# Patient Record
Sex: Female | Born: 1970
Health system: Southern US, Community
[De-identification: ages and names within clinical notes are randomized; demographics above are authoritative.]

## PROBLEM LIST (undated history)

## (undated) DIAGNOSIS — L502 Urticaria due to cold and heat: Secondary | ICD-10-CM

## (undated) DIAGNOSIS — R011 Cardiac murmur, unspecified: Secondary | ICD-10-CM

## (undated) DIAGNOSIS — J309 Allergic rhinitis, unspecified: Secondary | ICD-10-CM

## (undated) DIAGNOSIS — H1045 Other chronic allergic conjunctivitis: Secondary | ICD-10-CM

## (undated) DIAGNOSIS — E079 Disorder of thyroid, unspecified: Secondary | ICD-10-CM

## (undated) DIAGNOSIS — K219 Gastro-esophageal reflux disease without esophagitis: Secondary | ICD-10-CM

## (undated) DIAGNOSIS — J3089 Other allergic rhinitis: Secondary | ICD-10-CM

## (undated) DIAGNOSIS — L509 Urticaria, unspecified: Secondary | ICD-10-CM

## (undated) DIAGNOSIS — T7840XA Allergy, unspecified, initial encounter: Secondary | ICD-10-CM

## (undated) HISTORY — DX: Gastro-esophageal reflux disease without esophagitis: K21.9

## (undated) HISTORY — PX: MANDIBLE SURGERY: SHX707

## (undated) HISTORY — PX: NASAL SINUS SURGERY: SHX719

## (undated) HISTORY — DX: Urticaria due to cold and heat: L50.2

## (undated) HISTORY — DX: Allergic rhinitis, unspecified: J30.9

## (undated) HISTORY — DX: Disorder of thyroid, unspecified: E07.9

## (undated) HISTORY — DX: Cardiac murmur, unspecified: R01.1

## (undated) HISTORY — DX: Allergy, unspecified, initial encounter: T78.40XA

## (undated) HISTORY — DX: Urticaria, unspecified: L50.9

## (undated) HISTORY — DX: Other chronic allergic conjunctivitis: H10.45

---
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
## 1999-06-02 ENCOUNTER — Other Ambulatory Visit: Admission: RE | Admit: 1999-06-02 | Discharge: 1999-06-02 | Payer: Self-pay | Admitting: Obstetrics and Gynecology

## 1999-12-14 ENCOUNTER — Other Ambulatory Visit: Admission: RE | Admit: 1999-12-14 | Discharge: 1999-12-14 | Payer: Self-pay | Admitting: Obstetrics and Gynecology

## 2001-11-20 ENCOUNTER — Other Ambulatory Visit: Admission: RE | Admit: 2001-11-20 | Discharge: 2001-11-20 | Payer: Self-pay | Admitting: Obstetrics and Gynecology

## 2002-07-19 ENCOUNTER — Emergency Department (HOSPITAL_COMMUNITY): Admission: EM | Admit: 2002-07-19 | Discharge: 2002-07-19 | Payer: Self-pay | Admitting: Emergency Medicine

## 2002-12-18 ENCOUNTER — Other Ambulatory Visit: Admission: RE | Admit: 2002-12-18 | Discharge: 2002-12-18 | Payer: Self-pay | Admitting: Obstetrics and Gynecology

## 2003-05-29 ENCOUNTER — Other Ambulatory Visit: Admission: RE | Admit: 2003-05-29 | Discharge: 2003-05-29 | Payer: Self-pay | Admitting: Obstetrics and Gynecology

## 2003-10-14 ENCOUNTER — Other Ambulatory Visit: Admission: RE | Admit: 2003-10-14 | Discharge: 2003-10-14 | Payer: Self-pay | Admitting: Obstetrics and Gynecology

## 2003-11-11 ENCOUNTER — Inpatient Hospital Stay (HOSPITAL_COMMUNITY): Admission: AD | Admit: 2003-11-11 | Discharge: 2003-11-25 | Payer: Self-pay | Admitting: Obstetrics and Gynecology

## 2003-11-26 ENCOUNTER — Encounter: Admission: RE | Admit: 2003-11-26 | Discharge: 2003-12-26 | Payer: Self-pay | Admitting: Obstetrics and Gynecology

## 2003-12-27 ENCOUNTER — Other Ambulatory Visit: Admission: RE | Admit: 2003-12-27 | Discharge: 2003-12-27 | Payer: Self-pay | Admitting: Obstetrics and Gynecology

## 2004-04-16 ENCOUNTER — Ambulatory Visit: Payer: Self-pay | Admitting: Internal Medicine

## 2004-04-20 ENCOUNTER — Ambulatory Visit: Payer: Self-pay | Admitting: Internal Medicine

## 2004-09-10 ENCOUNTER — Ambulatory Visit: Payer: Self-pay | Admitting: Internal Medicine

## 2004-11-05 ENCOUNTER — Other Ambulatory Visit: Admission: RE | Admit: 2004-11-05 | Discharge: 2004-11-05 | Payer: Self-pay | Admitting: Obstetrics and Gynecology

## 2005-01-20 ENCOUNTER — Inpatient Hospital Stay (HOSPITAL_COMMUNITY): Admission: AD | Admit: 2005-01-20 | Discharge: 2005-01-20 | Payer: Self-pay | Admitting: Obstetrics and Gynecology

## 2005-03-16 ENCOUNTER — Ambulatory Visit: Payer: Self-pay | Admitting: Family Medicine

## 2005-04-28 ENCOUNTER — Inpatient Hospital Stay (HOSPITAL_COMMUNITY): Admission: AD | Admit: 2005-04-28 | Discharge: 2005-04-30 | Payer: Self-pay | Admitting: Obstetrics and Gynecology

## 2005-05-01 ENCOUNTER — Encounter: Admission: RE | Admit: 2005-05-01 | Discharge: 2005-05-28 | Payer: Self-pay | Admitting: Obstetrics and Gynecology

## 2005-05-29 ENCOUNTER — Encounter: Admission: RE | Admit: 2005-05-29 | Discharge: 2005-06-28 | Payer: Self-pay | Admitting: Obstetrics and Gynecology

## 2005-06-03 ENCOUNTER — Other Ambulatory Visit: Admission: RE | Admit: 2005-06-03 | Discharge: 2005-06-03 | Payer: Self-pay | Admitting: Obstetrics and Gynecology

## 2005-06-29 ENCOUNTER — Encounter: Admission: RE | Admit: 2005-06-29 | Discharge: 2005-07-28 | Payer: Self-pay | Admitting: Obstetrics and Gynecology

## 2005-07-29 ENCOUNTER — Encounter: Admission: RE | Admit: 2005-07-29 | Discharge: 2005-08-28 | Payer: Self-pay | Admitting: Obstetrics and Gynecology

## 2005-08-29 ENCOUNTER — Encounter: Admission: RE | Admit: 2005-08-29 | Discharge: 2005-09-27 | Payer: Self-pay | Admitting: Obstetrics and Gynecology

## 2005-09-28 ENCOUNTER — Encounter: Admission: RE | Admit: 2005-09-28 | Discharge: 2005-10-28 | Payer: Self-pay | Admitting: Obstetrics and Gynecology

## 2005-09-29 ENCOUNTER — Ambulatory Visit: Payer: Self-pay | Admitting: Internal Medicine

## 2005-10-29 ENCOUNTER — Encounter: Admission: RE | Admit: 2005-10-29 | Discharge: 2005-11-28 | Payer: Self-pay | Admitting: Obstetrics and Gynecology

## 2005-11-29 ENCOUNTER — Encounter: Admission: RE | Admit: 2005-11-29 | Discharge: 2005-12-02 | Payer: Self-pay | Admitting: Obstetrics and Gynecology

## 2007-09-29 ENCOUNTER — Inpatient Hospital Stay (HOSPITAL_COMMUNITY): Admission: AD | Admit: 2007-09-29 | Discharge: 2007-09-29 | Payer: Self-pay | Admitting: Obstetrics and Gynecology

## 2008-01-29 ENCOUNTER — Inpatient Hospital Stay (HOSPITAL_COMMUNITY): Admission: AD | Admit: 2008-01-29 | Discharge: 2008-02-10 | Payer: Self-pay | Admitting: Obstetrics and Gynecology

## 2008-02-08 ENCOUNTER — Encounter (INDEPENDENT_AMBULATORY_CARE_PROVIDER_SITE_OTHER): Payer: Self-pay | Admitting: Obstetrics and Gynecology

## 2008-02-11 ENCOUNTER — Encounter: Admission: RE | Admit: 2008-02-11 | Discharge: 2008-03-12 | Payer: Self-pay | Admitting: Obstetrics and Gynecology

## 2008-03-13 ENCOUNTER — Encounter: Admission: RE | Admit: 2008-03-13 | Discharge: 2008-04-12 | Payer: Self-pay | Admitting: Obstetrics and Gynecology

## 2008-04-13 ENCOUNTER — Encounter: Admission: RE | Admit: 2008-04-13 | Discharge: 2008-05-06 | Payer: Self-pay | Admitting: Obstetrics and Gynecology

## 2008-07-17 ENCOUNTER — Ambulatory Visit: Payer: Self-pay | Admitting: Family Medicine

## 2008-07-17 DIAGNOSIS — B351 Tinea unguium: Secondary | ICD-10-CM | POA: Insufficient documentation

## 2008-07-17 DIAGNOSIS — Z6828 Body mass index (BMI) 28.0-28.9, adult: Secondary | ICD-10-CM | POA: Insufficient documentation

## 2008-07-30 ENCOUNTER — Ambulatory Visit: Payer: Self-pay | Admitting: Family Medicine

## 2008-07-31 LAB — CONVERTED CEMR LAB
AST: 17 units/L (ref 0–37)
Alkaline Phosphatase: 75 units/L (ref 39–117)
BUN: 9 mg/dL (ref 6–23)
CO2: 29 meq/L (ref 19–32)
Glucose, Bld: 86 mg/dL (ref 70–99)
Potassium: 4.1 meq/L (ref 3.5–5.1)
Total Protein: 6.9 g/dL (ref 6.0–8.3)

## 2009-01-08 ENCOUNTER — Ambulatory Visit: Payer: Self-pay | Admitting: Family Medicine

## 2009-09-24 ENCOUNTER — Ambulatory Visit: Payer: Self-pay | Admitting: Family Medicine

## 2009-09-24 DIAGNOSIS — G8929 Other chronic pain: Secondary | ICD-10-CM | POA: Insufficient documentation

## 2009-09-24 DIAGNOSIS — R1013 Epigastric pain: Secondary | ICD-10-CM

## 2009-09-25 ENCOUNTER — Ambulatory Visit: Payer: Self-pay | Admitting: Family Medicine

## 2009-09-25 DIAGNOSIS — R1011 Right upper quadrant pain: Secondary | ICD-10-CM | POA: Insufficient documentation

## 2009-09-25 LAB — CONVERTED CEMR LAB
AST: 27 units/L (ref 0–37)
BUN: 10 mg/dL (ref 6–23)
Basophils Relative: 0.5 % (ref 0.0–3.0)
Bilirubin, Direct: 0.1 mg/dL (ref 0.0–0.3)
CO2: 27 meq/L (ref 19–32)
Calcium: 8.9 mg/dL (ref 8.4–10.5)
Chloride: 107 meq/L (ref 96–112)
Creatinine, Ser: 0.5 mg/dL (ref 0.4–1.2)
Eosinophils Absolute: 0.2 10*3/uL (ref 0.0–0.7)
Glucose, Bld: 83 mg/dL (ref 70–99)
Hemoglobin: 13 g/dL (ref 12.0–15.0)
Monocytes Relative: 6.3 % (ref 3.0–12.0)
Potassium: 4 meq/L (ref 3.5–5.1)
RDW: 12.7 % (ref 11.5–14.6)
Sodium: 139 meq/L (ref 135–145)
Total Bilirubin: 0.7 mg/dL (ref 0.3–1.2)
Total Protein: 6.5 g/dL (ref 6.0–8.3)

## 2009-09-26 ENCOUNTER — Encounter: Admission: RE | Admit: 2009-09-26 | Discharge: 2009-09-26 | Payer: Self-pay | Admitting: Family Medicine

## 2010-03-29 ENCOUNTER — Encounter: Payer: Self-pay | Admitting: Obstetrics and Gynecology

## 2010-03-30 ENCOUNTER — Encounter: Payer: Self-pay | Admitting: Obstetrics and Gynecology

## 2010-04-09 NOTE — Assessment & Plan Note (Signed)
Summary: BACK PAIN/DLO   Vital Signs:  Patient profile:   40 year old female Height:      66 inches Weight:      185.6 pounds BMI:     30.06 Temp:     98.7 degrees F oral Pulse rate:   84 / minute Pulse rhythm:   regular BP sitting:   110 / 78  (left arm) Cuff size:   large  Vitals Entered By: Benny Lennert CMA Duncan Dull) (September 24, 2009 10:01 AM)  History of Present Illness: Chief complaint Back Pain and upper abdominal pain  1 week ago.. right upper back pain... started after show, but no specific injury...was reaching up some. Using ibuprofen helps some, but temporarily.  Constant  Has also had some upper  abdominal pressure and gas...points to up under central and right rib cage... intermittant  Some feeling of difficulty swallowing when having pain. Notes worse after eating. Had similar..pain in centeral upper abd...2 months ago but went away. Started before she started ibuprofen. Tried pepcid..no improvement     Problems Prior to Update: 1)  Ruq Pain  (ICD-789.01) 2)  Pharyngitis, Streptococcal  (ICD-034.0) 3)  Screening For Lipoid Disorders  (ICD-V77.91) 4)  Obesity, Unspecified  (ICD-278.00) 5)  Onychomycosis, Toenails  (ICD-110.1)  Current Medications (verified): 1)  None  Allergies: 1)  ! Keflex 2)  ! Ceftin  Past History:  Past medical, surgical, family and social histories (including risk factors) reviewed, and no changes noted (except as noted below).  Past Surgical History: Reviewed history from 07/17/2008 and no changes required. nasal polyps and deviated septum surgery 1989  Family History: Reviewed history from 07/17/2008 and no changes required. father: CAD, DM, HTN, high chol mother: DM, HTN, high chol brother: DM, HTN MGF: lymphoma MGM: aneurysm  Social History: Reviewed history from 07/17/2008 and no changes required. 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 Alcohol use-no Drug use-no Regular exercise-no Diet: fruits and veggies, water  Review of Systems General:  Denies fatigue and fever. ENT:  Denies ear discharge, earache, nasal congestion, sinus pressure, and sore throat. CV:  Denies chest pain or discomfort. Resp:  Denies shortness of breath. GI:  Complains of constipation and indigestion; denies bloody stools, diarrhea, nausea, vomiting, and vomiting blood; some indigestion. GU:  Denies dysuria, hematuria, urinary frequency, and urinary hesitancy.  Physical Exam  General:  Well-developed,well-nourished,in no acute distress; alert,appropriate and cooperative throughout examination Mouth:  Oral mucosa and oropharynx without lesions or exudates.  Teeth in good repair. Neck:  no carotid bruit or thyromegaly cervical lymphadenopathy.   Lungs:  Normal respiratory effort, chest expands symmetrically. Lungs are clear to auscultation, no crackles or wheezes. Heart:  Normal rate and regular rhythm. S1 and S2 normal without gallop, murmur, click, rub or other extra sounds. Abdomen:  Bowel sounds positive,abdomen soft and mild epigastric  tenderness, non tender over gallbladder without masses, organomegaly or hernias noted.  NO CVA ttp.  Msk:  ttp over right upper trapezius, no cetral vertebral pain Neurologic:  No cranial nerve deficits noted. Station and gait are normal. Sensory, motor and coordinative functions appear intact.   Impression & Recommendations:  Problem # 1:  EPIGASTRIC PAIN (ICD-789.06) Will eval with labs...start PPI.Marland Kitchenif continues will consider gallbladder eval. Orders: TLB-BMP (Basic Metabolic Panel-BMET) (80048-METABOL) TLB-CBC Platelet - w/Differential (85025-CBCD) TLB-Hepatic/Liver Function Pnl (80076-HEPATIC) TLB-Lipase (83690-LIPASE)  Problem # 2:  MUSCLE SPASM, TRAPEZIUS (ICD-728.85) Treat with tylenol,  gentle stretching, Heat and muscle relaxant as needed. Follow up if otimproving.   Complete  Medication List: 1)  Cyclobenzaprine Hcl 5 Mg Tabs (Cyclobenzaprine hcl) .Marland Kitchen.. 1-2 tab by mouth at bedtime  Patient Instructions: 1)  We will call you with the lab results. 2)   Start 2 tab 20 mg prilosec. 3)  Heat on right upper back, gentle stretching upoper back. 4)  Use tylenol for pain. 5)   MAy use muscle relaxant at night if needed.  6)  Call if pain is worsening.Marland Kitchengo to ER if severe pain. Prescriptions: CYCLOBENZAPRINE HCL 5 MG TABS (CYCLOBENZAPRINE HCL) 1-2 tab by mouth at bedtime  #30 x 0   Entered and Authorized by:   Kerby Nora MD   Signed by:   Kerby Nora MD on 09/24/2009   Method used:   Print then Give to Patient   RxID:   [id number]   Current Allergies (reviewed today): ! [facility] ! CEFTIN

## 2010-04-09 NOTE — Assessment & Plan Note (Signed)
Summary: not feeling any better from dr Ermalene Searing 7/20/rbh   Vital Signs:  Patient profile:   40 year old female Height:      66 inches Weight:      183.4 pounds BMI:     29.71 Temp:     98.6 degrees F oral Pulse rate:   84 / minute Pulse rhythm:   regular BP sitting:   110 / 70  (left arm) Cuff size:   regular  Vitals Entered By: Benny Lennert CMA Duncan Dull) (September 25, 2009 12:38 PM)  History of Present Illness: Chief complaint upper abdomen pain  40 year old female who presents with abd pain and shoulder upper back pain, with the abd pain ongoing for 1 week. Nontoxic, able to eat. Has had some occ gas.  Pressure and pain in the right upper quandrant.  Also has been having a lot of gas and constipation and nothing has come up.   Shoulder, additionally has R upper shoulder discomfort posterior, mostly focally in the trap / rhomboid area  Reviewed all labs and below, as well, lipase normal.  Clinical Review Panels:  CBC   WBC:  9.5 (09/24/2009)   RBC:  4.06 (09/24/2009)   Hgb:  13.0 (09/24/2009)   Hct:  38.2 (09/24/2009)   Platelets:  346.0 (09/24/2009)   MCV  94.0 (09/24/2009)   MCHC  34.0 (09/24/2009)   RDW  12.7 (09/24/2009)   PMN:  65.5 (09/24/2009)   Lymphs:  25.5 (09/24/2009)   Monos:  6.3 (09/24/2009)   Eosinophils:  2.2 (09/24/2009)   Basophil:  0.5 (09/24/2009)  Complete Metabolic Panel   Glucose:  83 (09/24/2009)   Sodium:  139 (09/24/2009)   Potassium:  4.0 (09/24/2009)   Chloride:  107 (09/24/2009)   CO2:  27 (09/24/2009)   BUN:  10 (09/24/2009)   Creatinine:  0.5 (09/24/2009)   Albumin:  3.8 (09/24/2009)   Total Protein:  6.5 (09/24/2009)   Calcium:  8.9 (09/24/2009)   Total Bili:  0.7 (09/24/2009)   Alk Phos:  84 (09/24/2009)   SGPT (ALT):  31 (09/24/2009)   SGOT (AST):  27 (09/24/2009)   Allergies: 1)  ! Keflex 2)  ! Ceftin  Past History:  Past medical, surgical, family and social histories (including risk factors) reviewed, and no changes  noted (except as noted below).  Past Surgical History: Reviewed history from 07/17/2008 and no changes required. nasal polyps and deviated septum surgery 1989   Family History: Reviewed history from 07/17/2008 and no changes required. father: CAD, DM, HTN, high chol mother: DM, HTN, high chol brother: DM, HTN MGF: lymphoma MGM: aneurysm  Social History: Reviewed history from 07/17/2008 and no changes required. 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 Alcohol use-no Drug use-no Regular exercise-no Diet: fruits and veggies, water  Review of Systems  The patient denies fever, chest pain, dyspnea on exertion, peripheral edema, and prolonged cough.    Physical Exam  General:  Well-developed,well-nourished,in no acute distress; alert,appropriate and cooperative throughout examination Head:  Normocephalic and atraumatic without obvious abnormalities. No apparent alopecia or balding. Ears:  no external deformities.   Nose:  no external deformity.   Neck:  No deformities, masses, or tenderness noted. Lungs:  Normal respiratory effort, chest expands symmetrically. Lungs are clear to auscultation, no crackles or wheezes. Heart:  Normal rate and regular rhythm. S1 and S2 normal without gallop, murmur, click, rub or other extra sounds.  Abdomen:  Mildly tender RUQ, without rebound. No distension, no rigidity. soft, normal bowel sounds, no rebound tenderness, no abdominal hernia, no inguinal hernia, no hepatomegaly, and no splenomegaly.   Extremities:  No clubbing, cyanosis, edema, or deformity noted with normal full range of motion of all joints.   Neurologic:  alert & oriented X3 and gait normal.   Cervical Nodes:  No lymphadenopathy noted Psych:  Cognition and judgment appear intact. Alert and cooperative with normal attention span and concentration. No apparent delusions, illusions,  hallucinations   Impression & Recommendations:  Problem # 1:  RUQ PAIN (ICD-789.01) Exam nontoxic but with risk factors could be gallbladder and most prudent to check RUQ [place] to eval gallbladder.   Labs all reviewed, encouraging.    Orders: Radiology Referral (Radiology)  Problem # 2:  MUSCLE SPASM, TRAPEZIUS (ICD-728.85) scapular dyskinesis, reviewed with the patient.  lower scapular stabilizers are notably weak with easily seen winging.  reviewed basic str of this areas with her  Complete Medication List: 1)  Vitamin D 400 Unit Tabs (Cholecalciferol) .... One daily 2)  Zyrtec Allergy 10 Mg Tabs (Cetirizine hcl) .... Otc one daily as needed for allergies 3)  Terbinafine Hcl 250 Mg Tabs (Terbinafine hcl) .... Take 1 tablet by mouth once a day x 12 weeks 4)  Amoxicillin 500 Mg Caps (Amoxicillin) .... 2 tab by mouth two times a day x 10 days 5)  Cyclobenzaprine Hcl 5 Mg Tabs (Cyclobenzaprine hcl) .Marland Kitchen.. 1-2 tab by mouth at bedtime  Patient Instructions: 1)  Referral Appointment Information 2)  Day/Date: 3)  Time: 4)  Place/MD: 5)  Address: 6)  Phone/Fax: 7)  Patient given appointment information. Information/Orders faxed/mailed.   Current Allergies (reviewed today): ! KEFLEX ! CEFTIN

## 2010-05-13 ENCOUNTER — Encounter: Payer: Self-pay | Admitting: Family Medicine

## 2010-05-13 ENCOUNTER — Ambulatory Visit (INDEPENDENT_AMBULATORY_CARE_PROVIDER_SITE_OTHER): Payer: PRIVATE HEALTH INSURANCE | Admitting: Family Medicine

## 2010-05-13 DIAGNOSIS — R1013 Epigastric pain: Secondary | ICD-10-CM

## 2010-05-19 NOTE — Assessment & Plan Note (Signed)
Summary: GAS PAIN/CLE   ASSURANT   Vital Signs:  Patient profile:   40 year old female Weight:      192.75 pounds Temp:     98.1 degrees F oral Pulse rate:   84 / minute Pulse rhythm:   regular BP sitting:   126 / 80  (left arm) Cuff size:   large  Vitals Entered By: Selena Batten Dance CMA Duncan Dull) (May 13, 2010 11:51 AM) CC: Gas pain x2 weeks   History of Present Illness: CC: abd pain  2wk h/o epigastric abd discomfort, worse with any food.  drinking soda and burping or flatus or BM relieves pain.  Feels pressure sensation in abd.  Eating less and more often to help.  + mild nausea with this.  No significant pain.  feeling faint as well.  No LOC.  has tried gas x but didn't really help.  no radiation of pain.  No weight changes, fevers/chills, vomiting, diarrhea.  mild constipation.  No changes in diet recently.  no sick contacts.  started prilosec 20mg  for 5 days now.  doesn't seem to be helping.  similar thing happened 1 1/2 years ago (along with pulled muscle in back).  resolved with time and omeprazole.  abd US wnl back then  Current Medications (verified): 1)  Prilosec Otc 20 Mg Tbec (Omeprazole Magnesium) .Marland Kitchen.. 1 By Mouth Once Daily  Allergies: 1)  ! Keflex 2)  ! 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 Alcohol use-no Drug use-no Regular exercise-no Diet: fruits and veggies, water  Review of Systems       per HPI  Physical Exam  General:  Well-developed,well-nourished,in no acute distress; alert,appropriate and cooperative throughout examination Mouth:  Oral mucosa and oropharynx without lesions or exudates.  Teeth in good repair. Neck:  No deformities, masses, or tenderness noted. Lungs:  Normal respiratory effort, chest expands symmetrically. Lungs are clear to auscultation, no crackles or  wheezes. Heart:  Normal rate and regular rhythm. S1 and S2 normal without gallop, murmur, click, rub or other extra sounds. Abdomen:  Bowel sounds positive,abdomen soft and without masses, organomegaly or hernias noted.  mild epigastric tenderness to deep palpation.  no RUQ pain, no masses at gallbladde.r Pulses:  2+ rad pulses bilaterally Extremities:  no pedal edema   Impression & Recommendations:  Problem # 1:  EPIGASTRIC PAIN (ICD-789.06) dyspepsia.  Sounds gastritis vs GERD related. increase omeprazole to 40 daily. gerd precautions discussed update if not better for further eval.  Complete Medication List: 1)  Omeprazole 40 Mg Cpdr (Omeprazole) .... Take one daily for 3 wks, falied 20mg   Patient Instructions: 1)  this could be some gastritis or reflux or just indigestion. 2)  Take prilosec 40mg  daily (prescription sent in) for 3 weeks. 3)  start beano as well. 4)  Avoidance of citrus, fatty foods, chocolate, peppermint, and excessive alcohol, along with sodas, orange juice (acidic drinks) should help. 5)  At least a few hours between dinner and bed, minimize naps after eating. 6)  return if not better with this treatment or any worsening. Prescriptions: OMEPRAZOLE 40 MG CPDR (OMEPRAZOLE) take one daily for 3 wks, falied 20mg   #30 x 0   Entered and Authorized by:   Eustaquio Boyden  MD   Signed by:   Eustaquio Boyden  MD on 05/13/2010   Method used:  Electronically to        [facility]  [address] [id number] (retail)       [address]       [place], [place]  [address]       Ph: [phone] or [phone]       Fax: [phone]   RxID:   [id number]    Orders Added: 1)  Est. Patient Level III [06301]    Current Allergies (reviewed today): ! [facility] ! CEFTIN

## 2010-07-21 NOTE — Discharge Summary (Signed)
[name], Emily Mclean            ACCOUNT NO.:  [id number]   MEDICAL RECORD NO.:  [id number]          PATIENT TYPE:  INP   LOCATION:  9319                          FACILITY:  [facility]   PHYSICIAN:  Dineen Kid. Rana Snare, M.D.    DATE OF BIRTH:  Jan 10, 1971   DATE OF ADMISSION:  01/29/2008  DATE OF DISCHARGE:  02/10/2008                               DISCHARGE SUMMARY   ADMITTING DIAGNOSES:  1. Intrauterine pregnancy at 29-6/7th weeks estimated gestational age.  2. Preterm premature rupture of membranes.   DISCHARGE DIAGNOSES:  1. Status post spontaneous vaginal delivery.  2. Viable female infant.   PROCEDURE:  Spontaneous vaginal delivery.   REASON FOR ADMISSION:  Please see written H&P.   HOSPITAL COURSE:  The patient is a 40 year old gravida 3, para 2 that  presents to [facility] at 29-6/7th weeks estimated  gestational age with leakage of amniotic fluid.  The patient was  admitted to [facility] and diagnosis of preterm  premature rupture of membranes was confirmed.  Pregnancy had been,  otherwise, uncomplicated with exception of advanced maternal age with  normal first trimester screening.  The patient's history was significant  for history of preterm delivery x2 at 35-1/2 weeks.  On admission, vital  signs were stable.  Fetal heart tones in the 140s with acceleration.  Contractions were noted to be very rare.  Cervix was noted with sterile  speculum exam to be closed and positive for ferning and pulling.  The  patient was now admitted for bedrest IV antibiotics betamethasone  administration.  The patient was started on magnesium sulfate or preterm  labor prophylaxis, and maternal fetal medicine consult was made.  The  patient was started on betamethasone for fetal lung maturity  enhancement.  She continued to do well with rare contractions.  At  approximately 31-2/7th weeks, the patient did start into spontaneous  labor.  She was given IV antibiotics.  An  epidural was placed for her  comfort.  The patient did deliver spontaneously a viable female infant  weighing 3 pounds 13 ounces over an intact perineum.  Placenta was  removed manually and fragments in uterus was explored without any  further fragments noted.  Estimated blood loss was 350 mL.  Baby did  well and was transferred to the NICU.  On postpartum day #1, the patient  was doing well.  Vital signs were stable.  She was afebrile.  Hemoglobin  was 11.1.  Fundus was firm and nontender.  WBCs were noted to be  somewhat elevated at 19.5.  On the following morning, the patient did  desire discharge.  Baby was doing well in the NICU.  Vital signs were  stable.  She remained afebrile.  Fundus was firm and nontender.  Perineum was intact.  Laboratory findings showed hemoglobin of 10.9 and  WBC count was down to 15.8.  Discharge instructions were reviewed and  the patient was later discharged to home.   CONDITION ON DISCHARGE:  Stable.   DIET:  Regular as tolerated.   ACTIVITY:  No vaginal entry; otherwise, the patient up as tolerated.  She is to call for temperature greater than 100.5, heavy vaginal  bleeding, or clotting.   DISCHARGE MEDICATIONS:  1. Tylox #30 one p.o. every 4-6 hours.  2. Motrin 600 mg every 6 hours.  3. Prenatal vitamins 1 p.o. daily.  4. Colace 1 p.o. daily p.r.n.      Julio Sicks, N.P.      Dineen Kid Rana Snare, M.D.  Electronically Signed    CC/MEDQ  D:  03/12/2008  T:  03/12/2008  Job:  [id number]

## 2010-07-24 NOTE — Discharge Summary (Signed)
NAMERONDI, IVY            ACCOUNT NO.:  [id number]   MEDICAL RECORD NO.:  [id number]          PATIENT TYPE:  INP   LOCATION:  9114                          FACILITY:  [facility]   PHYSICIAN:  Dineen Kid. Rana Snare, M.D.    DATE OF BIRTH:  06-Jan-1971   DATE OF ADMISSION:  11/11/2003  DATE OF DISCHARGE:  11/25/2003                                 DISCHARGE SUMMARY   ADMITTING DIAGNOSES:  1.  Intrauterine pregnancy at 33-5/7 weeks.  2.  Preterm premature rupture of membranes.   DISCHARGE DIAGNOSES:  1.  Spontaneous vaginal delivery.  2.  Viable female infant.   PROCEDURES:  Spontaneous vaginal delivery with first degree laceration with  repair.   REASON FOR ADMISSION:  Please see written H&P.   HOSPITAL COURSE:  Patient is a 40 year old white married female primigravida  that was admitted to [facility] at 33-5/7 weeks with preterm  premature rupture of membranes.  On admission cervix was dilated 2 cm, 90%  effaced, vertex at a -1 station, contractions were every 3-5 minutes, fetal  heart tones were reactive and patient was afebrile.  Patient was placed on  strict bed rest, given IV fluids and started on IV antibiotics, ultrasound  was performed.  The patient continued to leak fluid, fetal heart tones  remained reactive with no decelerations.  Decision was to continue with bed  rest, continue IV antibiotics, administer betamethasone to enhance lung  maturity.  Patient continued to be afebrile with uterus nontender and fetal  heart tones reactive.  She was carefully monitored until November 23, 2003  when patient's labor began spontaneously, fetal heart tones remained  reactive, contractions were now every 2 minutes, cervix was noted to be 4 cm  dilated, completely effaced at a 0 station.  NICU was notified of patient's  impending delivery and patient was subsequently delivered on November 23, 2003 a spontaneous vaginal delivery of a viable female infant weighing 5  pounds  9 ounces with Apgars of 8 at one minute, 9 at five minutes.  Patient  had a first degree laceration which was closed with 3-0 chromic, estimated  blood loss was 300 mL.  The following morning patient was doing well, her  vital signs were stable, she was afebrile, fundus was firm and nontender.  On postpartum day #2 patient was without complaint, baby was doing well and  was also being discharged on the same day, vital signs remained stable, she  was afebrile, fundus firm and nontender, discharge hemoglobin was 11.5,  instructions were given and patient was discharged home.   CONDITION ON DISCHARGE:  Good.   DIET:  Regular as tolerated.   ACTIVITY:  No heavy lifting.  No driving x2 weeks.  No vaginal entry.   FOLLOW UP:  Patient is to return to the office in 4-6 weeks.  She is to call  for temperature greater than 100 degrees or heavy vaginal bleeding.   DISCHARGE MEDICATIONS:  1.  Motrin 600 mg every 6 hours.  2.  Prenatal vitamins p.o. daily.  3.  Colace one p.o. daily p.r.n.  CC/MEDQ  D:  12/21/2003  T:  12/21/2003  Job:  [id number]

## 2010-12-08 LAB — COMPREHENSIVE METABOLIC PANEL
ALT: 11 U/L (ref 0–35)
Albumin: 3 g/dL — ABNORMAL LOW (ref 3.5–5.2)
BUN: 3 mg/dL — ABNORMAL LOW (ref 6–23)
CO2: 23 mEq/L (ref 19–32)
Chloride: 105 mEq/L (ref 96–112)
GFR calc non Af Amer: >60 mL/min (ref >60)
Glucose, Bld: 76 mg/dL (ref 70–99)
Sodium: 136 mEq/L (ref 135–145)

## 2010-12-08 LAB — STREP B DNA PROBE: Strep Group B Ag: NEGATIVE

## 2010-12-08 LAB — CBC
HCT: 34.2 % — ABNORMAL LOW (ref 36.0–46.0)
Hemoglobin: 11.9 g/dL — ABNORMAL LOW (ref 12.0–15.0)
MCV: 97.6 fL (ref 78.0–100.0)

## 2010-12-11 LAB — CBC
Hemoglobin: 11.1 g/dL — ABNORMAL LOW (ref 12.0–15.0)
MCHC: 34.7 g/dL (ref 30.0–36.0)
MCHC: 34.9 g/dL (ref 30.0–36.0)
MCV: 96.8 fL (ref 78.0–100.0)
MCV: 97.3 fL (ref 78.0–100.0)
MCV: 97.8 fL (ref 78.0–100.0)
RBC: 3.2 MIL/uL — ABNORMAL LOW (ref 3.87–5.11)
RBC: 3.47 MIL/uL — ABNORMAL LOW (ref 3.87–5.11)
RDW: 12.9 % (ref 11.5–15.5)
WBC: 20.1 10*3/uL — ABNORMAL HIGH (ref 4.0–10.5)

## 2012-02-21 ENCOUNTER — Other Ambulatory Visit: Payer: Self-pay | Admitting: Obstetrics and Gynecology

## 2012-11-02 ENCOUNTER — Ambulatory Visit (INDEPENDENT_AMBULATORY_CARE_PROVIDER_SITE_OTHER): Payer: Commercial Managed Care - PPO | Admitting: Internal Medicine

## 2012-11-02 ENCOUNTER — Telehealth: Payer: Self-pay

## 2012-11-02 ENCOUNTER — Encounter: Payer: Self-pay | Admitting: Internal Medicine

## 2012-11-02 VITALS — BP 108/76 | HR 76 | Temp 98.8°F | Ht 65.0 in | Wt 193.0 lb

## 2012-11-02 DIAGNOSIS — H109 Unspecified conjunctivitis: Secondary | ICD-10-CM

## 2012-11-02 MED ORDER — POLYMYXIN B-TRIMETHOPRIM 10000-0.1 UNIT/ML-% OP SOLN: 1.0000 [drp] | OPHTHALMIC | Status: DC

## 2012-11-02 NOTE — Progress Notes (Signed)
Subjective:    Patient ID: Emily Mclean, female    DOB: [birth year], 42 y.o.   MRN: [id number]  HPI  Pt presents to the clinic today with c/o redness, swelling and drainage from both eyes. This started yesterday. The drainage from her eyes is green and her eyes are crusted over in the morning. She denies fever, chills or body aches. She does have a history of allergies and she takes allegra for that. She reports that her allergies are well controlled. Her daughter was also recently diagnosed with pink eye.  Review of Systems      History reviewed. No pertinent past medical history.  No current outpatient prescriptions on file.   No current facility-administered medications for this visit.    Allergies  Allergen Reactions  . Cefuroxime Axetil     REACTION: red rash  . Cephalexin     REACTION: red rash    History reviewed. No pertinent family history.  History   Social History  . Marital Status: Married    Spouse Name: N/A    Number of Children: N/A  . Years of Education: N/A   Occupational History  . Not on file.   Social History Main Topics  . Smoking status: Not on file  . Smokeless tobacco: Not on file  . Alcohol Use: Not on file  . Drug Use: Not on file  . Sexual Activity: Not on file   Other Topics Concern  . Not on file   Social History Narrative  . No narrative on file     Constitutional: Denies fever, malaise, fatigue, headache or abrupt weight changes.  HEENT: Pt reports redness, swelling and drainage of eyes. Denies eye pain, ear pain, ringing in the ears, wax buildup, runny nose, nasal congestion, bloody nose, or sore throat. Skin: Denies redness, rashes, lesions or ulcercations.   No other specific complaints in a complete review of systems (except as listed in HPI above).  Objective:   Physical Exam   BP 108/76  Pulse 76  Temp(Src) 98.8 F (37.1 C) (Oral)  Ht 5\' 5"  (1.651 m)  Wt 193 lb (87.544 kg)  BMI 32.12 kg/m2 Wt Readings  from Last 3 Encounters:  11/02/12 193 lb (87.544 kg)  05/13/10 192 lb 12 oz (87.431 kg)  09/25/09 183 lb 6.4 oz (83.19 kg)    General: Appears her stated age, well developed, well nourished in NAD. Skin: Warm, dry and intact. No rashes, lesions or ulcerations noted. HEENT: Head: normal shape and size; Eyes: sclera injected, no icterus, conjunctiva erythematous, PERRLA and EOMs intact; Ears: Tm's gray and intact, normal light reflex; Nose: mucosa pink and moist, septum midline; Throat/Mouth: Teeth present, mucosa pink and moist, no exudate, lesions or ulcerations noted.    BMET    Component Value Date/Time   NA 139 09/24/2009 1102   K 4.0 09/24/2009 1102   CL 107 09/24/2009 1102   CO2 27 09/24/2009 1102   GLUCOSE 83 09/24/2009 1102   BUN 10 09/24/2009 1102   CREATININE 0.5 09/24/2009 1102   CALCIUM 8.9 09/24/2009 1102   GFRNONAA 135.21 09/24/2009 1102   GFRAA  Value: >60        The eGFR has been calculated using the MDRD equation. This calculation has not been validated in all clinical 01/29/2008 1027    Lipid Panel     Component Value Date/Time   CHOL 163 07/30/2008 0910   TRIG 87.0 07/30/2008 0910   HDL 40.60 07/30/2008 0910   CHOLHDL 4  07/30/2008 0910   VLDL 17.4 07/30/2008 0910   LDLCALC 105* 07/30/2008 0910    CBC    Component Value Date/Time   WBC 9.5 09/24/2009 1102   RBC 4.06 09/24/2009 1102   HGB 13.0 09/24/2009 1102   HCT 38.2 09/24/2009 1102   PLT 346.0 09/24/2009 1102   MCV 94.0 09/24/2009 1102   MCHC 34.0 09/24/2009 1102   RDW 12.7 09/24/2009 1102   LYMPHSABS 2.4 09/24/2009 1102   MONOABS 0.6 09/24/2009 1102   EOSABS 0.2 09/24/2009 1102   BASOSABS 0.0 09/24/2009 1102    Hgb A1C No results found for this basename: HGBA1C        Assessment & Plan:   Conjunctivitis of both eyes, new onset:  Warm compresses and Ibuprofen as needed for pain/inflammation eRx of Polytrim eye drop Q4H for 24-48 hours Wash your hands before and after applying the eye drops  RTC as needed  or if symptoms persist or worsen

## 2012-11-02 NOTE — Telephone Encounter (Signed)
pts daughter diagnosed pink eye; for 2 days both pt's eyes have been red, draining and sore, no fever. Dr Ermalene Searing not available;pt scheduled with Nicki Reaper, NP today at 2 pm.

## 2012-11-02 NOTE — Patient Instructions (Signed)

## 2012-12-11 ENCOUNTER — Other Ambulatory Visit: Payer: Commercial Managed Care - PPO

## 2012-12-11 ENCOUNTER — Telehealth: Payer: Self-pay | Admitting: Family Medicine

## 2012-12-11 DIAGNOSIS — Z13220 Encounter for screening for lipoid disorders: Secondary | ICD-10-CM

## 2012-12-11 NOTE — Telephone Encounter (Signed)
Message copied by Excell Seltzer on Mon Dec 11, 2012  8:05 AM ------      Message from: Alvina Chou      Created: Fri Dec 01, 2012  4:21 PM      Regarding: Lab orders for, Monday, 10.6.14       Patient is scheduled for CPX labs, please order future labs, Thanks , Terri       ------

## 2012-12-14 ENCOUNTER — Encounter: Payer: Commercial Managed Care - PPO | Admitting: Family Medicine

## 2012-12-14 DIAGNOSIS — Z0289 Encounter for other administrative examinations: Secondary | ICD-10-CM

## 2013-01-01 ENCOUNTER — Emergency Department (HOSPITAL_COMMUNITY)
Admission: EM | Admit: 2013-01-01 | Discharge: 2013-01-01 | Disposition: A | Discharge status: Home / Self Care | Payer: 59 | Attending: Emergency Medicine | Admitting: Emergency Medicine

## 2013-01-01 ENCOUNTER — Other Ambulatory Visit: Payer: Self-pay

## 2013-01-01 ENCOUNTER — Emergency Department (HOSPITAL_COMMUNITY): Payer: 59

## 2013-01-01 ENCOUNTER — Encounter (HOSPITAL_COMMUNITY): Payer: Self-pay | Admitting: Emergency Medicine

## 2013-01-01 DIAGNOSIS — Z79899 Other long term (current) drug therapy: Secondary | ICD-10-CM | POA: Insufficient documentation

## 2013-01-01 DIAGNOSIS — R072 Precordial pain: Secondary | ICD-10-CM | POA: Insufficient documentation

## 2013-01-01 DIAGNOSIS — R0602 Shortness of breath: Secondary | ICD-10-CM | POA: Insufficient documentation

## 2013-01-01 DIAGNOSIS — Z888 Allergy status to other drugs, medicaments and biological substances status: Secondary | ICD-10-CM | POA: Insufficient documentation

## 2013-01-01 DIAGNOSIS — R079 Chest pain, unspecified: Secondary | ICD-10-CM

## 2013-01-01 DIAGNOSIS — Z881 Allergy status to other antibiotic agents status: Secondary | ICD-10-CM | POA: Insufficient documentation

## 2013-01-01 DIAGNOSIS — R011 Cardiac murmur, unspecified: Secondary | ICD-10-CM | POA: Insufficient documentation

## 2013-01-01 DIAGNOSIS — Z8679 Personal history of other diseases of the circulatory system: Secondary | ICD-10-CM | POA: Insufficient documentation

## 2013-01-01 DIAGNOSIS — Z7982 Long term (current) use of aspirin: Secondary | ICD-10-CM | POA: Insufficient documentation

## 2013-01-01 DIAGNOSIS — M25519 Pain in unspecified shoulder: Secondary | ICD-10-CM | POA: Insufficient documentation

## 2013-01-01 DIAGNOSIS — Z87891 Personal history of nicotine dependence: Secondary | ICD-10-CM | POA: Insufficient documentation

## 2013-01-01 DIAGNOSIS — R110 Nausea: Secondary | ICD-10-CM | POA: Insufficient documentation

## 2013-01-01 LAB — URINALYSIS, ROUTINE W REFLEX MICROSCOPIC
Bilirubin Urine: NEGATIVE
Hgb urine dipstick: NEGATIVE
Ketones, ur: NEGATIVE mg/dL
Leukocytes, UA: NEGATIVE
Protein, ur: NEGATIVE mg/dL

## 2013-01-01 LAB — D-DIMER, QUANTITATIVE: D-Dimer, Quant: <0.27 ug/mL-FEU (ref 0.00–0.48)

## 2013-01-01 LAB — POCT I-STAT, CHEM 8
BUN: 4 mg/dL — ABNORMAL LOW (ref 6–23)
Calcium, Ion: 1.17 mmol/L (ref 1.12–1.23)
Chloride: 105 mEq/L (ref 96–112)
Glucose, Bld: 121 mg/dL — ABNORMAL HIGH (ref 70–99)
HCT: 46 % (ref 36.0–46.0)
Sodium: 142 mEq/L (ref 135–145)
TCO2: 22 mmol/L (ref 0–100)

## 2013-01-01 LAB — CBC
MCV: 90.6 fL (ref 78.0–100.0)
Platelets: 354 10*3/uL (ref 150–400)
RDW: 12.5 % (ref 11.5–15.5)

## 2013-01-01 LAB — POCT I-STAT TROPONIN I: Troponin i, poc: 0 ng/mL (ref 0.00–0.08)

## 2013-01-01 MED ORDER — ASPIRIN 81 MG PO CHEW: 81.0000 mg | CHEWABLE_TABLET | Freq: Every day | ORAL | Status: DC

## 2013-01-01 MED ORDER — ASPIRIN 81 MG PO CHEW
324.0000 mg | CHEWABLE_TABLET | Freq: Once | ORAL | Status: AC
  Administered 2013-01-01: 324 mg via ORAL
  Filled 2013-01-01: qty 4

## 2013-01-01 MED ORDER — FAMOTIDINE 20 MG PO TABS: 20.0000 mg | ORAL_TABLET | Freq: Two times a day (BID) | ORAL | Status: DC

## 2013-01-01 MED ORDER — ASPIRIN 81 MG PO CHEW
81.0000 mg | CHEWABLE_TABLET | Freq: Once | ORAL | Status: DC
  Filled 2013-01-01: qty 1

## 2013-01-01 MED ORDER — TRAMADOL HCL 50 MG PO TABS: 50.0000 mg | ORAL_TABLET | Freq: Four times a day (QID) | ORAL | Status: DC | PRN

## 2013-01-01 NOTE — ED Provider Notes (Signed)
CSN: [id number]     Arrival date & time 01/01/13  1136 History   First MD Initiated Contact with Patient 01/01/13 1145     Chief Complaint  Patient presents with  . Chest Pain   (Consider location/radiation/quality/duration/timing/severity/associated sxs/prior Treatment) The history is provided by the patient.   42 year old female with intermittent substernal chest pain for 2 days. Last no more than a few seconds associated with some shortness of breath and some nausea. No leg swelling. No history of injury. No history of something similar. Pain does radiate to both shoulders. Pain at its worst is an 8/10 no pain currently. Patient does have a history of a heart murmur due to a small heart has been followed by [facility] cardiology in the past. Pain is not made worse or better by anything except perhaps sometimes he's been made worse by movement.  History reviewed. No pertinent past medical history. History reviewed. No pertinent past surgical history. History reviewed. No pertinent family history. History  Substance Use Topics  . Smoking status: Former Games developer  . Smokeless tobacco: Never Used     Comment: quit 2009  . Alcohol Use: No   OB History   Grav Para Term Preterm Abortions TAB SAB Ect Mult Living                 Review of Systems  Constitutional: Negative for fever.  HENT: Negative for congestion.   Eyes: Negative for redness.  Respiratory: Positive for shortness of breath. Negative for cough.   Cardiovascular: Positive for chest pain. Negative for palpitations and leg swelling.  Gastrointestinal: Positive for nausea. Negative for vomiting and diarrhea.  Genitourinary: Negative for dysuria.  Musculoskeletal: Negative for back pain.  Skin: Negative for rash.  Neurological: Negative for headaches.  Hematological: Does not bruise/bleed easily.  Psychiatric/Behavioral: Negative for confusion.    Allergies  Cefuroxime axetil and Cephalexin  Home Medications   Current  Outpatient Rx  Name  Route  Sig  Dispense  Refill  . fexofenadine (ALLEGRA) 180 MG tablet   Oral   Take 180 mg by mouth daily.         Marland Kitchen omeprazole (PRILOSEC) 20 MG capsule   Oral   Take 20 mg by mouth daily.         Marland Kitchen aspirin 81 MG chewable tablet   Oral   Chew 1 tablet (81 mg total) by mouth daily.   30 tablet   1   . famotidine (PEPCID) 20 MG tablet   Oral   Take 1 tablet (20 mg total) by mouth 2 (two) times daily.   30 tablet   0   . traMADol (ULTRAM) 50 MG tablet   Oral   Take 1 tablet (50 mg total) by mouth every 6 (six) hours as needed for pain.   15 tablet   0    BP 107/57  Pulse 67  Temp(Src) 98.2 F (36.8 C) (Oral)  Resp 17  SpO2 97% Physical Exam  Nursing note and vitals reviewed. Constitutional: She is oriented to person, place, and time. She appears well-developed and well-nourished. No distress.  HENT:  Head: Normocephalic and atraumatic.  Mouth/Throat: Oropharynx is clear and moist.  Eyes: Conjunctivae and EOM are normal. Pupils are equal, round, and reactive to light.  Neck: Normal range of motion. Neck supple.  Cardiovascular: Normal rate, regular rhythm, normal heart sounds and intact distal pulses.   Pulmonary/Chest: Effort normal and breath sounds normal. No respiratory distress. She has no wheezes. She  has no rales. She exhibits tenderness.  Some mild reproducible chest pain in the substernal area with palpation.  Abdominal: Soft. Bowel sounds are normal. There is no tenderness.  Musculoskeletal: Normal range of motion. She exhibits no edema.  Neurological: She is alert and oriented to person, place, and time. No cranial nerve deficit. She exhibits normal muscle tone. Coordination normal.  Skin: Skin is warm. No rash noted.    ED Course  Procedures (including critical care time) Labs Review Labs Reviewed  URINALYSIS, ROUTINE W REFLEX MICROSCOPIC - Abnormal; Notable for the following:    Specific Gravity, Urine 1.004 (*)    All other  components within normal limits  POCT I-STAT, CHEM 8 - Abnormal; Notable for the following:    BUN 4 (*)    Glucose, Bld 121 (*)    Hemoglobin 15.6 (*)    All other components within normal limits  CBC  D-DIMER, QUANTITATIVE  POCT I-STAT TROPONIN I   Results for orders placed during the hospital encounter of 01/01/13  CBC      Result Value Range   WBC 10.0  4.0 - 10.5 K/uL   RBC 4.68  3.87 - 5.11 MIL/uL   Hemoglobin 15.0  12.0 - 15.0 g/dL   HCT 81.1  91.4 - 78.2 %   MCV 90.6  78.0 - 100.0 fL   MCH 32.1  26.0 - 34.0 pg   MCHC 35.4  30.0 - 36.0 g/dL   RDW 95.6  21.3 - 08.6 %   Platelets 354  150 - 400 K/uL  URINALYSIS, ROUTINE W REFLEX MICROSCOPIC      Result Value Range   Color, Urine YELLOW  YELLOW   APPearance CLEAR  CLEAR   Specific Gravity, Urine 1.004 (*) 1.005 - 1.030   pH 7.0  5.0 - 8.0   Glucose, UA NEGATIVE  NEGATIVE mg/dL   Hgb urine dipstick NEGATIVE  NEGATIVE   Bilirubin Urine NEGATIVE  NEGATIVE   Ketones, ur NEGATIVE  NEGATIVE mg/dL   Protein, ur NEGATIVE  NEGATIVE mg/dL   Urobilinogen, UA 0.2  0.0 - 1.0 mg/dL   Nitrite NEGATIVE  NEGATIVE   Leukocytes, UA NEGATIVE  NEGATIVE  D-DIMER, QUANTITATIVE      Result Value Range   D-Dimer, Quant <0.27  0.00 - 0.48 ug/mL-FEU  POCT I-STAT, CHEM 8      Result Value Range   Sodium 142  135 - 145 mEq/L   Potassium 3.9  3.5 - 5.1 mEq/L   Chloride 105  96 - 112 mEq/L   BUN 4 (*) 6 - 23 mg/dL   Creatinine, Ser 5.78  0.50 - 1.10 mg/dL   Glucose, Bld 469 (*) 70 - 99 mg/dL   Calcium, Ion 6.29  5.28 - 1.23 mmol/L   TCO2 22  0 - 100 mmol/L   Hemoglobin 15.6 (*) 12.0 - 15.0 g/dL   HCT 41.3  24.4 - 01.0 %  POCT I-STAT TROPONIN I      Result Value Range   Troponin i, poc 0.00  0.00 - 0.08 ng/mL   Comment 3             Imaging Review Dg Chest 2 View  01/01/2013   CLINICAL DATA:  Chest pain  EXAM: CHEST  2 VIEW  COMPARISON:  None.  FINDINGS: The heart size and mediastinal contours are within normal limits. Both lungs are  clear. The visualized skeletal structures are unremarkable.  IMPRESSION: No active cardiopulmonary disease.   Electronically Signed  By: Alcide Clever M.D.   On: 01/01/2013 13:27    EKG Interpretation   None      Date: 01/01/2013  Rate: 75  Rhythm: normal sinus rhythm and sinus arrhythmia  QRS Axis: normal  Intervals: normal  ST/T Wave abnormalities: normal  Conduction Disutrbances:none  Narrative Interpretation:   Old EKG Reviewed: none available    MDM   1. Chest pain    Workup for chest pains negative. EKG without acute changes troponin is negative. Pains been intermittent over the past few days never lasting more than a few seconds. Not consistent with an acute cardiac event. Chest x-rays negative for pneumonia pneumothorax pulmonary edema. No significant electrolyte abnormalities. D-dimer is negative not consistent with pulmonary embolism. Urinalysis is not consistent with urinalysis and white blood cell count is not significantly elevated no significant anemia. Patient is followed by primary care Dr. also has been seen by [facility] cardiology in the past. Will refer to [facility] cardiology again. Patient has a known heart murmur do to a small opening in her heart evaluated by echocardiogram in the past. She has not seen them recently. This complaint is new. Will treat patient with tramadol some Pepcid and start a baby aspirin a day. Patient return for any new or worse symptoms. Additional workup for the chest pain can be done by cardiology and her primary care Dr. No significant cardiac risk factors other than the fact that her father may have had an MI at age 18 also she was a smoker but quit in 2009.    Shelda Jakes, MD 01/01/13 1434

## 2013-01-01 NOTE — ED Notes (Signed)
Pt c/o mid sternal CP with nausea x 2 days; pt sts some SOB as well

## 2013-01-09 ENCOUNTER — Encounter: Payer: Self-pay | Admitting: Family Medicine

## 2013-01-09 ENCOUNTER — Ambulatory Visit (INDEPENDENT_AMBULATORY_CARE_PROVIDER_SITE_OTHER): Payer: 59 | Admitting: Family Medicine

## 2013-01-09 VITALS — BP 104/70 | HR 80 | Temp 97.9°F | Ht 65.0 in | Wt 195.2 lb

## 2013-01-09 DIAGNOSIS — R1013 Epigastric pain: Secondary | ICD-10-CM

## 2013-01-09 NOTE — Progress Notes (Signed)
Subjective:    Patient ID: Emily Mclean, female    DOB: 04/07/1970, 42 y.o.   MRN: [id number]  HPI  42 year old female presents for ER follow up.   Seen in ER on 10/27 for intermittent substernal chest pain for 2 days. Lasted no more than a few seconds associated with some shortness of breath and some nausea. No leg swelling. No history of injury. No history of something similar. Pain did radiate to both shoulders. Pain at its worst is an 8/10  Patient does have a history of a heart murmur due to a small hole in heart has been followed by [facility] cardiology in the past ( told no further eval/tereat or expected issues from this). Pain is not made worse or better by anything except perhaps sometimes he's been made worse by movement.  IN ER: Workup for chest pains negative. EKG without acute changes troponin is negative. Chest x-rays negative for pneumonia pneumothorax pulmonary edema. No significant electrolyte abnormalities. D-dimer is negative not consistent with pulmonary embolism. Urinalysis is not consistent with urinalysis and white blood cell count is not significantly elevated no significant anemia.     Pain felt to be noncardiac. Treated patient with tramadol, Pepcid and start a baby aspirin a day.   No significant cardiac risk factors other than the fact that her father may have had an MI at age 26 also she was a smoker but quit in 2009.    TODAY: She reports that since then she  Has been feeling better but still having "bouts" of symptoms. Noted some after beans, tomatos or over-eating. No exertional componenet.  She has had epigastric pain in the past.. Improved on 14 day course but would always come back. She has never been been on longterm PPI.     Review of Systems  Constitutional: Negative for fever and fatigue.  HENT: Negative for ear pain.   Eyes: Negative for pain.  Respiratory: Negative for chest tightness and shortness of breath.   Cardiovascular: Negative for chest  pain, palpitations and leg swelling.  Gastrointestinal: Negative for abdominal pain.  Genitourinary: Negative for dysuria.       Objective:   Physical Exam  Constitutional: Vital signs are normal. She appears well-developed and well-nourished. She is cooperative.  Non-toxic appearance. She does not appear ill. No distress.  HENT:  Head: Normocephalic.  Right Ear: Hearing, tympanic membrane, external ear and ear canal normal. Tympanic membrane is not erythematous, not retracted and not bulging.  Left Ear: Hearing, tympanic membrane, external ear and ear canal normal. Tympanic membrane is not erythematous, not retracted and not bulging.  Nose: No mucosal edema or rhinorrhea. Right sinus exhibits no maxillary sinus tenderness and no frontal sinus tenderness. Left sinus exhibits no maxillary sinus tenderness and no frontal sinus tenderness.  Mouth/Throat: Uvula is midline, oropharynx is clear and moist and mucous membranes are normal.  Eyes: Conjunctivae, EOM and lids are normal. Pupils are equal, round, and reactive to light. Lids are everted and swept, no foreign bodies found.  Neck: Trachea normal and normal range of motion. Neck supple. Carotid bruit is not present. No mass and no thyromegaly present.  Cardiovascular: Normal rate, regular rhythm, S1 normal, S2 normal, normal heart sounds, intact distal pulses and normal pulses.  Exam reveals no gallop and no friction rub.   No murmur heard. Pulmonary/Chest: Effort normal and breath sounds normal. Not tachypneic. No respiratory distress. She has no decreased breath sounds. She has no wheezes. She has no rhonchi. She  has no rales.  Abdominal: Soft. Normal appearance and bowel sounds are normal. There is no tenderness.  Neurological: She is alert.  Skin: Skin is warm, dry and intact. No rash noted.  Psychiatric: Her speech is normal and behavior is normal. Judgment and thought content normal. Her mood appears not anxious. Cognition and memory are  normal. She does not exhibit a depressed mood.          Assessment & Plan:

## 2013-01-09 NOTE — Assessment & Plan Note (Signed)
No evidence of cardiac pain. No further cardiac work up needed at this time.  Likely GERD/gastritis.  Start diet and lifestyle modification, start daily prilosec 40 mg daily. Call if not improving as expected.

## 2013-01-09 NOTE — Patient Instructions (Addendum)
Avoid tomatos, spicy food, citris, caffeine ( change to decaf soda), chocolate, peppermint, alcohol, soda. Work on weight loss, smaller meal size, eating earlier. Start prilosec 40 mg daily x 4-6 weeks, then try to taper off... Go to 20 mg daily , then every other day then off. Call if your symptoms are not resolved in 2 weeks.

## 2014-04-19 ENCOUNTER — Other Ambulatory Visit (INDEPENDENT_AMBULATORY_CARE_PROVIDER_SITE_OTHER): Payer: 59

## 2014-04-19 ENCOUNTER — Encounter: Payer: Self-pay | Admitting: Nurse Practitioner

## 2014-04-19 ENCOUNTER — Ambulatory Visit (INDEPENDENT_AMBULATORY_CARE_PROVIDER_SITE_OTHER): Payer: 59 | Admitting: Nurse Practitioner

## 2014-04-19 VITALS — BP 110/70 | HR 87 | Temp 97.9°F | Ht 65.0 in | Wt 192.0 lb

## 2014-04-19 DIAGNOSIS — Z0000 Encounter for general adult medical examination without abnormal findings: Secondary | ICD-10-CM

## 2014-04-19 DIAGNOSIS — Z6831 Body mass index (BMI) 31.0-31.9, adult: Secondary | ICD-10-CM

## 2014-04-19 DIAGNOSIS — B351 Tinea unguium: Secondary | ICD-10-CM

## 2014-04-19 DIAGNOSIS — Z720 Tobacco use: Secondary | ICD-10-CM

## 2014-04-19 DIAGNOSIS — Z716 Tobacco abuse counseling: Secondary | ICD-10-CM

## 2014-04-19 LAB — CBC WITH DIFFERENTIAL/PLATELET
BASOS PCT: 1 % (ref 0–1)
Basophils Absolute: 0.1 10*3/uL (ref 0.0–0.1)
EOS ABS: 0.2 10*3/uL (ref 0.0–0.7)
Eosinophils Relative: 2 % (ref 0–5)
HEMATOCRIT: 40.6 % (ref 36.0–46.0)
HEMOGLOBIN: 13.9 g/dL (ref 12.0–15.0)
Lymphocytes Relative: 25 % (ref 12–46)
Lymphs Abs: 2.4 10*3/uL (ref 0.7–4.0)
MCH: 31.1 pg (ref 26.0–34.0)
MCHC: 34.2 g/dL (ref 30.0–36.0)
MCV: 90.8 fL (ref 78.0–100.0)
MONO ABS: 0.5 10*3/uL (ref 0.1–1.0)
MPV: 9.5 fL (ref 8.6–12.4)
Monocytes Relative: 5 % (ref 3–12)
NEUTROS ABS: 6.4 10*3/uL (ref 1.7–7.7)
Neutrophils Relative %: 67 % (ref 43–77)
Platelets: 420 10*3/uL — ABNORMAL HIGH (ref 150–400)
RBC: 4.47 MIL/uL (ref 3.87–5.11)
RDW: 13 % (ref 11.5–15.5)
WBC: 9.6 10*3/uL (ref 4.0–10.5)

## 2014-04-19 LAB — COMPREHENSIVE METABOLIC PANEL
ALK PHOS: 70 U/L (ref 39–117)
ALT: 17 U/L (ref 0–35)
AST: 16 U/L (ref 0–37)
Albumin: 4.1 g/dL (ref 3.5–5.2)
BUN: 7 mg/dL (ref 6–23)
CHLORIDE: 105 meq/L (ref 96–112)
CO2: 24 mEq/L (ref 19–32)
Calcium: 9.3 mg/dL (ref 8.4–10.5)
Creat: 0.57 mg/dL (ref 0.50–1.10)
Glucose, Bld: 89 mg/dL (ref 70–99)
POTASSIUM: 4.5 meq/L (ref 3.5–5.3)
Sodium: 137 mEq/L (ref 135–145)
TOTAL PROTEIN: 6.7 g/dL (ref 6.0–8.3)
Total Bilirubin: 0.5 mg/dL (ref 0.2–1.2)

## 2014-04-19 LAB — TSH: TSH: 1.757 u[IU]/mL (ref 0.350–4.500)

## 2014-04-19 LAB — LIPID PANEL
Cholesterol: 166 mg/dL (ref 0–200)
HDL: 42 mg/dL (ref >39)
LDL Cholesterol: 106 mg/dL — ABNORMAL HIGH (ref 0–99)
Total CHOL/HDL Ratio: 4 Ratio
Triglycerides: 89 mg/dL (ref <150)
VLDL: 18 mg/dL (ref 0–40)

## 2014-04-19 MED ORDER — VARENICLINE TARTRATE 0.5 MG X 11 & 1 MG X 42 PO MISC: ORAL | Status: DC

## 2014-04-19 NOTE — Progress Notes (Signed)
Pre visit review using our clinic review tool, if applicable. No additional management support is needed unless otherwise documented below in the visit note. 

## 2014-04-19 NOTE — Patient Instructions (Signed)
Please get labs done. My office will call with lab results.  Develop lifelong habits of exercise most days of the week: take a 30 minute walk. The benefits include weight loss, lower risk for heart disease, diabetes, stroke, high blood pressure, lower rates of depression & dementia, better sleep quality & bone health.   Cut out refined sugar: anything that is sweet when you eat or drink it except fresh fruit. Cut out white bread, rolls, biscuits, bagels, muffins, pasta and cereals. Breads & cereals that have 4 gm or more of fiber per serving are good. Whole wheat pasta, brown rice and quinoa are good.  Set a quit date for smoking cessation. Start chantix 1 week before your quit date. See me in 4 weeks to review continuation of chantix.  Smoking Cessation, Tips for Success If you are ready to quit smoking, congratulations! You have chosen to help yourself be healthier. Cigarettes bring nicotine, tar, carbon monoxide, and other irritants into your body. Your lungs, heart, and blood vessels will be able to work better without these poisons. There are many different ways to quit smoking. Nicotine gum, nicotine patches, a nicotine inhaler, or nicotine nasal spray can help with physical craving. Hypnosis, support groups, and medicines help break the habit of smoking. WHAT THINGS CAN I DO TO MAKE QUITTING EASIER?  Here are some tips to help you quit for good:  Pick a date when you will quit smoking completely. Tell all of your friends and family about your plan to quit on that date.  Do not try to slowly cut down on the number of cigarettes you are smoking. Pick a quit date and quit smoking completely starting on that day.  Throw away all cigarettes.   Clean and remove all ashtrays from your home, work, and car.  On a card, write down your reasons for quitting. Carry the card with you and read it when you get the urge to smoke.  Cleanse your body of nicotine. Drink enough water and fluids to keep  your urine clear or pale yellow. Do this after quitting to flush the nicotine from your body.  Learn to predict your moods. Do not let a bad situation be your excuse to have a cigarette. Some situations in your life might tempt you into wanting a cigarette.  Never have "just one" cigarette. It leads to wanting another and another. Remind yourself of your decision to quit.  Change habits associated with smoking. If you smoked while driving or when feeling stressed, try other activities to replace smoking. Stand up when drinking your coffee. Brush your teeth after eating. Sit in a different chair when you read the paper. Avoid alcohol while trying to quit, and try to drink fewer caffeinated beverages. Alcohol and caffeine may urge you to smoke.  Avoid foods and drinks that can trigger a desire to smoke, such as sugary or spicy foods and alcohol.  Ask people who smoke not to smoke around you.  Have something planned to do right after eating or having a cup of coffee. For example, plan to take a walk or exercise.  Try a relaxation exercise to calm you down and decrease your stress. Remember, you may be tense and nervous for the first 2 weeks after you quit, but this will pass.  Find new activities to keep your hands busy. Play with a pen, coin, or rubber band. Doodle or draw things on paper.  Brush your teeth right after eating. This will help cut down  on the craving for the taste of tobacco after meals. You can also try mouthwash.   Use oral substitutes in place of cigarettes. Try using lemon drops, carrots, cinnamon sticks, or chewing gum. Keep them handy so they are available when you have the urge to smoke.  When you have the urge to smoke, try deep breathing.  Designate your home as a nonsmoking area.  If you are a heavy smoker, ask your health care provider about a prescription for nicotine chewing gum. It can ease your withdrawal from nicotine.  Reward yourself. Set aside the  cigarette money you save and buy yourself something nice.  Look for support from others. Join a support group or smoking cessation program. Ask someone at home or at work to help you with your plan to quit smoking.  Always ask yourself, "Do I need this cigarette or is this just a reflex?" Tell yourself, "Today, I choose not to smoke," or "I do not want to smoke." You are reminding yourself of your decision to quit.  Do not replace cigarette smoking with electronic cigarettes (commonly called e-cigarettes). The safety of e-cigarettes is unknown, and some may contain harmful chemicals.  If you relapse, do not give up! Plan ahead and think about what you will do the next time you get the urge to smoke. HOW WILL I FEEL WHEN I QUIT SMOKING? You may have symptoms of withdrawal because your body is used to nicotine (the addictive substance in cigarettes). You may crave cigarettes, be irritable, feel very hungry, cough often, get headaches, or have difficulty concentrating. The withdrawal symptoms are only temporary. They are strongest when you first quit but will go away within 10-14 days. When withdrawal symptoms occur, stay in control. Think about your reasons for quitting. Remind yourself that these are signs that your body is healing and getting used to being without cigarettes. Remember that withdrawal symptoms are easier to treat than the major diseases that smoking can cause.  Even after the withdrawal is over, expect periodic urges to smoke. However, these cravings are generally short lived and will go away whether you smoke or not. Do not smoke! WHAT RESOURCES ARE AVAILABLE TO HELP ME QUIT SMOKING? Your health care provider can direct you to community resources or hospitals for support, which may include:  Group support.  Education.  Hypnosis.  Therapy. Document Released: 11/21/2003 Document Revised: 07/09/2013 Document Reviewed: 08/10/2012 [facility] Patient Information 2015 [place], [place].  This information is not intended to replace advice given to you by your health care provider. Make sure you discuss any questions you have with your health care provider.

## 2014-04-20 ENCOUNTER — Telehealth: Payer: Self-pay | Admitting: Nurse Practitioner

## 2014-04-20 LAB — HEMOGLOBIN A1C
HEMOGLOBIN A1C: 5.7 % — AB (ref <5.7)
Mean Plasma Glucose: 117 mg/dL — ABNORMAL HIGH (ref <117)

## 2014-04-20 LAB — VITAMIN D 25 HYDROXY (VIT D DEFICIENCY, FRACTURES): Vit D, 25-Hydroxy: 10 ng/mL — ABNORMAL LOW (ref 30–100)

## 2014-04-20 NOTE — Telephone Encounter (Signed)
pls call pt: Advise 2 concerns with labs: vitamin D is super low. She is at 10 & needs to be at 45. Start 5000 iu D3 daily with meal. Sched  OV f/u in 12 weeks.  Diabetes screen indicates she has prediabetes-this raises her risk of developing dm. She should make diet & exercise changes to decrease risk: 30 minutes exercise daily. Rarely eat refined sugar (anything sweet except fresh fruit) and all grains-rice pasta & all foods made with flour should have 4 grams fiber or more per serving.  I will see her in 12 weeks.

## 2014-04-22 ENCOUNTER — Encounter: Payer: Self-pay | Admitting: Nurse Practitioner

## 2014-04-22 DIAGNOSIS — Z716 Tobacco abuse counseling: Secondary | ICD-10-CM | POA: Insufficient documentation

## 2014-04-22 DIAGNOSIS — Z0000 Encounter for general adult medical examination without abnormal findings: Secondary | ICD-10-CM | POA: Insufficient documentation

## 2014-04-22 NOTE — Progress Notes (Signed)
Subjective:     Emily Mclean is a 44 y.o. female and is to establish care. She requests CPE & reports problems - nail fungus, occasional indigestion, desire to quit smoking. Prev care: she sees gyn for [facility]: last Pap 1 yr ago Nml. She has had some abnmls that were followed up w/nml tests. MMg 2015 was nml. Works a lot, does not exercise regularly. States portion sizes too big, thus weight gain. Nail fungus: R great toe, long standing-years. No intervention. Wants to see podiatrist. Nail is discolored & thick. Indigestion: Takes prilosec PRN with relief. Avoids food triggers-cooked tomatoes. Symptoms occur once every 3 mos. Smoking cessation: smokes 1/23 PPD for 15 yrs. Quit in past using chantix. Wants to try again.Husband also smokes.   History   Social History  . Marital Status: Married    Spouse Name: N/A  . Number of Children: 3  . Years of Education: N/A   Occupational History  .      business owner   Social History Main Topics  . Smoking status: Current Every Day Smoker -- 0.50 packs/day for 19 years  . Smokeless tobacco: Never Used     Comment: quit 2009  . Alcohol Use: No  . Drug Use: No  . Sexual Activity: Yes    Birth Control/ Protection: IUD   Other Topics Concern  . Not on file   Social History Narrative   Live w/husband & 3 kids. Business owner, work [employer].   Health Maintenance  Topic Date Due  . PAP SMEAR  03/09/2011  . INFLUENZA VACCINE  10/07/2014  . TETANUS/TDAP  03/08/2016    The following portions of the patient's history were reviewed and updated as appropriate: allergies, current medications, past family history, past medical history, past social history, past surgical history and problem list.  Review of Systems Constitutional: negative for fatigue and fevers Ears, nose, mouth, throat, and face: negative for sore throat Respiratory: negative for cough Cardiovascular: negative for lower extremity edema and palpitations Gastrointestinal:  negative for constipation, diarrhea and nausea Genitourinary:uses mirena, doesn't have bleeding every month Integument/breast: negative for rash Musculoskeletal:negative for arthralgias Behavioral/Psych: occasionally feels overwhelmed w/family & work pressures. Used lexapro in past but stopped due to no libido. wants to talk w/gyn next mo. Allergic/Immunologic: positive for seasonal allergies relieved by claritin   Objective:    BP 110/70 mmHg  Pulse 87  Temp(Src) 97.9 F (36.6 C) (Oral)  Ht 5\' 5"  (1.651 m)  Wt 192 lb (87.091 kg)  BMI 31.95 kg/m2  SpO2 97% General appearance: alert, cooperative, appears stated age and no distress Head: Normocephalic, without obvious abnormality, atraumatic Eyes: negative findings: lids and lashes normal, conjunctivae and sclerae normal, corneas clear and pupils equal, round, reactive to light and accomodation Ears: normal TM's and external ear canals both ears Throat: lips, mucosa, and tongue normal; teeth and gums normal Neck: no adenopathy, no carotid bruit, supple, symmetrical, trachea midline and thyroid not enlarged, symmetric, no tenderness/mass/nodules Lungs: clear to auscultation bilaterally Heart: regular rate and rhythm and systolic murmur Abdomen: soft, non-tender; bowel sounds normal; no masses,  no organomegaly Extremities: extremities normal, atraumatic, no cyanosis or edema  R toenails: Great toe nail is thick, yellow, cut very short. Other nails nml Neurologic: Grossly normal    Assessment:Plan   1. Preventative health care Discussed increased risks for obesity-related disease - Hemoglobin A1c - CBC with Differential/Platelet - Comprehensive metabolic panel - Lipid panel - TSH - Vit D  25 hydroxy (rtn osteoporosis  monitoring)  2. Encounter for smoking cessation counseling - varenicline (CHANTIX STARTING MONTH PAK) 0.5 MG X 11 & 1 MG X 42 tablet; Take one 0.5 mg tablet by mouth once daily for 3 days, then increase to one 0.5  mg tablet q12h for 4 days, then increase to one 1 mg tablet q12h.  Dispense: 53 tablet; Refill: 0  3. ONYCHOMYCOSIS, TOENAILS - Ambulatory referral to Podiatry Daily vinegar soaks & tea tree oil X 4-6 weeks 4. BMI 31.0-31.9,adult Diet changes & exercise: cut out sugar & refined grains. All grains should have 4 gms fiber or more /serving. Portion control HO discussed & given. 30 minutes exercise daily.  See pt instructions F/u 4 weeks-smoking cessation & weight management  Spent 25 Minutes with patient, 50% or more was spent in counseling.

## 2014-04-22 NOTE — Telephone Encounter (Signed)
error 

## 2014-04-22 NOTE — Assessment & Plan Note (Signed)
Discussed increase risk for obesity-related disease Diet changes & daily exercise.

## 2014-04-22 NOTE — Assessment & Plan Note (Signed)
Long standing Daily vinegar soak for 4 to 6 weeks or Few drops tea tree oil daily for 4 to 6 weeks Wants ref to podiatry

## 2014-04-23 NOTE — Telephone Encounter (Signed)
Patient aware of results and recommendations.  She will CB to Schedule appointment in 12 weeks.

## 2014-04-23 NOTE — Telephone Encounter (Signed)
LMOVM for pt to return call 

## 2014-05-13 ENCOUNTER — Encounter: Payer: Self-pay | Admitting: Podiatry

## 2014-05-13 ENCOUNTER — Ambulatory Visit (INDEPENDENT_AMBULATORY_CARE_PROVIDER_SITE_OTHER): Payer: 59 | Admitting: Podiatry

## 2014-05-13 VITALS — BP 130/80 | HR 74 | Resp 12

## 2014-05-13 DIAGNOSIS — B351 Tinea unguium: Secondary | ICD-10-CM | POA: Diagnosis not present

## 2014-05-13 DIAGNOSIS — M79673 Pain in unspecified foot: Secondary | ICD-10-CM

## 2014-05-13 NOTE — Patient Instructions (Signed)

## 2014-05-13 NOTE — Progress Notes (Signed)
Subjective:     Patient ID: Emily Mclean, female   DOB: [birth year], 44 y.o.   MRN: [id number]  HPI patient points to the left big toenail stating that it's been thick and yellow in the to third and an that it does not currently hurt but she does remember trauma a year ago   Review of Systems  All other systems reviewed and are negative.      Objective:   Physical Exam  Constitutional: She is oriented to person, place, and time.  Cardiovascular: Intact distal pulses.   Musculoskeletal: Normal range of motion.  Neurological: She is oriented to person, place, and time.  Skin: Skin is warm.  Nursing note and vitals reviewed.  neurovascular status intact with muscle strength adequate and range of motion subtalar midtarsal joint within normal limits. Patient's noted to have good digital perfusion is well oriented 3 and I noted on the dorsum of the left hallux the distal two thirds is yellow and crusty proximal one third looks relatively healthy     Assessment:     Mycotic nail infection with probable trauma    Plan:     H&P and condition discussed. I do think the best chance she has is laser and I reviewed laser therapy for this particular condition and explained the risk associated with thick. Patient wants procedure and understands risk and understands is not covered and is scheduled for procedure and I did dispensed formula 3 at this time with instructions

## 2014-05-13 NOTE — Progress Notes (Signed)
   Subjective:    Patient ID: Emily Mclean, female    DOB: Jul 17, 1970, 44 y.o.   MRN: [id number]  HPI PT STATED LT FOOT GREAT TOENAIL IS THICK, HAVE DISCOLORATION, SORE SOMETIMES FOR 1 YEAR. THE TOENAIL IS GETTING WORSE AND SOMETIMES GET AGGRAVATED BY PRESSURE. TRIED OTC ANTIFUNGAL BUT NO HELP.   Review of Systems  Skin: Positive for color change.  All other systems reviewed and are negative.      Objective:   Physical Exam        Assessment & Plan:

## 2014-05-22 ENCOUNTER — Ambulatory Visit (INDEPENDENT_AMBULATORY_CARE_PROVIDER_SITE_OTHER): Payer: 59 | Admitting: Podiatry

## 2014-05-22 DIAGNOSIS — B351 Tinea unguium: Secondary | ICD-10-CM

## 2014-05-22 DIAGNOSIS — M79673 Pain in unspecified foot: Secondary | ICD-10-CM

## 2014-05-23 ENCOUNTER — Other Ambulatory Visit: Payer: Self-pay | Admitting: Nurse Practitioner

## 2014-05-23 ENCOUNTER — Telehealth: Payer: Self-pay

## 2014-05-23 DIAGNOSIS — Z716 Tobacco abuse counseling: Secondary | ICD-10-CM

## 2014-05-23 MED ORDER — VARENICLINE TARTRATE 1 MG PO TABS: ORAL_TABLET | ORAL | Status: DC

## 2014-05-23 NOTE — Telephone Encounter (Signed)
Patient called back and I informed her of everything that was going on with the prior auth. Once I get the ok back I will call to schedule an appt. Patient agrees with plan of care

## 2014-05-23 NOTE — Telephone Encounter (Signed)
Please Advise? Patient called stating that she has not been able to pick up her Chantix yet for her smoking cessation that was scheduled tomorrow. She said that the [facility] in [place] said she need a prior auth, although we never received one (I asked Estelle June about this.) It looks like the medicine was discontinued on 05/13/14? Patient asked that you send in other rx for the Chantix [facility] and to cancel appointment for tomorrow.

## 2014-05-23 NOTE — Progress Notes (Signed)
Subjective:     Patient ID: Emily Mclean, female   DOB: April 05, 1970, 43 y.o.   MRN: [id number]  HPI patient presents for laser of big toenail   Review of Systems     Objective:   Physical Exam Neurovascular status intact with thick nail bed with discoloration    Assessment:     Mycotic nail infection nailbed    Plan:     Laser 1000 pulses tolerated well and reappoint 6 weeks

## 2014-05-23 NOTE — Telephone Encounter (Signed)
LMOVM asking patient to CB 

## 2014-05-23 NOTE — Telephone Encounter (Signed)
Received a prior auth that needed to be filled [facility]. I have filled out appropriate form and sent to [place], [phone]. I will await to hear from them.

## 2014-05-23 NOTE — Telephone Encounter (Signed)
No problem! New script sent. Pls reschedule for 4 weeks.

## 2014-05-24 ENCOUNTER — Other Ambulatory Visit: Payer: Self-pay | Admitting: Nurse Practitioner

## 2014-05-24 ENCOUNTER — Ambulatory Visit: Payer: 59 | Admitting: Nurse Practitioner

## 2014-05-24 DIAGNOSIS — Z716 Tobacco abuse counseling: Secondary | ICD-10-CM

## 2014-05-24 MED ORDER — BUPROPION HCL ER (SR) 150 MG PO TB12: ORAL_TABLET | ORAL | Status: DC

## 2014-05-24 NOTE — Telephone Encounter (Signed)
[place], [place] P - 05/24/2014 4:15 PM >','<< Less Detail',event)" href="javascript:;">More Detail >>   Irene Pap, NP   Sent: Fri May 24, 2014 4:15 PM    To: Audley Hose, CMA        Message     ----- Message from Irene Pap, NP sent at 05/24/2014 4:15 PM EDT -----    wellbutrin sent. Pls call pt advise to set quit date & start wellbutrin 1 week prior to date.    LMOVM to give patient the above message

## 2014-05-24 NOTE — Telephone Encounter (Signed)
Called and spoke with patient. Her Prior auth for chantix was denied. She has not taking burpropion before.

## 2014-05-24 NOTE — Telephone Encounter (Signed)
Call & let her know that her ins will not pay for chantix unless she has failed wellbutrin or cannot take it. Has she ever taken wellbutrin? If not is she willing to try?

## 2014-05-24 NOTE — Telephone Encounter (Signed)
I have already spoken with patient about the wellbutrin. She has not taken it before and is willing to try it.

## 2014-05-29 NOTE — Telephone Encounter (Signed)
LMOVM informing patient to set a quit date and to start wellbutrin 1 week prior to that date. Patient to [facility] with any concerns or questions

## 2014-07-08 ENCOUNTER — Ambulatory Visit (HOSPITAL_BASED_OUTPATIENT_CLINIC_OR_DEPARTMENT_OTHER)
Admission: RE | Admit: 2014-07-08 | Discharge: 2014-07-08 | Disposition: A | Discharge status: Home / Self Care | Payer: 59 | Source: Ambulatory Visit | Attending: Nurse Practitioner | Admitting: Nurse Practitioner

## 2014-07-08 ENCOUNTER — Ambulatory Visit (INDEPENDENT_AMBULATORY_CARE_PROVIDER_SITE_OTHER): Payer: 59 | Admitting: Nurse Practitioner

## 2014-07-08 ENCOUNTER — Telehealth: Payer: Self-pay | Admitting: Nurse Practitioner

## 2014-07-08 VITALS — BP 111/78 | HR 75 | Temp 98.0°F | Ht 65.0 in | Wt 198.0 lb

## 2014-07-08 DIAGNOSIS — M546 Pain in thoracic spine: Secondary | ICD-10-CM | POA: Diagnosis not present

## 2014-07-08 DIAGNOSIS — M549 Dorsalgia, unspecified: Secondary | ICD-10-CM | POA: Diagnosis not present

## 2014-07-08 MED ORDER — METHYLPREDNISOLONE ACETATE 40 MG/ML IJ SUSP
40.0000 mg | Freq: Once | INTRAMUSCULAR | Status: AC
  Administered 2014-07-08: 40 mg via INTRAMUSCULAR

## 2014-07-08 MED ORDER — METHOCARBAMOL 750 MG PO TABS: 750.0000 mg | ORAL_TABLET | Freq: Two times a day (BID) | ORAL | Status: DC | PRN

## 2014-07-08 MED ORDER — PREDNISONE 10 MG PO TABS: ORAL_TABLET | ORAL | Status: DC

## 2014-07-08 NOTE — Progress Notes (Signed)
   Subjective:    Patient ID: Emily Mclean, female    DOB: October 29, 1970, 44 y.o.   MRN: [id number]  HPI Comments: Just returned from [place] started after several days of amusement rides Pt is not taking wellbutrin for smoking cessation-she will cb if wants to start.  Back Pain This is a new problem. The current episode started in the past 7 days (3d). The problem occurs intermittently. The problem is unchanged. Pain location: mid back r side. The quality of the pain is described as shooting. Radiates to: radiates to anterior abdomen & to L side back. The pain is moderate. The symptoms are aggravated by bending (urinating, drinking). Pertinent negatives include no abdominal pain, bladder incontinence, dysuria, fever, headaches, leg pain, numbness, pelvic pain, tingling or weakness. She has tried heat and NSAIDs for the symptoms. The treatment provided moderate (pain returns when meds wear off) relief.      Review of Systems  Constitutional: Negative for fever.  Gastrointestinal: Negative for abdominal pain.  Genitourinary: Negative for bladder incontinence, dysuria and pelvic pain.  Musculoskeletal: Positive for back pain.  Neurological: Negative for tingling, weakness, numbness and headaches.       Objective:   Physical Exam  Constitutional: She is oriented to person, place, and time. She appears well-developed and well-nourished. No distress.  HENT:  Head: Normocephalic and atraumatic.  Eyes: Conjunctivae are normal. Right eye exhibits no discharge. Left eye exhibits no discharge.  Neck: Normal range of motion.  Cardiovascular: Normal rate.   Pulmonary/Chest: Effort normal.  Abdominal: Soft. She exhibits no distension and no mass. There is no tenderness. There is no rebound and no guarding.  Musculoskeletal: She exhibits tenderness (no CVA tenderness, R paraspinal tenderness adjacent to about T10. mild pain w/lforward & lateral flexion of spine R & L.). She exhibits no  edema.  Neurological: She is alert and oriented to person, place, and time.  Skin: Skin is warm and dry.  Psychiatric: She has a normal mood and affect. Her behavior is normal. Thought content normal.  Vitals reviewed.         Assessment & Plan:  1. Right-sided thoracic back pain Pain started after several days amusement rides Likely MSK strain Possible rib or vertebral fx, smoker - DG Thoracic Spine 2 View; Future - predniSONE (DELTASONE) 10 MG tablet; Starting tomorrow, Take 4Tpo qam X 2d, then 3T po qam X 3d, then 2T po qd X 3d, then 1T po qam X 3d.  Dispense: 26 tablet; Refill: 0 - methylPREDNISolone acetate (DEPO-MEDROL) injection 40 mg; Inject 1 mL (40 mg total) into the muscle once. - methocarbamol (ROBAXIN) 750 MG tablet; Take 1 tablet (750 mg total) by mouth every 12 (twelve) hours as needed for muscle spasms.  Dispense: 30 tablet; Refill: 0  F/u 10 days

## 2014-07-08 NOTE — Telephone Encounter (Signed)
Called and informed patient of x-ray results.

## 2014-07-08 NOTE — Progress Notes (Signed)
Pre visit review using our clinic review tool, if applicable. No additional management support is needed unless otherwise documented below in the visit note. 

## 2014-07-08 NOTE — Patient Instructions (Signed)
Start prednisone tomorrow. Take muscle relaxer at night or when you will not be driving or need to be alert. Continue to use heat on back. Stop using ibuprophen. OK to use tylenol 1000 mg up to 3 times daily.  Please get xray. I will call with results.  Please return in 10 days. Call if pain gets worse, you develop new symptoms, or you have no improvement with prednisone within 3-4 days.

## 2014-07-08 NOTE — Telephone Encounter (Signed)
pls call pt: Advise xrays show mild arthritic changes in spine, but nothing that explicitly explains symptoms.  Pain is likely muscle strain & will respond to prednisone & muscle relaxer. She should let us know if no relief by Wednesday or symptoms worse.

## 2014-07-10 ENCOUNTER — Ambulatory Visit (INDEPENDENT_AMBULATORY_CARE_PROVIDER_SITE_OTHER): Payer: 59 | Admitting: Podiatry

## 2014-07-10 ENCOUNTER — Encounter: Payer: Self-pay | Admitting: Podiatry

## 2014-07-10 DIAGNOSIS — B351 Tinea unguium: Secondary | ICD-10-CM

## 2014-07-10 MED ORDER — TERBINAFINE HCL 250 MG PO TABS: ORAL_TABLET | ORAL | Status: DC

## 2014-07-10 NOTE — Progress Notes (Signed)
Subjective:     Patient ID: Emily Mclean, female   DOB: [birth year], 44 y.o.   MRN: [id number]  HPI patient states my big toenail left is still quite thick   Review of Systems     Objective:   Physical Exam Neurovascular status intact with thick yellow nailbeds left with a small amount of clearance on the proximal portion    Assessment:     Mycotic nail left which is improving    Plan:     Laser administered and also we will begin a close 1 week Lamisil treatment for 1 month followed by 3 treatments for 1 month. They will be done on a weekly basis per month

## 2014-10-09 ENCOUNTER — Ambulatory Visit: Payer: 59 | Admitting: Podiatry

## 2015-11-05 ENCOUNTER — Ambulatory Visit (INDEPENDENT_AMBULATORY_CARE_PROVIDER_SITE_OTHER): Payer: 59 | Admitting: Podiatry

## 2015-11-05 ENCOUNTER — Encounter: Payer: Self-pay | Admitting: Podiatry

## 2015-11-05 VITALS — BP 112/68 | HR 78 | Resp 16

## 2015-11-05 DIAGNOSIS — L600 Ingrowing nail: Secondary | ICD-10-CM

## 2015-11-05 DIAGNOSIS — B351 Tinea unguium: Secondary | ICD-10-CM | POA: Diagnosis not present

## 2015-11-05 MED ORDER — TERBINAFINE HCL 250 MG PO TABS: ORAL_TABLET | ORAL | 0 refills | Status: DC

## 2015-11-05 NOTE — Progress Notes (Signed)
Subjective:     Patient ID: Emily Mclean, female   DOB: 29-Apr-1970, 45 y.o.   MRN: [id number]  HPI patient presents with nail disease of the big toes bilateral and likely on the adjacent digits. Patient states that she does not like it and he gets thick and she is concerned about future increase in fungal condition    Review of Systems  All other systems reviewed and are negative.      Objective:   Physical Exam  Constitutional: She is oriented to person, place, and time.  Cardiovascular: Intact distal pulses.   Musculoskeletal: Normal range of motion.  Neurological: She is oriented to person, place, and time.  Skin: Skin is warm.  Nursing note and vitals reviewed.  neurovascular status intact muscle strength adequate range of motion within normal limits with patient found to have nail disease of the big toes bilateral and light nail disease on the adjacent digits. Patient has good digital perfusion is well oriented 3 and has trauma to the right second nail with incurvation and thickness     Assessment:     P all conditions reviewed and condition discussed with patient at great length    Plan:     I reviewed mycotic nail infection we discussed treatment options and at this time we have decided on a pulse oral antifungal consisting of 7 pills per month for 4 months topical medicine and laser. I've recommended approximate 3-4 lasers and I did discuss that she will have the big toenails done and lightly on the adjacent digits

## 2015-11-05 NOTE — Patient Instructions (Signed)

## 2015-12-08 ENCOUNTER — Encounter: Payer: Self-pay | Admitting: Allergy

## 2015-12-08 ENCOUNTER — Ambulatory Visit (INDEPENDENT_AMBULATORY_CARE_PROVIDER_SITE_OTHER): Payer: 59 | Admitting: Allergy

## 2015-12-08 ENCOUNTER — Encounter (INDEPENDENT_AMBULATORY_CARE_PROVIDER_SITE_OTHER): Payer: Self-pay

## 2015-12-08 VITALS — BP 116/70 | HR 92 | Temp 97.6°F | Resp 16 | Ht 63.98 in | Wt 164.2 lb

## 2015-12-08 DIAGNOSIS — Z872 Personal history of diseases of the skin and subcutaneous tissue: Secondary | ICD-10-CM

## 2015-12-08 LAB — CBC WITH DIFFERENTIAL/PLATELET
BASOS PCT: 0 %
Basophils Absolute: 0 cells/uL (ref 0–200)
EOS ABS: 282 {cells}/uL (ref 15–500)
Eosinophils Relative: 2 %
HCT: 43.2 % (ref 35.0–45.0)
HEMOGLOBIN: 14.5 g/dL (ref 11.7–15.5)
LYMPHS ABS: 4794 {cells}/uL — AB (ref 850–3900)
Lymphocytes Relative: 34 %
MCH: 31.7 pg (ref 27.0–33.0)
MCHC: 33.6 g/dL (ref 32.0–36.0)
MCV: 94.5 fL (ref 80.0–100.0)
MONOS PCT: 7 %
MPV: 9.5 fL (ref 7.5–12.5)
Monocytes Absolute: 987 cells/uL — ABNORMAL HIGH (ref 200–950)
NEUTROS ABS: 8037 {cells}/uL — AB (ref 1500–7800)
Neutrophils Relative %: 57 %
PLATELETS: 482 10*3/uL — AB (ref 140–400)
RBC: 4.57 MIL/uL (ref 3.80–5.10)
RDW: 13.2 % (ref 11.0–15.0)
WBC: 14.1 10*3/uL — ABNORMAL HIGH (ref 3.8–10.8)

## 2015-12-08 LAB — COMPREHENSIVE METABOLIC PANEL
ALBUMIN: 4.1 g/dL (ref 3.6–5.1)
ALK PHOS: 56 U/L (ref 33–115)
ALT: 15 U/L (ref 6–29)
AST: 16 U/L (ref 10–30)
BUN: 10 mg/dL (ref 7–25)
CHLORIDE: 100 mmol/L (ref 98–110)
CO2: 29 mmol/L (ref 20–31)
Calcium: 9.4 mg/dL (ref 8.6–10.2)
Creat: 0.67 mg/dL (ref 0.50–1.10)
Glucose, Bld: 91 mg/dL (ref 65–99)
Potassium: 4.1 mmol/L (ref 3.5–5.3)
Sodium: 140 mmol/L (ref 135–146)
TOTAL PROTEIN: 6.6 g/dL (ref 6.1–8.1)
Total Bilirubin: 0.3 mg/dL (ref 0.2–1.2)

## 2015-12-08 MED ORDER — RANITIDINE HCL 150 MG PO TABS: 150.0000 mg | ORAL_TABLET | Freq: Two times a day (BID) | ORAL | 3 refills | Status: DC

## 2015-12-08 NOTE — Patient Instructions (Addendum)
Cold-induced urticaria   - Start Allegra 180 mg twice a day   - If you continue to have hives on Allegra add Zantac 150 milligrams twice a day   - May use her Atarax at bedtime as needed   - Avoid cold exposure as much as possible: Avoid cold drinks, cold foods, use gloves and scarves outside if chilly or cold, avoid swimming pools or lakes and cold showers   - You have access to an EpiPen previously prescribed   - We'll obtain additional labwork to complement labs ordered by dermatology: : CBC with differential, hive panel, tryptase   Follow-up 2-3 months

## 2015-12-08 NOTE — Progress Notes (Signed)
New Patient Note  RE: Emily Mclean MRN: [id number] DOB: 07-24-1970 Date of Office Visit: 12/08/2015  Referring provider: Arlyss Gandy* Primary care provider: Irene Pap, NP  Chief Complaint: Cold-induced urticaria   History of present illness: Emily Mclean is a 45 y.o. female presenting today for consultation for cold urticaria.  She started developing hives about a week or so ago.  She was able to identify that cold was triggering her hives.  She recalls eating frozen yogurt and felt like her "top of my stomach is contracted" and her  throat feels weird. She noticed she would develop hives after she carried cold drinks on her bare arms. While at a soccer game sitting on metal seat she noted hives in the areas where her skin was in contact with the metal seat.   When she went out to take her kids to the bus stop this morning in the chilly weather she noticed that her hands were starting to feel itchy and developed hives.  She initially went to urgent care where she got steroids and advised take Benadryl.  As the hives continued she went and saw her dermatologist who recommended that she take Allegra daily and hydroxyzine at bedtime.  She did have an ice cube test at her dermatologist that was positive.   She was prescribed an EpiPen.  She reports she has only had hives in relationship to 2 cephalosporins.  She denies any preceding illnesses. She denies any new foods, medications, soap lotions detergents, stings. She denies any swelling. She has not had any difficulty breathing, nausea or vomiting or lightheadedness or syncopal episodes.  The hives are resolved within 24 hours. They do not leave any marks or bruising. She denies any joint pain or arthralgias. She has not been swimming or in lakes.    She had allergies "bad" as a child.  She was on allergy shots for a while as a child.  She currently reports seasonal allergy for which she takes allegra with good relief.   She has no history of asthma or eczema.   Review of systems: Review of Systems  Constitutional: Positive for weight loss (Intentional). Negative for chills, fever and malaise/fatigue.  HENT: Positive for sore throat. Negative for congestion.   Eyes: Negative for redness.  Respiratory: Negative for cough, shortness of breath and wheezing.   Cardiovascular: Negative for chest pain.  Gastrointestinal: Negative for nausea and vomiting.  Musculoskeletal: Negative for joint pain.  Skin: Positive for itching and rash.  Neurological: Negative for loss of consciousness and headaches.    All other systems negative unless noted above in HPI  Past medical history: Past Medical History:  Diagnosis Date  . Urticaria     Past surgical history: Past Surgical History:  Procedure Laterality Date  . NASAL SINUS SURGERY     polyps removed    Family history:  Family History  Problem Relation Age of Onset  . Diabetes Mother   . Kidney disease Mother   . Depression Mother   . Allergic rhinitis Mother   . Diabetes Father   . Hypertension Father   . Heart disease Father   . Diabetes Brother   . Hypertension Brother   . Kidney disease Maternal Aunt   . Depression Maternal Grandfather     suicide  . Cancer Maternal Grandfather     lymphoma  No family history of cold-induced urticaria or other types of urticaria.  No family history of angioedema  Social history:  Social History Narrative   Live w/husband & 3 kids. Business owner, work [employer]. lives in a home with carpeting in the bedroom with gas heating and central cooling. There are no pets in the home. She does smoke half a pack cigarettes a day since 1995     Medication List:   Medication List       Accurate as of 12/08/15 10:44 AM. Always use your most recent med list.          aspirin 81 MG chewable tablet Chew 1 tablet (81 mg total) by mouth daily.   fexofenadine 180 MG tablet Commonly known as:  ALLEGRA Take 180 mg by  mouth daily.   hydrOXYzine 25 MG tablet Commonly known as:  ATARAX/VISTARIL Take 25 mg by mouth at bedtime.   omeprazole 20 MG capsule Commonly known as:  PRILOSEC Take 40 mg by mouth as needed.   predniSONE 10 MG tablet Commonly known as:  DELTASONE Take 10 mg by mouth daily with breakfast.   terbinafine 250 MG tablet Commonly known as:  LAMISIL Please take one a day x 7days, repeat every 4 weeks x 4 months   Vitamin D 2000 units Caps Take by mouth.       Known medication allergies: Allergies  Allergen Reactions  . Cefuroxime Axetil     REACTION: red rash  . Cephalexin     REACTION: red rash     Physical examination: Blood pressure 116/70, pulse 92, temperature 97.6 F (36.4 C), temperature source Oral, resp. rate 16, height 5' 3.98" (1.625 m), weight 164 lb 3.2 oz (74.5 kg), SpO2 98 %.  General: Alert, interactive, in no acute distress. HEENT: TMs pearly gray, turbinates minimally edematous without discharge, post-pharynx non erythematous. Neck: Supple without lymphadenopathy. Lungs: Clear to auscultation without wheezing, rhonchi or rales. {no increased work of breathing. CV: Normal S1, S2 without murmurs. Abdomen: Nondistended, nontender. Skin: Warm and dry, without lesions or rashes. Extremities:  No clubbing, cyanosis or edema. Neuro:   Grossly intact.  Diagnositics/Labs:  none today   Assessment and plan:   Cold-induced urticaria   - Start Allegra 180 mg twice a day   - If you continue to have hives on Allegra add Zantac '150mg'$  twice a day   - May use Atarax '25mg'$  at bedtime as needed   - Avoid cold exposure as much as possible: Avoid cold drinks, cold foods, use gloves and scarves outside if chilly or cold, avoid swimming pools or lakes and cold showers   - She was previous he prescribed an EpiPen to use as needed for reaction   - We'll obtain additional labwork to complement labs ordered by dermatology: CBC with differential, CMP, ESR, hive panel,  tryptase   - If she continues to struggle with cold-induced urticaria despite high-dose antihistamine use consider initiation of Xolair   Follow-up 2-3 months  I appreciate the opportunity to take part in Rockell's care. Please do not hesitate to contact me with questions.  Sincerely,   Prudy Feeler, MD Allergy/Immunology [facility] [place] [facility] [place]

## 2015-12-09 LAB — TRYPTASE: Tryptase: 4.8 ug/L (ref <11)

## 2015-12-09 LAB — SEDIMENTATION RATE: Sed Rate: 1 mm/hr (ref 0–20)

## 2015-12-10 ENCOUNTER — Encounter: Payer: Self-pay | Admitting: Allergy

## 2015-12-10 ENCOUNTER — Encounter: Payer: Self-pay | Admitting: Family Medicine

## 2015-12-10 ENCOUNTER — Ambulatory Visit (INDEPENDENT_AMBULATORY_CARE_PROVIDER_SITE_OTHER): Payer: 59 | Admitting: Family Medicine

## 2015-12-10 VITALS — BP 116/77 | HR 74 | Temp 98.0°F | Resp 20 | Wt 164.2 lb

## 2015-12-10 DIAGNOSIS — G8929 Other chronic pain: Secondary | ICD-10-CM

## 2015-12-10 DIAGNOSIS — R1013 Epigastric pain: Secondary | ICD-10-CM

## 2015-12-10 LAB — COMPREHENSIVE METABOLIC PANEL
ALBUMIN: 3.8 g/dL (ref 3.5–5.2)
ALT: 17 U/L (ref 0–35)
AST: 15 U/L (ref 0–37)
Alkaline Phosphatase: 59 U/L (ref 39–117)
BILIRUBIN TOTAL: 0.3 mg/dL (ref 0.2–1.2)
BUN: 6 mg/dL (ref 6–23)
CALCIUM: 8.9 mg/dL (ref 8.4–10.5)
CO2: 31 mEq/L (ref 19–32)
Chloride: 105 mEq/L (ref 96–112)
Creatinine, Ser: 0.67 mg/dL (ref 0.40–1.20)
GFR: 102.12 mL/min (ref >60.00)
Glucose, Bld: 112 mg/dL — ABNORMAL HIGH (ref 70–99)
Potassium: 4.6 mEq/L (ref 3.5–5.1)
Sodium: 140 mEq/L (ref 135–145)
Total Protein: 6.3 g/dL (ref 6.0–8.3)

## 2015-12-10 LAB — H. PYLORI ANTIBODY, IGG: H Pylori IgG: NEGATIVE

## 2015-12-10 LAB — LIPASE: LIPASE: 33 U/L (ref 11.0–59.0)

## 2015-12-10 MED ORDER — OMEPRAZOLE 40 MG PO CPDR: 40.0000 mg | DELAYED_RELEASE_CAPSULE | ORAL | 1 refills | Status: DC | PRN

## 2015-12-10 NOTE — Progress Notes (Signed)
[place] , 1970/06/02, 45 y.o., female MRN: [id number] Patient Care Team    Relationship Specialty Notifications Start End  Ma Hillock, DO PCP - General Family Medicine  12/10/15     CC: GERD symtpoms Subjective:   Epigastric pain: pt reports about 6 days ago she started having epigastric pain that radiated to her back. She feels the pain is worse with "cold" fluids and the evening time. She does not feel it was associated with food. She reports she has been feeling horrible, low energy, nausea and increased belching. For the past couple of week she has also been breaking out in hives. She has seen an allergist, and currently they do not know cause. She is taking omeprazole OTC 40 mg daily for past 5 days, she was started on allegra, zantac and vistaril for hives. She states she has had a similar feeling when "stretching to much" and it feels like something gets stuck. She also states her bra can put to much pressure over the area and cause discomfort. She denies heartburn, sour taste. She has had mild hoarseness. She has not seen ENT or GI in the past for issues. Her lipid panel has been normal, she does not drink alcohol. She denies fever, chills, bowel changes or diarrhea. She does not use NSAIDS.   Allergies  Allergen Reactions  . Cefuroxime Axetil     REACTION: red rash  . Cephalexin     REACTION: red rash   Social History  Substance Use Topics  . Smoking status: Current Every Day Smoker    Packs/day: 0.50    Years: 19.00  . Smokeless tobacco: Never Used  . Alcohol use No   Past Medical History:  Diagnosis Date  . Allergy   . Heart murmur   . Urticaria    Past Surgical History:  Procedure Laterality Date  . NASAL SINUS SURGERY     polyps removed   Family History  Problem Relation Age of Onset  . Diabetes Mother   . Kidney disease Mother   . Depression Mother   . Allergic rhinitis Mother   . Diabetes Father   . Hypertension Father   . Heart disease  Father   . Diabetes Brother   . Hypertension Brother   . Kidney disease Maternal Aunt   . Depression Maternal Grandfather     suicide  . Cancer Maternal Grandfather     lymphoma     Medication List       Accurate as of 12/10/15 10:29 AM. Always use your most recent med list.          fexofenadine 180 MG tablet Commonly known as:  ALLEGRA Take 180 mg by mouth daily.   hydrOXYzine 25 MG tablet Commonly known as:  ATARAX/VISTARIL Take 25 mg by mouth at bedtime.   omeprazole 20 MG capsule Commonly known as:  PRILOSEC Take 40 mg by mouth as needed.   ranitidine 150 MG tablet Commonly known as:  ZANTAC Take 1 tablet (150 mg total) by mouth 2 (two) times daily.   Vitamin D 2000 units Caps Take by mouth.       No results found for this or any previous visit (from the past 24 hour(s)). No results found.   ROS: Negative, with the exception of above mentioned in HPI   Objective:  BP 116/77 (BP Location: Right Arm, Patient Position: Sitting, Cuff Size: Normal)   Pulse 74   Temp 98 F (36.7 C)  Resp 20   Wt 164 lb 4 oz (74.5 kg)   SpO2 96%   BMI 28.21 kg/m  Body mass index is 28.21 kg/m. Gen: Afebrile. No acute distress. Nontoxic in appearance, well developed, well nourished. Pleasant caucasian female.  HENT: AT. [place].  MMM, no oral lesions. Throat without erythema or exudates. No cough, mild hoarseness.  Eyes:Pupils Equal Round Reactive to light, Extraocular movements intact,  Conjunctiva without redness, discharge or icterus. Neck/lymp/endocrine: Supple,no lymphadenopathy CV: RRR  Chest: CTAB, no wheeze or crackles.  Abd: Soft. flat. ND. Mild pain left upper quad.  BS present. no Masses palpated. No rebound or guarding.  Skin: no rashes, purpura or petechiae. No skin changes.  Neuro: Normal gait. PERLA. EOMi. Alert. Oriented x3  Assessment/Plan: [place] P Faerber is a 44 y.o. female present for acute OV for  Abdominal pain, chronic, epigastric - discussed  ddx: pancreatitis, gall bladder/stone, GERD/PUD - Start GERD diet. No NSAIDS. Labs collected today.  - If not improving and labs indicate could consider ABD [place].  - Comp Met (CMET) - Lipase - H. pylori antibody, IgG - omeprazole (PRILOSEC) 40 MG capsule; Take 1 capsule (40 mg total) by mouth as needed.  Dispense: 30 capsule; Refill: 1 - f/u 4 weeks.    electronically signed by:  Howard Pouch, DO  [place]

## 2015-12-10 NOTE — Patient Instructions (Signed)
It was a pleasure meeting you.  We will collect labs and call you with results.  I have called in the 40 mg omeprazole to take daily, take the zantac twice a day as well.  Watch your diet.    Food Choices for Gastroesophageal Reflux Disease, Adult When you have gastroesophageal reflux disease (GERD), the foods you eat and your eating habits are very important. Choosing the right foods can help ease the discomfort of GERD. WHAT GENERAL GUIDELINES DO I NEED TO FOLLOW?  Choose fruits, vegetables, whole grains, low-fat dairy products, and low-fat meat, fish, and poultry.  Limit fats such as oils, salad dressings, butter, nuts, and avocado.  Keep a food diary to identify foods that cause symptoms.  Avoid foods that cause reflux. These may be different for different people.  Eat frequent small meals instead of three large meals each day.  Eat your meals slowly, in a relaxed setting.  Limit fried foods.  Cook foods using methods other than frying.  Avoid drinking alcohol.  Avoid drinking large amounts of liquids with your meals.  Avoid bending over or lying down until 2-3 hours after eating. WHAT FOODS ARE NOT RECOMMENDED? The following are some foods and drinks that may worsen your symptoms: Vegetables Tomatoes. Tomato juice. Tomato and spaghetti sauce. Chili peppers. Onion and garlic. Horseradish. Fruits Oranges, grapefruit, and lemon (fruit and juice). Meats High-fat meats, fish, and poultry. This includes hot dogs, ribs, ham, sausage, salami, and bacon. Dairy Whole milk and chocolate milk. Sour cream. Cream. Butter. Ice cream. Cream cheese.  Beverages Coffee and tea, with or without caffeine. Carbonated beverages or energy drinks. Condiments Hot sauce. Barbecue sauce.  Sweets/Desserts Chocolate and cocoa. Donuts. Peppermint and spearmint. Fats and Oils High-fat foods, including [place] fries and potato chips. Other Vinegar. Strong spices, such as black pepper, white  pepper, red pepper, cayenne, curry powder, cloves, ginger, and chili powder. The items listed above may not be a complete list of foods and beverages to avoid. Contact your dietitian for more information.   This information is not intended to replace advice given to you by your health care provider. Make sure you discuss any questions you have with your health care provider.   Document Released: 02/22/2005 Document Revised: 03/15/2014 Document Reviewed: 12/27/2012 Elsevier Interactive Patient Education 2016 [place].    Gastroesophageal Reflux Disease, Adult Normally, food travels down the esophagus and stays in the stomach to be digested. However, when a person has gastroesophageal reflux disease (GERD), food and stomach acid move back up into the esophagus. When this happens, the esophagus becomes sore and inflamed. Over time, GERD can create small holes (ulcers) in the lining of the esophagus.  CAUSES This condition is caused by a problem with the muscle between the esophagus and the stomach (lower esophageal sphincter, or LES). Normally, the LES muscle closes after food passes through the esophagus to the stomach. When the LES is weakened or abnormal, it does not close properly, and that allows food and stomach acid to go back up into the esophagus. The LES can be weakened by certain dietary substances, medicines, and medical conditions, including:  Tobacco use.  Pregnancy.  Having a hiatal hernia.  Heavy alcohol use.  Certain foods and beverages, such as coffee, chocolate, onions, and peppermint. RISK FACTORS This condition is more likely to develop in:  People who have an increased body weight.  People who have connective tissue disorders.  People who use NSAID medicines. SYMPTOMS Symptoms of this condition include:  Heartburn.  Difficult or painful swallowing.  The feeling of having a lump in the throat.  Abitter taste in the mouth.  Bad breath.  Having a  large amount of saliva.  Having an upset or bloated stomach.  Belching.  Chest pain.  Shortness of breath or wheezing.  Ongoing (chronic) cough or a night-time cough.  Wearing away of tooth enamel.  Weight loss. Different conditions can cause chest pain. Make sure to see your health care provider if you experience chest pain. DIAGNOSIS Your health care provider will take a medical history and perform a physical exam. To determine if you have mild or severe GERD, your health care provider may also monitor how you respond to treatment. You may also have other tests, including:  An endoscopy toexamine your stomach and esophagus with a small camera.  A test thatmeasures the acidity level in your esophagus.  A test thatmeasures how much pressure is on your esophagus.  A barium swallow or modified barium swallow to show the shape, size, and functioning of your esophagus. TREATMENT The goal of treatment is to help relieve your symptoms and to prevent complications. Treatment for this condition may vary depending on how severe your symptoms are. Your health care provider may recommend:  Changes to your diet.  Medicine.  Surgery. HOME CARE INSTRUCTIONS Diet  Follow a diet as recommended by your health care provider. This may involve avoiding foods and drinks such as:  Coffee and tea (with or without caffeine).  Drinks that containalcohol.  Energy drinks and sports drinks.  Carbonated drinks or sodas.  Chocolate and cocoa.  Peppermint and mint flavorings.  Garlic and onions.  Horseradish.  Spicy and acidic foods, including peppers, chili powder, curry powder, vinegar, hot sauces, and barbecue sauce.  Citrus fruit juices and citrus fruits, such as oranges, lemons, and limes.  Tomato-based foods, such as red sauce, chili, salsa, and pizza with red sauce.  Fried and fatty foods, such as donuts, french fries, potato chips, and high-fat dressings.  High-fat meats,  such as hot dogs and fatty cuts of red and white meats, such as rib eye steak, sausage, ham, and bacon.  High-fat dairy items, such as whole milk, butter, and cream cheese.  Eat small, frequent meals instead of large meals.  Avoid drinking large amounts of liquid with your meals.  Avoid eating meals during the 2-3 hours before bedtime.  Avoid lying down right after you eat.  Do not exercise right after you eat. General Instructions  Pay attention to any changes in your symptoms.  Take over-the-counter and prescription medicines only as told by your health care provider. Do not take aspirin, ibuprofen, or other NSAIDs unless your health care provider told you to do so.  Do not use any tobacco products, including cigarettes, chewing tobacco, and e-cigarettes. If you need help quitting, ask your health care provider.  Wear loose-fitting clothing. Do not wear anything tight around your waist that causes pressure on your abdomen.  Raise (elevate) the head of your bed 6 inches (15cm).  Try to reduce your stress, such as with yoga or meditation. If you need help reducing stress, ask your health care provider.  If you are overweight, reduce your weight to an amount that is healthy for you. Ask your health care provider for guidance about a safe weight loss goal.  Keep all follow-up visits as told by your health care provider. This is important. SEEK MEDICAL CARE IF:  You have new symptoms.  You  have unexplained weight loss.  You have difficulty swallowing, or it hurts to swallow.  You have wheezing or a persistent cough.  Your symptoms do not improve with treatment.  You have a hoarse voice. SEEK IMMEDIATE MEDICAL CARE IF:  You have pain in your arms, neck, jaw, teeth, or back.  You feel sweaty, dizzy, or light-headed.  You have chest pain or shortness of breath.  You vomit and your vomit looks like blood or coffee grounds.  You faint.  Your stool is bloody or  black.  You cannot swallow, drink, or eat.   This information is not intended to replace advice given to you by your health care provider. Make sure you discuss any questions you have with your health care provider.   Document Released: 12/02/2004 Document Revised: 11/13/2014 Document Reviewed: 06/19/2014 Elsevier Interactive Patient Education [employer].

## 2015-12-11 ENCOUNTER — Telehealth: Payer: Self-pay | Admitting: Family Medicine

## 2015-12-11 MED ORDER — SUCRALFATE 1 G PO TABS: 1.0000 g | ORAL_TABLET | Freq: Three times a day (TID) | ORAL | 0 refills | Status: DC

## 2015-12-11 NOTE — Telephone Encounter (Signed)
Spoke with patient reviewed lab results and instructions. Patient verbalized understanding. 

## 2015-12-11 NOTE — Telephone Encounter (Signed)
Please call pt "Emily Mclean": - her labs looked normal.  - I have also added Carafate to her regimen to help coat the stomach.  - Followup in 4 weeks, sooner if pain is not improving at all or worsening.

## 2015-12-15 LAB — CP CHRONIC URTICARIA INDEX PANEL
Histamine Release: <16 % (ref <16)
THYROID PEROXIDASE ANTIBODY: 1 [IU]/mL (ref <9)
TSH: 5.83 m[IU]/L — AB

## 2015-12-21 ENCOUNTER — Other Ambulatory Visit: Payer: Self-pay | Admitting: Podiatry

## 2015-12-31 ENCOUNTER — Encounter: Payer: Self-pay | Admitting: Allergy

## 2016-01-13 ENCOUNTER — Encounter: Payer: Self-pay | Admitting: Family Medicine

## 2016-01-13 ENCOUNTER — Ambulatory Visit (INDEPENDENT_AMBULATORY_CARE_PROVIDER_SITE_OTHER): Payer: 59 | Admitting: Family Medicine

## 2016-01-13 VITALS — BP 111/75 | HR 71 | Temp 98.2°F | Resp 20 | Ht 64.0 in | Wt 164.2 lb

## 2016-01-13 DIAGNOSIS — R1013 Epigastric pain: Secondary | ICD-10-CM | POA: Diagnosis not present

## 2016-01-13 DIAGNOSIS — K219 Gastro-esophageal reflux disease without esophagitis: Secondary | ICD-10-CM | POA: Diagnosis not present

## 2016-01-13 DIAGNOSIS — G8929 Other chronic pain: Secondary | ICD-10-CM | POA: Diagnosis not present

## 2016-01-13 MED ORDER — OMEPRAZOLE 40 MG PO CPDR: 40.0000 mg | DELAYED_RELEASE_CAPSULE | ORAL | 0 refills | Status: DC | PRN

## 2016-01-13 NOTE — Progress Notes (Signed)
Emily Mclean , 19-Feb-1971, 45 y.o., female MRN: [id number] Patient Care Team    Relationship Specialty Notifications Start End  Ma Hillock, DO PCP - General Family Medicine  12/10/15     CC: GERD symtpoms Subjective:  Epigastric pain/GERD: Pt presents for epigastric pain follow up. She is doing well since starting PPI, Carafate and zantac about 4 weeks. She states her pain is completely resolved and she is having no other symptoms. She has been avoiding triggered foods, but has noticed she is now tolerating most foods again.    prior note:  pt reports about 6 days ago she started having epigastric pain that radiated to her back. She feels the pain is worse with "cold" fluids and the evening time. She does not feel it was associated with food. She reports she has been feeling horrible, low energy, nausea and increased belching. For the past couple of week she has also been breaking out in hives. She has seen an allergist, and currently they do not know cause. She is taking omeprazole OTC 40 mg daily for past 5 days, she was started on allegra, zantac and vistaril for hives. She states she has had a similar feeling when "stretching to much" and it feels like something gets stuck. She also states her bra can put to much pressure over the area and cause discomfort. She denies heartburn, sour taste. She has had mild hoarseness. She has not seen ENT or GI in the past for issues. Her lipid panel has been normal, she does not drink alcohol. She denies fever, chills, bowel changes or diarrhea. She does not use NSAIDS.   Allergies  Allergen Reactions  . Cefuroxime Axetil     REACTION: red rash  . Cephalexin     REACTION: red rash   Social History  Substance Use Topics  . Smoking status: Current Every Day Smoker    Packs/day: 0.50    Years: 19.00  . Smokeless tobacco: Never Used  . Alcohol use No   Past Medical History:  Diagnosis Date  . Allergy   . Heart murmur   . Urticaria     Past Surgical History:  Procedure Laterality Date  . NASAL SINUS SURGERY     polyps removed   Family History  Problem Relation Age of Onset  . Diabetes Mother   . Kidney disease Mother   . Depression Mother   . Allergic rhinitis Mother   . Diabetes Father   . Hypertension Father   . Heart disease Father   . Diabetes Brother   . Hypertension Brother   . Kidney disease Maternal Aunt   . Depression Maternal Grandfather     suicide  . Cancer Maternal Grandfather     lymphoma     Medication List       Accurate as of 01/13/16  9:00 AM. Always use your most recent med list.          azelastine 0.05 % ophthalmic solution Commonly known as:  OPTIVAR Place 1 drop into both eyes as needed.   EPINEPHrine 0.3 mg/0.3 mL Soaj injection Commonly known as:  EPI-PEN   fexofenadine 180 MG tablet Commonly known as:  ALLEGRA Take 180 mg by mouth daily.   fluticasone 50 MCG/ACT nasal spray Commonly known as:  FLONASE Place 1 spray into both nostrils daily.   hydrOXYzine 25 MG tablet Commonly known as:  ATARAX/VISTARIL Take 25 mg by mouth at bedtime.   montelukast 10 MG tablet Commonly  known as:  SINGULAIR Take 10 mg by mouth at bedtime.   omeprazole 40 MG capsule Commonly known as:  PRILOSEC Take 1 capsule (40 mg total) by mouth as needed.   ranitidine 150 MG tablet Commonly known as:  ZANTAC Take 1 tablet (150 mg total) by mouth 2 (two) times daily.   sucralfate 1 g tablet Commonly known as:  CARAFATE Take 1 tablet (1 g total) by mouth 4 (four) times daily -  with meals and at bedtime.   Vitamin D 2000 units Caps Take by mouth.       No results found for this or any previous visit (from the past 24 hour(s)). No results found.   ROS: Negative, with the exception of above mentioned in HPI   Objective:  BP 111/75 (BP Location: Right Arm, Patient Position: Sitting, Cuff Size: Normal)   Pulse 71   Temp 98.2 F (36.8 C)   Resp 20   Ht 5\' 4"  (1.626 m)   Wt  164 lb 4 oz (74.5 kg)   SpO2 98%   BMI 28.19 kg/m  Body mass index is 28.19 kg/m. Gen: Afebrile. No acute distress. Nontoxic in appearance, well developed, well nourished. Pleasant caucasian female.  HENT: AT. [place].  MMM Eyes:Pupils Equal Round Reactive to light, Extraocular movements intact,  Conjunctiva without redness, discharge or icterus. Abd: Soft. flat. ND. Mild pain left upper quad.  BS present. no Masses palpated. No rebound or guarding.  Skin: no rashes, purpura or petechiae. No skin changes.  Neuro: Normal gait. PERLA. EOMi. Alert. Oriented x3  Assessment/Plan: Emily Mclean is a 45 y.o. female present for acute OV for  Abdominal pain, chronic, epigastric/GERD - symptoms resolved, discussed full treat,ment of 8 weeks (4 additional weeks from now). She will then attempt to taper every other day on PPI.  - Continue to monitor diet. - omeprazole (PRILOSEC) 40 MG capsule; Take 1 capsule (40 mg total) by mouth as needed.  Dispense: 30 capsule; Refill: 0 - taper off Carafate. - continue zantac for GERD and mostly H1 blocker for hives.  - F/U PRN, or if abd pain worsens/returns  electronically signed by:  Howard Pouch, DO  [place]

## 2016-01-13 NOTE — Patient Instructions (Addendum)
Take PPI every day for another 4 weeks and then taper to every other day the following month if able. After 3 rd month of treatment you can try to discontinue all together if able. If not you may need the lower dose daily.  You can cut back on the carafate. Continue zantac twice a day.  Continue to watch diet for triggers and avoid them.

## 2016-03-05 ENCOUNTER — Ambulatory Visit: Payer: 59 | Admitting: Podiatry

## 2016-03-22 IMAGING — DX DG THORACIC SPINE 2V
3 series · 3 of 3 positions shown · non-contrast
Comparison: 01/01/2013

CLINICAL DATA: Back pain for 3 days, no known injury, initial
encounter

EXAM:
THORACIC SPINE - 2 VIEW

[t-spine ap]
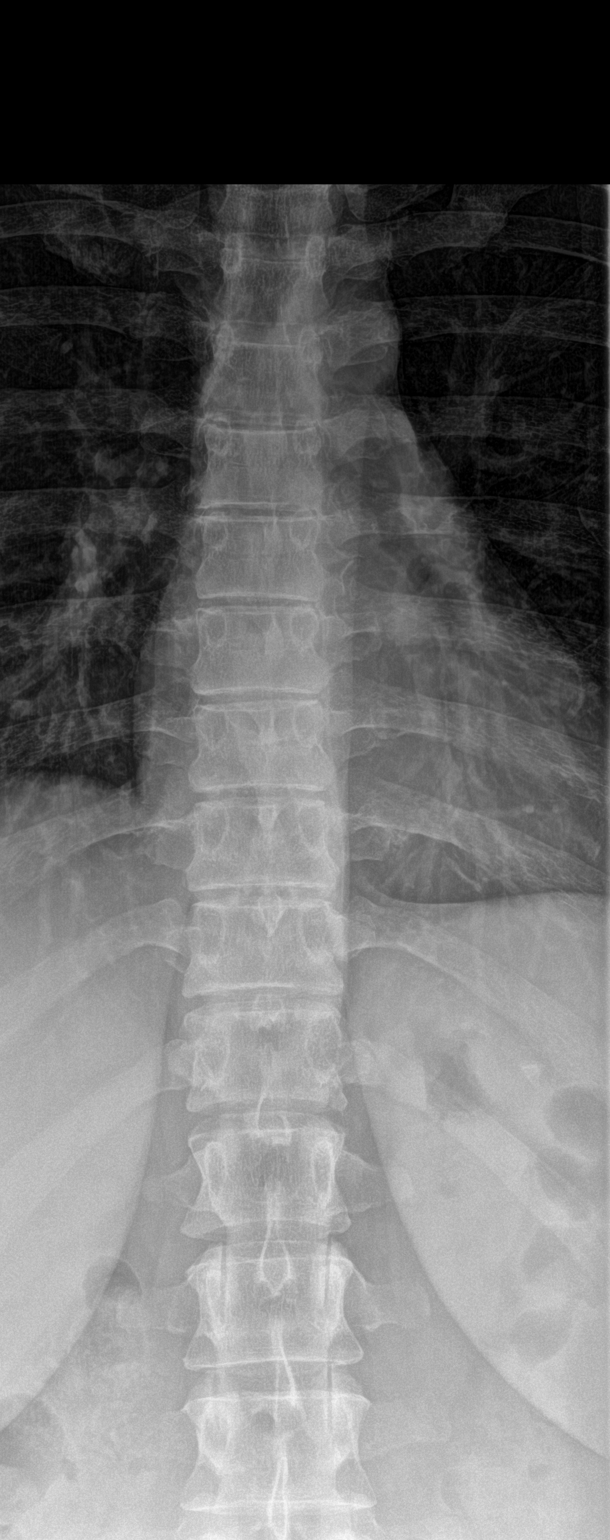

[t-spine lat]
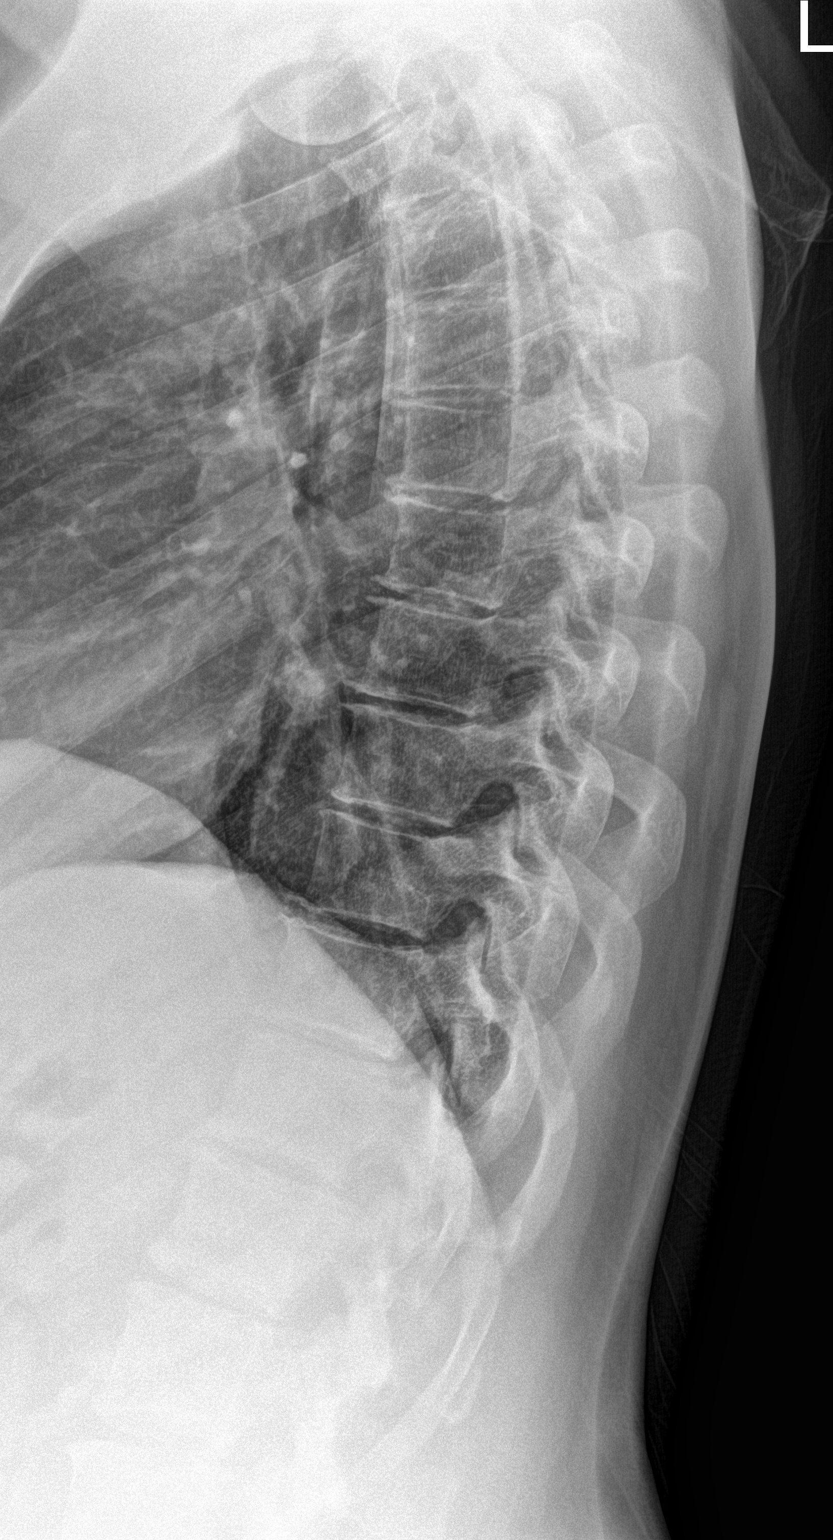

[t-spine swimmers]
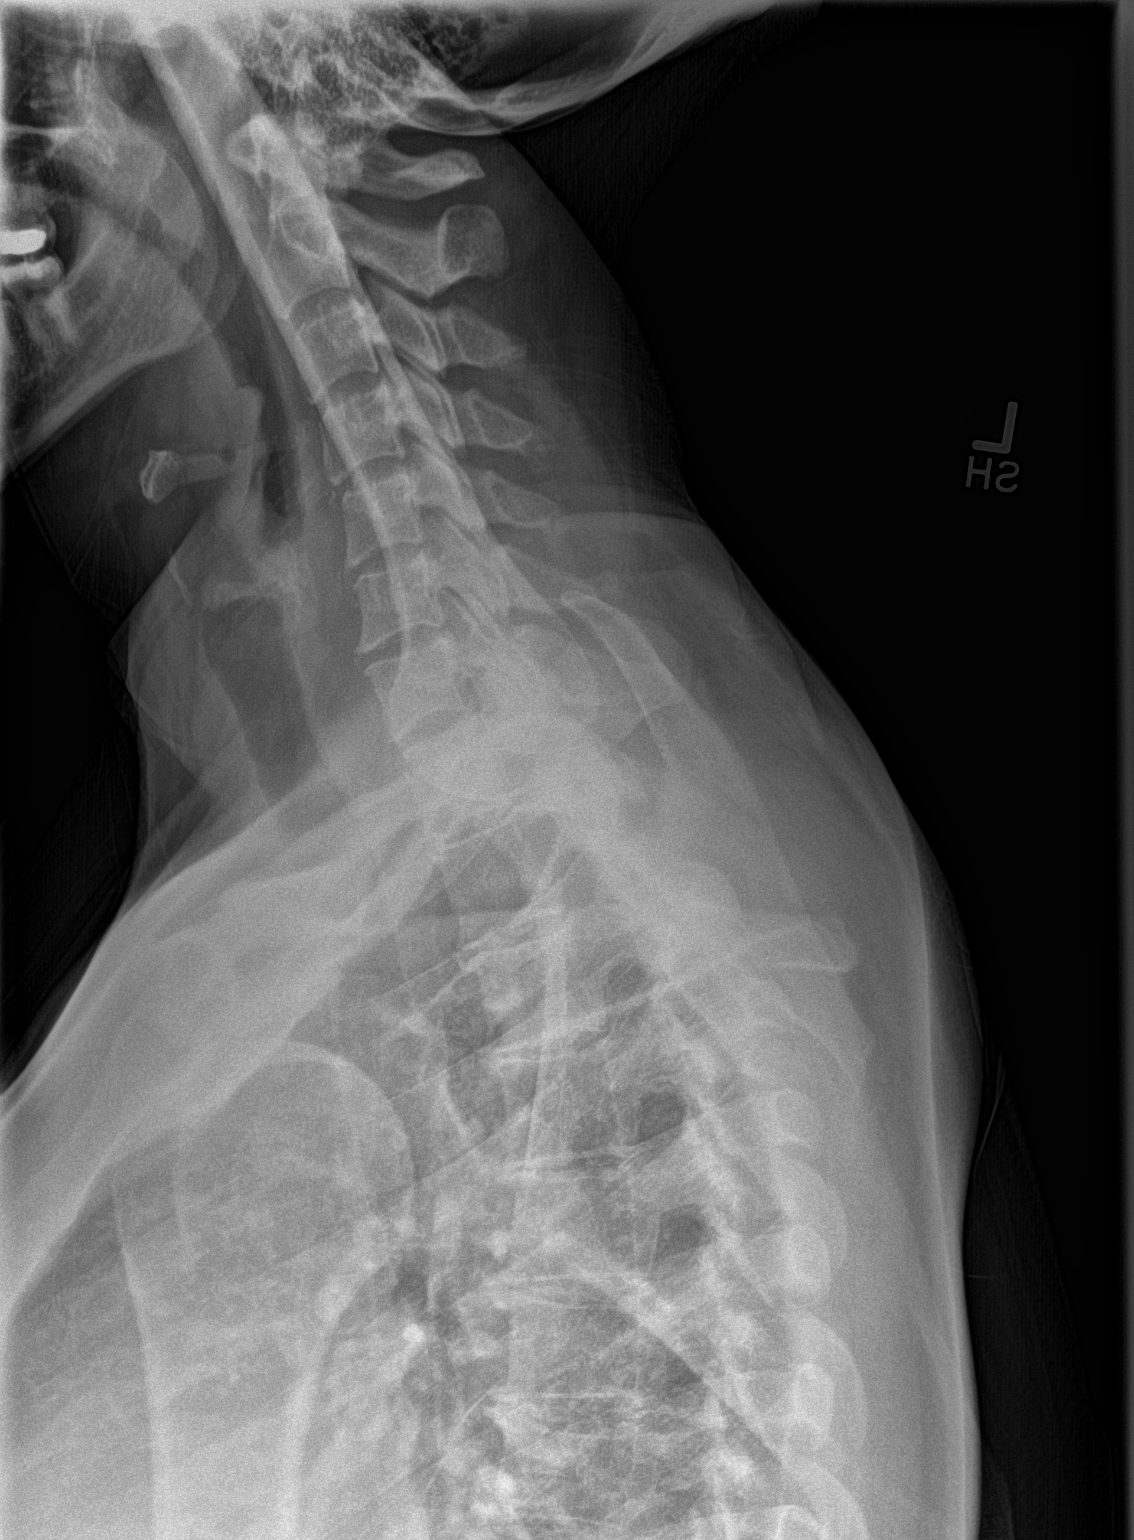

[3 of 3 positions shown; findings below may reference images not displayed]

FINDINGS: Mild osteophytic changes are noted. Vertebral body height is well
maintained. No paraspinal mass lesion is seen. No other focal
abnormality is noted.
IMPRESSION: Mild degenerative change without acute abnormality.

## 2016-12-01 ENCOUNTER — Telehealth: Payer: Self-pay | Admitting: Family Medicine

## 2016-12-01 ENCOUNTER — Encounter: Payer: Self-pay | Admitting: Gastroenterology

## 2016-12-01 DIAGNOSIS — G8929 Other chronic pain: Secondary | ICD-10-CM

## 2016-12-01 DIAGNOSIS — R1013 Epigastric pain: Principal | ICD-10-CM

## 2016-12-01 NOTE — Telephone Encounter (Signed)
Referral to [facility] per pt request, s/p ED ([facility]) visit for epigastric pain

## 2016-12-01 NOTE — Telephone Encounter (Signed)
Patient had a "stomach attack" last night. Went to [facility] ER and got GI referral. They set it up in [place]. Patient would prefer to go to [facility]. Can she get a referral?

## 2016-12-01 NOTE — Telephone Encounter (Signed)
I can put in the referral for [facility] for her, but I would need to know the resulting diagnosis of her ED visit. Since [facility] is not in our EMR, I do not have access to her ED visit. Is it the same as I treated her for in OCT/NOV last year? And the timeline they asked her to follow with GI

## 2016-12-01 NOTE — Telephone Encounter (Signed)
Patient returned call and stated that she was diagnosed with Epigastric pain. It was the same thing she was seen for before with Dr. Raoul Pitch but a lot worse per patient.

## 2016-12-01 NOTE — Telephone Encounter (Signed)
Left message for patient to return call and provide requested information.

## 2017-01-26 ENCOUNTER — Ambulatory Visit (INDEPENDENT_AMBULATORY_CARE_PROVIDER_SITE_OTHER): Payer: 59 | Admitting: Gastroenterology

## 2017-01-26 ENCOUNTER — Encounter: Payer: Self-pay | Admitting: Gastroenterology

## 2017-01-26 ENCOUNTER — Other Ambulatory Visit: Payer: Self-pay

## 2017-01-26 VITALS — BP 120/68 | HR 68 | Ht 64.0 in | Wt 168.4 lb

## 2017-01-26 DIAGNOSIS — G8929 Other chronic pain: Secondary | ICD-10-CM

## 2017-01-26 DIAGNOSIS — R1013 Epigastric pain: Secondary | ICD-10-CM

## 2017-01-26 MED ORDER — PANTOPRAZOLE SODIUM 40 MG PO TBEC: 40.0000 mg | DELAYED_RELEASE_TABLET | Freq: Two times a day (BID) | ORAL | 0 refills | Status: DC

## 2017-01-26 MED ORDER — PANTOPRAZOLE SODIUM 40 MG PO TBEC: 40.0000 mg | DELAYED_RELEASE_TABLET | Freq: Every day | ORAL | 0 refills | Status: DC

## 2017-01-26 NOTE — Patient Instructions (Signed)
If you are age 44 or older, your body mass index should be between 23-30. Your Body mass index is 28.91 kg/m. If this is out of the aforementioned range listed, please consider follow up with your Primary Care Provider.  If you are age 37 or younger, your body mass index should be between 19-25. Your Body mass index is 28.91 kg/m. If this is out of the aformentioned range listed, please consider follow up with your Primary Care Provider.   You have been scheduled for an endoscopy. Please follow written instructions given to you at your visit today. If you use inhalers (even only as needed), please bring them with you on the day of your procedure. Your physician has requested that you go to www.startemmi.com and enter the access code given to you at your visit today. This web site gives a general overview about your procedure. However, you should still follow specific instructions given to you by our office regarding your preparation for the procedure.  You have been scheduled for an abdominal ultrasound at [facility] (1st floor of hospital) on Friday, 02-04-17 at 9:00am. Please arrive 15 minutes prior to your appointment for registration. Make certain not to have anything to eat or drink after midnight. Should you need to reschedule your appointment, please contact radiology at [phone]. This test typically takes about 30 minutes to perform.  We have sent the following medications to your pharmacy for you to pick up at your convenience: Protonix 40mg : Take twice a day for 3 months  Please discontinue taking Prilosec.  Thank you.

## 2017-01-26 NOTE — Progress Notes (Signed)
HPI :  46 y/o female with a history of seasonal allergies and urticaria, referred here by Howard Pouch for a new patient visit for abdominal pain.  The patient reports she has had epigastric pain for the past 6 years. She states this happens once every 2 months or so. She feels episodes are reliably preceded by eating something she is sensitive to, such as tomato based foods. She reports usually within an hour or 2 eating these foods she has severe epigastric pain. Pain does not radiate to the right upper quadrant or to the shoulder. She has some nausea with the pain but not much vomiting. She denies any overt heartburn or regurgitation during this time. She denies any dysphagia or odynophagia. Symptoms can last for several hours upwards of days before she feels better. She has taken Prilosec as needed usually around the time of the episode. She has not tried high-dose preventative therapy at. She endorses rare use of ibuprofen but not routinely. She reports her bowel habits can be irregular at times. Sometimes constipated, sometimes on the looser side depending on what she eats. She does endorse some increased gas and bloating at times. No blood in the stools. No family history of colon cancer or inflammatory bowel disease or celiac disease. She otherwise thinks rarely her pain may be induced with certain movements, although hard to reproduce her symptoms by doing this.  She had an ultrasound done remotely around 2011, unclear if she was having symptoms at that time. No gallstones noted. H. pylori serology negative in October of last year  She's never had a prior endoscopy.  Past Medical History:  Diagnosis Date  . Allergy   . Heart murmur   . Urticaria      Past Surgical History:  Procedure Laterality Date  . NASAL SINUS SURGERY     polyps removed   Family History  Problem Relation Age of Onset  . Diabetes Mother   . Kidney disease Mother   . Depression Mother   . Allergic rhinitis  Mother   . Diabetes Father   . Hypertension Father   . Heart disease Father   . Diabetes Brother   . Hypertension Brother   . Kidney disease Maternal Aunt   . Depression Maternal Grandfather        suicide  . Cancer Maternal Grandfather        lymphoma   Social History   Tobacco Use  . Smoking status: Current Every Day Smoker    Packs/day: 0.50    Years: 19.00    Pack years: 9.50  . Smokeless tobacco: Never Used  Substance Use Topics  . Alcohol use: No  . Drug use: No   Current Outpatient Medications  Medication Sig Dispense Refill  . azelastine (OPTIVAR) 0.05 % ophthalmic solution Place 1 drop into both eyes as needed.     . Cholecalciferol (VITAMIN D) 2000 UNITS CAPS Take by mouth.    . EPINEPHrine 0.3 mg/0.3 mL IJ SOAJ injection     . fexofenadine (ALLEGRA) 180 MG tablet Take 180 mg by mouth daily.    . fluticasone (FLONASE) 50 MCG/ACT nasal spray Place 1 spray into both nostrils daily.     . montelukast (SINGULAIR) 10 MG tablet Take 10 mg by mouth at bedtime.     Marland Kitchen omeprazole (PRILOSEC) 40 MG capsule Take 1 capsule (40 mg total) by mouth as needed. 30 capsule 0  . sucralfate (CARAFATE) 1 g tablet Take 1 tablet (1 g total)  by mouth 4 (four) times daily -  with meals and at bedtime. 120 tablet 0   No current facility-administered medications for this visit.    Allergies  Allergen Reactions  . Cefuroxime Axetil     REACTION: red rash  . Cephalexin     REACTION: red rash     Review of Systems: All systems reviewed and negative except where noted in HPI.   Labs in Care everywhere reviewed.  Lab Results  Component Value Date   WBC 14.1 (H) 12/08/2015   HGB 14.5 12/08/2015   HCT 43.2 12/08/2015   MCV 94.5 12/08/2015   PLT 482 (H) 12/08/2015    Lab Results  Component Value Date   CREATININE 0.67 12/10/2015   BUN 6 12/10/2015   NA 140 12/10/2015   K 4.6 12/10/2015   CL 105 12/10/2015   CO2 31 12/10/2015    Lab Results  Component Value Date   ALT 17  12/10/2015   AST 15 12/10/2015   ALKPHOS 59 12/10/2015   BILITOT 0.3 12/10/2015     Physical Exam: BP 120/68   Pulse 68   Ht 5\' 4"  (1.626 m)   Wt 168 lb 6.4 oz (76.4 kg)   LMP  (LMP Unknown)   BMI 28.91 kg/m  Constitutional: Pleasant,well-developed, female in no acute distress. HEENT: Normocephalic and atraumatic. Conjunctivae are normal. No scleral icterus. Neck supple.  Cardiovascular: Normal rate, regular rhythm.  Pulmonary/chest: Effort normal and breath sounds normal. No wheezing, rales or rhonchi. Abdominal: Soft, nondistended, nontender.  Could not reproduce symptoms.  There are no masses palpable. No hepatomegaly. Extremities: no edema Lymphadenopathy: No cervical adenopathy noted. Neurological: Alert and oriented to person place and time. Skin: Skin is warm and dry. No rashes noted. Psychiatric: Normal mood and affect. Behavior is normal.   ASSESSMENT AND PLAN: 46 year old female with intermittent severe postprandial epigastric pain, as outlined above.  I discussed differential for her symptoms with her to include reflux esophagitis, PUD / gastritis, biliary colic, pancreatic pathology, musculoskeletal, functional, etc, although her symptoms are not classic for any of these. Given the duration of her symptoms and postprandial nature, I think an upper endoscopy is reasonable - rule out erosive changes, rule out H. Pylori/PUD, rule out celiac disease. I discussed risks and benefits of upper endoscopy with her and she wanted to proceed. In the interim I will repeat a limited ultrasound right upper quadrant to rule out gallstones, given it is not clear if her symptoms were present at the time of her last ultrasound. I otherwise recommend a trial of high-dose PPI for a few months, to see if this will help prevent any future episodes. Will try Protonix 40 mg twice daily. If her symptoms fail to respond to PPI, EGD is normal, ultrasound is normal, we then we'll consider  cross-sectional imaging with CT scan. She agreed with the plan.  Sanford Cellar, MD [facility] Pager [phone]  CC: Ma Hillock, DO

## 2017-02-04 ENCOUNTER — Encounter: Payer: Self-pay | Admitting: Gastroenterology

## 2017-02-04 ENCOUNTER — Ambulatory Visit (HOSPITAL_COMMUNITY)
Admission: RE | Admit: 2017-02-04 | Discharge: 2017-02-04 | Disposition: A | Discharge status: Home / Self Care | Payer: 59 | Source: Ambulatory Visit | Attending: Gastroenterology | Admitting: Gastroenterology

## 2017-02-04 DIAGNOSIS — G8929 Other chronic pain: Secondary | ICD-10-CM | POA: Diagnosis present

## 2017-02-04 DIAGNOSIS — R1013 Epigastric pain: Secondary | ICD-10-CM | POA: Insufficient documentation

## 2017-02-11 ENCOUNTER — Ambulatory Visit (AMBULATORY_SURGERY_CENTER): Payer: 59 | Admitting: Gastroenterology

## 2017-02-11 ENCOUNTER — Encounter: Payer: Self-pay | Admitting: Gastroenterology

## 2017-02-11 VITALS — BP 140/81 | HR 64 | Temp 97.3°F | Resp 19 | Ht 64.0 in | Wt 168.0 lb

## 2017-02-11 DIAGNOSIS — K2980 Duodenitis without bleeding: Secondary | ICD-10-CM | POA: Diagnosis not present

## 2017-02-11 DIAGNOSIS — K317 Polyp of stomach and duodenum: Secondary | ICD-10-CM | POA: Diagnosis not present

## 2017-02-11 DIAGNOSIS — K2990 Gastroduodenitis, unspecified, without bleeding: Secondary | ICD-10-CM

## 2017-02-11 DIAGNOSIS — K2970 Gastritis, unspecified, without bleeding: Secondary | ICD-10-CM | POA: Diagnosis not present

## 2017-02-11 DIAGNOSIS — R1013 Epigastric pain: Secondary | ICD-10-CM | POA: Diagnosis not present

## 2017-02-11 DIAGNOSIS — K2950 Unspecified chronic gastritis without bleeding: Secondary | ICD-10-CM | POA: Diagnosis not present

## 2017-02-11 MED ORDER — SODIUM CHLORIDE 0.9 % IV SOLN: 500.0000 mL | Freq: Once | INTRAVENOUS | Status: DC

## 2017-02-11 NOTE — Op Note (Signed)
[place] Patient Name: Emily Mclean Procedure Date: 02/11/2017 9:55 AM MRN: [id number] Endoscopist: Remo Lipps P. Armbruster MD, MD Age: 46 Referring MD:  Date of Birth: 08/07/1970 Gender: Female Account #: [id number] Procedure:                Upper GI endoscopy Indications:              Epigastric abdominal pain, partially improved with                            high dose protonix. [place] negative for gallstones. Medicines:                Monitored Anesthesia Care Procedure:                Pre-Anesthesia Assessment:                           - Prior to the procedure, a History and Physical                            was performed, and patient medications and                            allergies were reviewed. The patient's tolerance of                            previous anesthesia was also reviewed. The risks                            and benefits of the procedure and the sedation                            options and risks were discussed with the patient.                            All questions were answered, and informed consent                            was obtained. Prior Anticoagulants: The patient has                            taken no previous anticoagulant or antiplatelet                            agents. ASA Grade Assessment: II - A patient with                            mild systemic disease. After reviewing the risks                            and benefits, the patient was deemed in                            satisfactory condition to undergo the procedure.  After obtaining informed consent, the endoscope was                            passed under direct vision. Throughout the                            procedure, the patient's blood pressure, pulse, and                            oxygen saturations were monitored continuously. The                            Endoscope was introduced through the mouth, and      advanced to the second part of duodenum. The upper                            GI endoscopy was accomplished without difficulty.                            The patient tolerated the procedure well. Scope In: Scope Out: Findings:                 Esophagogastric landmarks were identified: the                            Z-line was found at 35 cm, the gastroesophageal                            junction was found at 35 cm and the upper extent of                            the gastric folds was found at 35 cm from the                            incisors.                           The exam of the esophagus was otherwise normal.                           Patchy mild inflammation characterized by erythema                            was found in the gastric fundus, in the gastric                            body and in the gastric antrum. Biopsies were taken                            with a cold forceps for Helicobacter pylori testing.                           The exam of the stomach was otherwise normal.  A single diminutive sessile polyp was found in the                            second portion of the duodenum. The polyp was                            removed with a cold biopsy forceps. Resection and                            retrieval were complete.                           The exam of the duodenum was otherwise normal.                           Biopsies for histology were taken with a cold                            forceps in the duodenal bulb and in the second                            portion of the duodenum for evaluation of celiac                            disease. Complications:            No immediate complications. Estimated blood loss:                            Minimal. Estimated Blood Loss:     Estimated blood loss was minimal. Impression:               - Esophagogastric landmarks identified.                           - Normal esophagus.                            - Mild gastritis. Biopsied.                           - A single benign appearing duodenal polyp.                            Resected and retrieved.                           - Biopsies were taken with a cold forceps for                            evaluation of celiac disease. Recommendation:           - Patient has a contact number available for                            emergencies. The signs and symptoms of potential  delayed complications were discussed with the                            patient. Return to normal activities tomorrow.                            Written discharge instructions were provided to the                            patient.                           - Resume previous diet.                           - Continue present medications.                           - Await pathology results. Remo Lipps P. Armbruster MD, MD 02/11/2017 10:21:13 AM This report has been signed electronically.

## 2017-02-11 NOTE — Progress Notes (Signed)
To PACU, VSS. Report to [email or web address] 

## 2017-02-11 NOTE — Patient Instructions (Signed)
YOU HAD AN ENDOSCOPIC PROCEDURE TODAY AT [facility]:   Refer to the procedure report that was given to you for any specific questions about what was found during the examination.  If the procedure report does not answer your questions, please call your gastroenterologist to clarify.  If you requested that your care partner not be given the details of your procedure findings, then the procedure report has been included in a sealed envelope for you to review at your convenience later.  YOU SHOULD EXPECT: Some feelings of bloating in the abdomen. Passage of more gas than usual.  Walking can help get rid of the air that was put into your GI tract during the procedure and reduce the bloating. If you had a lower endoscopy (such as a colonoscopy or flexible sigmoidoscopy) you may notice spotting of blood in your stool or on the toilet paper. If you underwent a bowel prep for your procedure, you may not have a normal bowel movement for a few days.  Please Note:  You might notice some irritation and congestion in your nose or some drainage.  This is from the oxygen used during your procedure.  There is no need for concern and it should clear up in a day or so.  SYMPTOMS TO REPORT IMMEDIATELY:    Following upper endoscopy (EGD)  Vomiting of blood or coffee ground material  New chest pain or pain under the shoulder blades  Painful or persistently difficult swallowing  New shortness of breath  Fever of 100F or higher  Black, tarry-looking stools  Please read all handouts given to you by your recovery nurse today. Gastritis  For urgent or emergent issues, a gastroenterologist can be reached at any hour by calling [phone].   DIET:  We do recommend a small meal at first, but then you may proceed to your regular diet.  Drink plenty of fluids but you should avoid alcoholic beverages for 24 hours.  ACTIVITY:  You should plan to take it easy for the rest of today and you should NOT  DRIVE or use heavy machinery until tomorrow (because of the sedation medicines used during the test).    FOLLOW UP: Our staff will call the number listed on your records the next business day following your procedure to check on you and address any questions or concerns that you may have regarding the information given to you following your procedure. If we do not reach you, we will leave a message.  However, if you are feeling well and you are not experiencing any problems, there is no need to return our call.  We will assume that you have returned to your regular daily activities without incident.  If any biopsies were taken you will be contacted by phone or by letter within the next 1-3 weeks.  Please call us at [phone] if you have not heard about the biopsies in 3 weeks.    SIGNATURES/CONFIDENTIALITY: You and/or your care partner have signed paperwork which will be entered into your electronic medical record.  These signatures attest to the fact that that the information above on your After Visit Summary has been reviewed and is understood.  Full responsibility of the confidentiality of this discharge information lies with you and/or your care-partner.  Thank you for letting us take care of your healthcare needs today.

## 2017-02-11 NOTE — Progress Notes (Signed)
Pt's states no medical or surgical changes since previsit or office visit. 

## 2017-02-11 NOTE — Progress Notes (Signed)
Called to room to assist during endoscopic procedure.  Patient ID and intended procedure confirmed with present staff. Received instructions for my participation in the procedure from the performing physician.  

## 2017-02-16 ENCOUNTER — Telehealth: Payer: Self-pay | Admitting: *Deleted

## 2017-02-16 NOTE — Telephone Encounter (Signed)
  Follow up Call-  Call back number 02/11/2017  Post procedure Call Back phone  # [phone]  Permission to leave phone message Yes  Some recent data might be hidden     Patient questions:  Do you have a fever, pain , or abdominal swelling? No. Pain Score  0 *  Have you tolerated food without any problems? Yes.    Have you been able to return to your normal activities? Yes.    Do you have any questions about your discharge instructions: Diet   No. Medications  No. Follow up visit  No.  Do you have questions or concerns about your Care? No.  Actions: * If pain score is 4 or above: No action needed, pain <4.

## 2017-02-25 ENCOUNTER — Other Ambulatory Visit: Payer: Self-pay

## 2017-02-25 DIAGNOSIS — R109 Unspecified abdominal pain: Secondary | ICD-10-CM

## 2017-04-26 ENCOUNTER — Other Ambulatory Visit: Payer: Self-pay

## 2017-04-26 MED ORDER — PANTOPRAZOLE SODIUM 40 MG PO TBEC: 40.0000 mg | DELAYED_RELEASE_TABLET | Freq: Every day | ORAL | 0 refills | Status: DC

## 2017-04-26 NOTE — Progress Notes (Signed)
Spoke to pt.  She has not been taking Pantoprazole BID; she Only takes occasionally.  Would like an Rx for once a day but that should last her a long time.  Sending Rx to [facility] in [place].

## 2017-07-17 ENCOUNTER — Other Ambulatory Visit: Payer: Self-pay | Admitting: Gastroenterology

## 2017-10-03 ENCOUNTER — Encounter: Payer: Self-pay | Admitting: Family Medicine

## 2017-10-03 ENCOUNTER — Ambulatory Visit: Payer: BLUE CROSS/BLUE SHIELD | Admitting: Family Medicine

## 2017-10-03 VITALS — BP 105/70 | HR 70 | Temp 98.2°F | Resp 20 | Ht 64.0 in | Wt 170.0 lb

## 2017-10-03 DIAGNOSIS — R21 Rash and other nonspecific skin eruption: Secondary | ICD-10-CM

## 2017-10-03 MED ORDER — VALACYCLOVIR HCL 1 G PO TABS: 1000.0000 mg | ORAL_TABLET | Freq: Three times a day (TID) | ORAL | 0 refills | Status: DC

## 2017-10-03 MED ORDER — PREDNISONE 20 MG PO TABS: ORAL_TABLET | ORAL | 0 refills | Status: DC

## 2017-10-03 NOTE — Progress Notes (Signed)
[place] , Apr 05, 1970, 47 y.o., female MRN: [id number] Patient Care Team    Relationship Specialty Notifications Start End  Ma Hillock, DO PCP - General Family Medicine  12/10/15     Chief Complaint  Patient presents with  . Rash    possible shingles     Subjective: Pt presents for an OV with complaints of rash of 5 days duration.  Associated symptoms include painful blisters and fatigue. She states she completed an E-Visit and was diagnosed with shingles. She was placed on valtrex BID and start this 2 days ago. She has an area on her left foot medial metatarsal that was the initial location. She has another area on her left shin. She has discomfort in her left leg and knee. Her knee is mildly swollen, she is uncertain if it was from all the walking on vacation or associated with rash. She denies fever, headaches or rash in other locations.    No flowsheet data found.  Allergies  Allergen Reactions  . Cefuroxime Axetil     REACTION: red rash  . Cephalexin     REACTION: red rash   Social History   Tobacco Use  . Smoking status: Current Every Day Smoker    Packs/day: 0.50    Years: 19.00    Pack years: 9.50  . Smokeless tobacco: Never Used  Substance Use Topics  . Alcohol use: No   Past Medical History:  Diagnosis Date  . Allergy   . Heart murmur   . Urticaria    Past Surgical History:  Procedure Laterality Date  . NASAL SINUS SURGERY     polyps removed   Family History  Problem Relation Age of Onset  . Diabetes Mother   . Kidney disease Mother   . Depression Mother   . Allergic rhinitis Mother   . Diabetes Father   . Hypertension Father   . Heart disease Father   . Diabetes Brother   . Hypertension Brother   . Kidney disease Maternal Aunt   . Depression Maternal Grandfather        suicide  . Cancer Maternal Grandfather        lymphoma   Allergies as of 10/03/2017      Reactions   Cefuroxime Axetil    REACTION: red rash   Cephalexin     REACTION: red rash      Medication List        Accurate as of 10/03/17  1:48 PM. Always use your most recent med list.          azelastine 0.05 % ophthalmic solution Commonly known as:  OPTIVAR Place 1 drop into both eyes as needed.   EPINEPHrine 0.3 mg/0.3 mL Soaj injection Commonly known as:  EPI-PEN   fexofenadine 180 MG tablet Commonly known as:  ALLEGRA Take 180 mg by mouth daily.   fluticasone 50 MCG/ACT nasal spray Commonly known as:  FLONASE Place 1 spray into both nostrils daily.   montelukast 10 MG tablet Commonly known as:  SINGULAIR Take 10 mg by mouth at bedtime.   pantoprazole 40 MG tablet Commonly known as:  PROTONIX TAKE 1 TABLET BY MOUTH EVERY DAY   sucralfate 1 g tablet Commonly known as:  CARAFATE Take 1 tablet (1 g total) by mouth 4 (four) times daily -  with meals and at bedtime.   Vitamin D 2000 units Caps Take by mouth.       All past medical history, surgical history,  allergies, family history, immunizations andmedications were updated in the EMR today and reviewed under the history and medication portions of their EMR.     ROS: Negative, with the exception of above mentioned in HPI   Objective:  BP 105/70 (BP Location: Left Arm, Patient Position: Sitting, Cuff Size: Large)   Pulse 70   Temp 98.2 F (36.8 C)   Resp 20   Ht 5\' 4"  (1.626 m)   Wt 170 lb (77.1 kg)   SpO2 97%   BMI 29.18 kg/m  Body mass index is 29.18 kg/m. Gen: Afebrile. No acute distress. Nontoxic in appearance, well developed, well nourished.  HENT: AT. [place]. MMM Eyes:Pupils Equal Round Reactive to light, Extraocular movements intact,  Conjunctiva without redness, discharge or icterus. Skin: quarter size rash x2 with erythema base and blisters within (left shin and left medial metatarsal)rashes, purpura or petechiae.  Neuro/msk: Normal gait. PERLA. EOMi. Alert. Oriented x3. Mild effusion present left knee, no pain or soft tissue swelling. FROM. No ligament  laxity. NV intact distally.  Psych: Normal affect, dress and demeanor. Normal speech. Normal thought content and judgment.  No exam data present No results found. No results found for this or any previous visit (from the past 24 hour(s)).  Assessment/Plan: Emily Mclean is a 47 y.o. female present for OV for  Rash - odd presentation/location, but looks consistent with shingles. Concern over left knee with mild effusion.  - Monitor closely, if knee worsens consider other causes or infectious causes. Could be secondary to walking more.  - valtrex extended to have TID dose for 7 days total.  - predniSONE (DELTASONE) 20 MG tablet; 60 mg x3d, 40 mg x3d, 20 mg x2d, 10 mg x2d  Dispense: 18 tablet; Refill: 0 - F/U 1 week if not improving.    Reviewed expectations re: course of current medical issues.  Discussed self-management of symptoms.  Outlined signs and symptoms indicating need for more acute intervention.  Patient verbalized understanding and all questions were answered.  Patient received an After-Visit Summary.    No orders of the defined types were placed in this encounter.    Note is dictated utilizing voice recognition software. Although note has been proof read prior to signing, occasional typographical errors still can be missed. If any questions arise, please do not hesitate to call for verification.   electronically signed by:  Howard Pouch, DO  [place]

## 2017-10-03 NOTE — Patient Instructions (Signed)
If worsening follow up within the week, especially if knee worsens or fever.  Take the valtrex every 8 hours  Start prednisone as instructed.    Shingles Shingles is an infection that causes a painful skin rash and fluid-filled blisters. Shingles is caused by the same virus that causes chickenpox. Shingles only develops in people who:  Have had chickenpox.  Have gotten the chickenpox vaccine. (This is rare.)  The first symptoms of shingles may be itching, tingling, or pain in an area on your skin. A rash will follow in a few days or weeks. The rash is usually on one side of the body in a bandlike or beltlike pattern. Over time, the rash turns into fluid-filled blisters that break open, scab over, and dry up. Medicines may:  Help you manage pain.  Help you recover more quickly.  Help to prevent long-term problems.  Follow these instructions at home: Medicines  Take medicines only as told by your doctor.  Apply an anti-itch or numbing cream to the affected area as told by your doctor. Blister and Rash Care  Take a cool bath or put cool compresses on the area of the rash or blisters as told by your doctor. This may help with pain and itching.  Keep your rash covered with a loose bandage (dressing). Wear loose-fitting clothing.  Keep your rash and blisters clean with mild soap and cool water or as told by your doctor.  Check your rash every day for signs of infection. These include redness, swelling, and pain that lasts or gets worse.  Do not pick your blisters.  Do not scratch your rash. General instructions  Rest as told by your doctor.  Keep all follow-up visits as told by your doctor. This is important.  Until your blisters scab over, your infection can cause chickenpox in people who have never had it or been vaccinated against it. To prevent this from happening, avoid touching other people or being around other people, especially: ? Babies. ? Pregnant  women. ? Children who have eczema. ? Elderly people who have transplants. ? People who have chronic illnesses, such as leukemia or AIDS. Contact a doctor if:  Your pain does not get better with medicine.  Your pain does not get better after the rash heals.  Your rash looks infected. Signs of infection include: ? Redness. ? Swelling. ? Pain that lasts or gets worse. Get help right away if:  The rash is on your face or nose.  You have pain in your face, pain around your eye area, or loss of feeling on one side of your face.  You have ear pain or you have ringing in your ear.  You have loss of taste.  Your condition gets worse. This information is not intended to replace advice given to you by your health care provider. Make sure you discuss any questions you have with your health care provider. Document Released: 08/11/2007 Document Revised: 10/19/2015 Document Reviewed: 12/04/2013 Elsevier Interactive Patient Education  [employer].

## 2017-10-07 DIAGNOSIS — Z1231 Encounter for screening mammogram for malignant neoplasm of breast: Secondary | ICD-10-CM | POA: Diagnosis not present

## 2017-10-07 DIAGNOSIS — Z01419 Encounter for gynecological examination (general) (routine) without abnormal findings: Secondary | ICD-10-CM | POA: Diagnosis not present

## 2017-10-07 DIAGNOSIS — Z6827 Body mass index (BMI) 27.0-27.9, adult: Secondary | ICD-10-CM | POA: Diagnosis not present

## 2017-11-08 DIAGNOSIS — J3089 Other allergic rhinitis: Secondary | ICD-10-CM | POA: Diagnosis not present

## 2017-11-08 DIAGNOSIS — R05 Cough: Secondary | ICD-10-CM | POA: Diagnosis not present

## 2017-11-08 DIAGNOSIS — J301 Allergic rhinitis due to pollen: Secondary | ICD-10-CM | POA: Diagnosis not present

## 2017-11-08 DIAGNOSIS — H1045 Other chronic allergic conjunctivitis: Secondary | ICD-10-CM | POA: Diagnosis not present

## 2018-01-04 DIAGNOSIS — Z30433 Encounter for removal and reinsertion of intrauterine contraceptive device: Secondary | ICD-10-CM | POA: Diagnosis not present

## 2018-01-25 ENCOUNTER — Encounter: Payer: Self-pay | Admitting: *Deleted

## 2018-02-07 ENCOUNTER — Telehealth: Payer: Self-pay

## 2018-02-07 DIAGNOSIS — K824 Cholesterolosis of gallbladder: Secondary | ICD-10-CM

## 2018-02-07 DIAGNOSIS — R109 Unspecified abdominal pain: Secondary | ICD-10-CM

## 2018-02-07 NOTE — Telephone Encounter (Signed)
Called and spoke to pt. She would like to schedule Ultrasound for next week but can't do Monday or Wednesday.  The following week she can do any day but Monday. Order is in.

## 2018-02-07 NOTE — Telephone Encounter (Signed)
-----   Message from Roetta Sessions, [place] sent at 02/07/2017  9:04 AM EST ----- Regarding: repeat RUQ U/S due 02-2018 From 02-07-17:   Jan can you please let this patient know her US shows no gallstones, making her gallbladder a less likely cause of her symptoms. She has a small possible gallbladder polyp noted. We may consider repeating an [place] in 1 year for reassessment, ensure no interval growth, if you can place a recall. We will await her scheduled EGD. Thanks

## 2018-02-08 NOTE — Telephone Encounter (Signed)
Scheduled pt for Tuesday, 02-14-18 at 8:30am, to arrive at 8:15am. NPO after midnight.   LM for pt with details of appt and requested a call back to confirm/discuss.

## 2018-02-08 NOTE — Telephone Encounter (Signed)
Patient called back.  She can make the appt next week on Tuesday the 10th. She understands to arrive at 8:15am and NPO after midnight.

## 2018-02-14 ENCOUNTER — Ambulatory Visit (HOSPITAL_COMMUNITY)
Admission: RE | Admit: 2018-02-14 | Discharge: 2018-02-14 | Disposition: A | Discharge status: Home / Self Care | Payer: BLUE CROSS/BLUE SHIELD | Source: Ambulatory Visit | Attending: Gastroenterology | Admitting: Gastroenterology

## 2018-02-14 DIAGNOSIS — K824 Cholesterolosis of gallbladder: Secondary | ICD-10-CM | POA: Insufficient documentation

## 2018-02-14 DIAGNOSIS — R109 Unspecified abdominal pain: Secondary | ICD-10-CM | POA: Insufficient documentation

## 2018-02-16 DIAGNOSIS — J111 Influenza due to unidentified influenza virus with other respiratory manifestations: Secondary | ICD-10-CM | POA: Diagnosis not present

## 2018-02-20 DIAGNOSIS — N939 Abnormal uterine and vaginal bleeding, unspecified: Secondary | ICD-10-CM | POA: Diagnosis not present

## 2018-06-19 ENCOUNTER — Encounter: Payer: Self-pay | Admitting: Family Medicine

## 2018-11-04 ENCOUNTER — Encounter (HOSPITAL_BASED_OUTPATIENT_CLINIC_OR_DEPARTMENT_OTHER): Payer: Self-pay | Admitting: *Deleted

## 2018-11-04 ENCOUNTER — Emergency Department (HOSPITAL_BASED_OUTPATIENT_CLINIC_OR_DEPARTMENT_OTHER)
Admission: EM | Admit: 2018-11-04 | Discharge: 2018-11-04 | Disposition: A | Discharge status: Home / Self Care | Payer: BC Managed Care – PPO | Attending: Emergency Medicine | Admitting: Emergency Medicine

## 2018-11-04 ENCOUNTER — Emergency Department (HOSPITAL_BASED_OUTPATIENT_CLINIC_OR_DEPARTMENT_OTHER): Payer: BC Managed Care – PPO

## 2018-11-04 ENCOUNTER — Other Ambulatory Visit: Payer: Self-pay

## 2018-11-04 DIAGNOSIS — F17210 Nicotine dependence, cigarettes, uncomplicated: Secondary | ICD-10-CM | POA: Diagnosis not present

## 2018-11-04 DIAGNOSIS — R1011 Right upper quadrant pain: Secondary | ICD-10-CM | POA: Insufficient documentation

## 2018-11-04 DIAGNOSIS — Z888 Allergy status to other drugs, medicaments and biological substances status: Secondary | ICD-10-CM | POA: Diagnosis not present

## 2018-11-04 DIAGNOSIS — Z881 Allergy status to other antibiotic agents status: Secondary | ICD-10-CM | POA: Diagnosis not present

## 2018-11-04 DIAGNOSIS — Z79899 Other long term (current) drug therapy: Secondary | ICD-10-CM | POA: Insufficient documentation

## 2018-11-04 LAB — URINALYSIS, ROUTINE W REFLEX MICROSCOPIC
Bilirubin Urine: NEGATIVE
Glucose, UA: NEGATIVE mg/dL
Ketones, ur: NEGATIVE mg/dL
Leukocytes,Ua: NEGATIVE
Nitrite: NEGATIVE
Protein, ur: NEGATIVE mg/dL
Specific Gravity, Urine: 1.01 (ref 1.005–1.030)
pH: 5.5 (ref 5.0–8.0)

## 2018-11-04 LAB — CBC
HCT: 40.1 % (ref 36.0–46.0)
Hemoglobin: 13.5 g/dL (ref 12.0–15.0)
MCH: 31.7 pg (ref 26.0–34.0)
MCHC: 33.7 g/dL (ref 30.0–36.0)
MCV: 94.1 fL (ref 80.0–100.0)
Platelets: 375 10*3/uL (ref 150–400)
RBC: 4.26 MIL/uL (ref 3.87–5.11)
RDW: 12.2 % (ref 11.5–15.5)
WBC: 13.4 10*3/uL — ABNORMAL HIGH (ref 4.0–10.5)
nRBC: 0 % (ref 0.0–0.2)

## 2018-11-04 LAB — COMPREHENSIVE METABOLIC PANEL
ALT: 11 U/L (ref 0–44)
AST: 13 U/L — ABNORMAL LOW (ref 15–41)
Albumin: 3.8 g/dL (ref 3.5–5.0)
Alkaline Phosphatase: 54 U/L (ref 38–126)
Anion gap: 8 (ref 5–15)
BUN: 9 mg/dL (ref 6–20)
CO2: 26 mmol/L (ref 22–32)
Calcium: 8.8 mg/dL — ABNORMAL LOW (ref 8.9–10.3)
Chloride: 105 mmol/L (ref 98–111)
Creatinine, Ser: 0.63 mg/dL (ref 0.44–1.00)
GFR calc Af Amer: >60 mL/min (ref >60)
GFR calc non Af Amer: >60 mL/min (ref >60)
Glucose, Bld: 106 mg/dL — ABNORMAL HIGH (ref 70–99)
Potassium: 3.7 mmol/L (ref 3.5–5.1)
Sodium: 139 mmol/L (ref 135–145)
Total Bilirubin: 0.4 mg/dL (ref 0.3–1.2)
Total Protein: 6.7 g/dL (ref 6.5–8.1)

## 2018-11-04 LAB — URINALYSIS, MICROSCOPIC (REFLEX)

## 2018-11-04 LAB — PREGNANCY, URINE: Preg Test, Ur: NEGATIVE

## 2018-11-04 LAB — LIPASE, BLOOD: Lipase: 37 U/L (ref 11–51)

## 2018-11-04 MED ORDER — HYDROCODONE-ACETAMINOPHEN 5-325 MG PO TABS
1.0000 | ORAL_TABLET | Freq: Once | ORAL | Status: AC
  Administered 2018-11-04: 1 via ORAL
  Filled 2018-11-04: qty 1

## 2018-11-04 MED ORDER — CYCLOBENZAPRINE HCL 10 MG PO TABS: 10.0000 mg | ORAL_TABLET | Freq: Two times a day (BID) | ORAL | 0 refills | Status: DC | PRN

## 2018-11-04 MED ORDER — DICLOFENAC SODIUM 1 % TD GEL: 4.0000 g | Freq: Four times a day (QID) | TRANSDERMAL | 0 refills | Status: DC

## 2018-11-04 NOTE — ED Triage Notes (Signed)
Pt reports RUQ pain since Thursday with nausea, heartburn and diarrhea

## 2018-11-04 NOTE — Discharge Instructions (Signed)
Today your lab tests for your kidney, liver and pancreas are all normal.  Ultrasound shows nothing wrong with your gallbladder.  This may be muscular in nature.  However if you develop fever, cough, shortness of breath, vomiting or other concerns please return.  We will initially treat with Tylenol, anti-inflammatory gel and muscle relaxer.  However if this does not work you need to follow-up with your regular doctor as they may need to do further testing or physical therapy.

## 2018-11-04 NOTE — ED Provider Notes (Signed)
[facility] Provider Note   CSN: [id number] Arrival date & time: 11/04/18  1938     History   Chief Complaint Chief Complaint  Patient presents with  . Abdominal Pain    HPI Emily Mclean is a 48 y.o. female.     Patient is a 48 year old female with a history of GERD, heart murmur and urticaria who presents today with upper abdominal pain that is been ongoing for the last 3 days.  She states that it started spontaneously and seemed to be a little better yesterday but has worsened today.  She often times will get reflux and states that usually gets better when she doubles her antacid but this pain is in the right upper quadrant and radiates into her shoulder blade.  It does not seem to be worse with deep breaths but certain movements seem to make it worse and lifting her arms over her head seem to relieve some of the discomfort.  Eating does not particularly make it worse and she states she has had episodes like this in the past but they have never been quite this severe.  She does see Dr. Havery Moros with GI and has had endoscopies that have been normal and gets ultrasounds of her gallbladder yearly to evaluate for any change in a known polyp but is otherwise had normal gallbladder.  She does not drink alcohol and does not take NSAIDs.  She did take Tylenol prior to coming which did improve the pain but now the Tylenol is wearing off and the pain is returning and is about an 8 out of 10.  She denies any history of unilateral leg pain or swelling.  She takes no estrogens and has no history of DVT or PE in herself or her family members.  She denies any cough, fever or shortness of breath.  No rashes present.  The history is provided by the patient.  Abdominal Pain Pain location:  RUQ Pain quality: aching and shooting   Pain radiates to:  R flank Pain severity:  Moderate Onset quality:  Gradual Duration:  3 days Timing:  Constant Progression:  Waxing and  waning Chronicity:  Recurrent Relieved by:  Acetaminophen Worsened by:  Movement Ineffective treatments:  Antacids Associated symptoms: no anorexia, no cough, no diarrhea, no fever, no nausea, no shortness of breath and no vomiting   Risk factors: has not had multiple surgeries and no NSAID use     Past Medical History:  Diagnosis Date  . Allergy   . Heart murmur   . Urticaria     Patient Active Problem List   Diagnosis Date Noted  . History of cold-induced urticaria 12/08/2015  . Preventative health care 04/22/2014  . Abdominal pain, chronic, epigastric 09/24/2009  . BMI 28.0-28.9,adult 07/17/2008    Past Surgical History:  Procedure Laterality Date  . NASAL SINUS SURGERY     polyps removed     OB History    Gravida  3   Para  3   Term  3   Preterm      AB      Living  3     SAB      TAB      Ectopic      Multiple      Live Births               Home Medications    Prior to Admission medications   Medication Sig Start Date End Date Taking?  Authorizing Provider  azelastine (OPTIVAR) 0.05 % ophthalmic solution Place 1 drop into both eyes as needed.  01/09/16   [provider]  Cholecalciferol (VITAMIN D) 2000 UNITS CAPS Take by mouth.    [provider]  EPINEPHrine 0.3 mg/0.3 mL IJ SOAJ injection  12/11/15   [provider]  fexofenadine (ALLEGRA) 180 MG tablet Take 180 mg by mouth daily.    [provider]  fluticasone (FLONASE) 50 MCG/ACT nasal spray Place 1 spray into both nostrils daily.  01/09/16   [provider]  montelukast (SINGULAIR) 10 MG tablet Take 10 mg by mouth at bedtime.  01/09/16   [provider]  pantoprazole (PROTONIX) 40 MG tablet TAKE 1 TABLET BY MOUTH EVERY DAY 07/18/17   Armbruster, Carlota Raspberry, MD  predniSONE (DELTASONE) 20 MG tablet 60 mg x3d, 40 mg x3d, 20 mg x2d, 10 mg x2d 10/03/17   Kuneff, Renee A, DO  sucralfate (CARAFATE) 1 g tablet Take 1 tablet (1 g total) by mouth 4  (four) times daily -  with meals and at bedtime. Patient not taking: Reported on 10/03/2017 12/11/15   Howard Pouch A, DO  valACYclovir (VALTREX) 1000 MG tablet Take 1 tablet (1,000 mg total) by mouth 3 (three) times daily. 10/03/17   Ma Hillock, DO    Family History Family History  Problem Relation Age of Onset  . Diabetes Mother   . Kidney disease Mother   . Depression Mother   . Allergic rhinitis Mother   . Diabetes Father   . Hypertension Father   . Heart disease Father   . Diabetes Brother   . Hypertension Brother   . Kidney disease Maternal Aunt   . Depression Maternal Grandfather        suicide  . Cancer Maternal Grandfather        lymphoma    Social History Social History   Tobacco Use  . Smoking status: Current Every Day Smoker    Packs/day: 0.50    Years: 19.00    Pack years: 9.50    Types: Cigarettes  . Smokeless tobacco: Never Used  Substance Use Topics  . Alcohol use: Yes    Comment: occasional  . Drug use: No     Allergies   Cefuroxime axetil and Cephalexin   Review of Systems Review of Systems  Constitutional: Negative for fever.  Respiratory: Negative for cough and shortness of breath.   Gastrointestinal: Positive for abdominal pain. Negative for anorexia, diarrhea, nausea and vomiting.  All other systems reviewed and are negative.    Physical Exam Updated Vital Signs BP (!) 143/70 (BP Location: Left Arm)   Pulse 70   Temp 98.6 F (37 C) (Oral)   Resp 18   Ht 5\' 5"  (1.651 m)   Wt 80.7 kg   SpO2 100%   BMI 29.62 kg/m   Physical Exam Vitals signs and nursing note reviewed.  Constitutional:      General: She is not in acute distress.    Appearance: She is well-developed and normal weight.  HENT:     Head: Normocephalic and atraumatic.  Eyes:     Conjunctiva/sclera: Conjunctivae normal.     Pupils: Pupils are equal, round, and reactive to light.  Neck:     Musculoskeletal: Normal range of motion and neck supple.   Cardiovascular:     Rate and Rhythm: Normal rate and regular rhythm.     Pulses: Normal pulses.     Heart sounds: No murmur.  Pulmonary:     Effort: Pulmonary effort is normal. No respiratory distress.     Breath sounds: Normal breath sounds. No wheezing or rales.  Abdominal:     General: There is no distension.     Palpations: Abdomen is soft.     Tenderness: There is abdominal tenderness in the right upper quadrant. There is no right CVA tenderness, left CVA tenderness, guarding or rebound. Negative signs include Murphy's sign.     Hernia: No hernia is present.  Musculoskeletal: Normal range of motion.        General: No tenderness.  Skin:    General: Skin is warm and dry.     Findings: No erythema or rash.  Neurological:     General: No focal deficit present.     Mental Status: She is alert and oriented to person, place, and time. Mental status is at baseline.  Psychiatric:        Mood and Affect: Mood normal.        Behavior: Behavior normal.      ED Treatments / Results  Labs (all labs ordered are listed, but only abnormal results are displayed) Labs Reviewed  COMPREHENSIVE METABOLIC PANEL - Abnormal; Notable for the following components:      Result Value   Glucose, Bld 106 (*)    Calcium 8.8 (*)    AST 13 (*)    All other components within normal limits  CBC - Abnormal; Notable for the following components:   WBC 13.4 (*)    All other components within normal limits  URINALYSIS, ROUTINE W REFLEX MICROSCOPIC - Abnormal; Notable for the following components:   Hgb urine dipstick LARGE (*)    All other components within normal limits  URINALYSIS, MICROSCOPIC (REFLEX) - Abnormal; Notable for the following components:   Bacteria, UA RARE (*)    All other components within normal limits  LIPASE, BLOOD  PREGNANCY, URINE    EKG None  Radiology US Abdomen Limited Ruq  Result Date: 11/04/2018 CLINICAL DATA:  Right upper quadrant abdominal pain. EXAM: ULTRASOUND  ABDOMEN LIMITED RIGHT UPPER QUADRANT COMPARISON:  Ultrasound dated 02/14/2018. FINDINGS: Gallbladder: No gallstones or wall thickening visualized. No sonographic Murphy sign noted by sonographer. Common bile duct: Diameter: 2 mm Liver: No focal lesion identified. Within normal limits in parenchymal echogenicity. Portal vein is patent on color Doppler imaging with normal direction of blood flow towards the liver. Other: None. IMPRESSION: Normal study. Electronically Signed   By: Constance Holster M.D.   On: 11/04/2018 21:20    Procedures Procedures (including critical care time)  Medications Ordered in ED Medications - No data to display   Initial Impression / Assessment and Plan / ED Course  I have reviewed the triage vital signs and the nursing notes.  Pertinent labs & imaging results that were available during my care of the patient were reviewed by me and considered in my medical decision making (see chart for details).        Patient presenting with right upper quadrant pain that radiates into her back that is been present now for 3 days waxing and waning but worsening.  Patient states she has had pain like this in the past and has seen GI but had a relatively negative work-up.  Low suspicion for GERD or PUD at this time.  Patient does take antacids and does not take medications that would increase her risk for ulcer.  She has not had any vomiting or diarrhea.  Labs show  normal LFTs and lipase.  She does have mild leukocytosis of 13,000 of unknown etiology andLarge amount of blood in urine but no urinary symptoms at this time.  Patient denies any COVID-like symptoms and is PERC negative and low suspicion for PE, dissection, cardiac cause or pneumonia.  May be musculoskeletal in nature.  Patient will continue Tylenol and was given muscle relaxer and diclofenac gel.  Encourage close follow-up with PCP and given strict return precautions.  Final Clinical Impressions(s) / ED Diagnoses   Final  diagnoses:  RUQ pain  RUQ abdominal pain    ED Discharge Orders         Ordered    cyclobenzaprine (FLEXERIL) 10 MG tablet  2 times daily PRN     11/04/18 2230    diclofenac sodium (VOLTAREN) 1 % GEL  4 times daily     11/04/18 2230           Blanchie Dessert, MD 11/04/18 2320

## 2018-11-04 NOTE — ED Notes (Signed)
Patient transported to Ultrasound 

## 2018-11-06 ENCOUNTER — Encounter: Payer: Self-pay | Admitting: Family Medicine

## 2018-11-06 ENCOUNTER — Telehealth: Payer: Self-pay | Admitting: Family Medicine

## 2018-11-06 NOTE — Telephone Encounter (Signed)
Hinton Dyer has called pt to schedule. Sooner visit.

## 2018-11-06 NOTE — Telephone Encounter (Signed)
Pt went to ED for RUQ pain, give Flexeril and Voltaren gel.  Please advise.

## 2018-11-06 NOTE — Telephone Encounter (Signed)
Called patient and scheduled her for Thursday. Patient would like something else in the meantime that she can do. She is in a lot of pain. If she can be seen sooner, she would be pleased.

## 2018-11-06 NOTE — Telephone Encounter (Signed)
I see that she got out of the hospital and depending on what time she wants an appointment, it will be limited. Let me know if you want me to call. These go to you right?

## 2018-11-06 NOTE — Telephone Encounter (Signed)
Arrange sooner follow up in slot canceled slot for tomorrow.

## 2018-11-06 NOTE — Telephone Encounter (Signed)
Patient left VM to schedule hospital follow up. First available appointment within scheduling guidelines is September 15th. Is it okay to wait until then to schedule her?

## 2018-11-07 ENCOUNTER — Encounter: Payer: Self-pay | Admitting: Family Medicine

## 2018-11-07 ENCOUNTER — Ambulatory Visit (HOSPITAL_BASED_OUTPATIENT_CLINIC_OR_DEPARTMENT_OTHER)
Admission: RE | Admit: 2018-11-07 | Discharge: 2018-11-07 | Disposition: A | Discharge status: Home / Self Care | Payer: BC Managed Care – PPO | Source: Ambulatory Visit | Attending: Family Medicine | Admitting: Family Medicine

## 2018-11-07 ENCOUNTER — Ambulatory Visit (INDEPENDENT_AMBULATORY_CARE_PROVIDER_SITE_OTHER): Payer: BC Managed Care – PPO | Admitting: Family Medicine

## 2018-11-07 ENCOUNTER — Other Ambulatory Visit: Payer: Self-pay

## 2018-11-07 VITALS — BP 110/69 | HR 77 | Temp 98.0°F | Resp 19 | Ht 64.0 in | Wt 178.0 lb

## 2018-11-07 DIAGNOSIS — R110 Nausea: Secondary | ICD-10-CM | POA: Diagnosis not present

## 2018-11-07 DIAGNOSIS — R1013 Epigastric pain: Secondary | ICD-10-CM | POA: Insufficient documentation

## 2018-11-07 DIAGNOSIS — R1011 Right upper quadrant pain: Secondary | ICD-10-CM | POA: Insufficient documentation

## 2018-11-07 MED ORDER — CYCLOBENZAPRINE HCL 10 MG PO TABS: 10.0000 mg | ORAL_TABLET | Freq: Three times a day (TID) | ORAL | 1 refills | Status: DC

## 2018-11-07 NOTE — Patient Instructions (Signed)
Hiatal Hernia  A hiatal hernia occurs when part of the stomach slides above the muscle that separates the abdomen from the chest (diaphragm). A person can be born with a hiatal hernia (congenital), or it may develop over time. In almost all cases of hiatal hernia, only the top part of the stomach pushes through the diaphragm. Many people have a hiatal hernia with no symptoms. The larger the hernia, the more likely it is that you will have symptoms. In some cases, a hiatal hernia allows stomach acid to flow back into the tube that carries food from your mouth to your stomach (esophagus). This may cause heartburn symptoms. Severe heartburn symptoms may mean that you have developed a condition called gastroesophageal reflux disease (GERD). What are the causes? This condition is caused by a weakness in the opening (hiatus) where the esophagus passes through the diaphragm to attach to the upper part of the stomach. A person may be born with a weakness in the hiatus, or a weakness can develop over time. What increases the risk? This condition is more likely to develop in:  Older people. Age is a major risk factor for a hiatal hernia, especially if you are over the age of 50.  Pregnant women.  People who are overweight.  People who have frequent constipation. What are the signs or symptoms? Symptoms of this condition usually develop in the form of GERD symptoms. Symptoms include:  Heartburn.  Belching.  Indigestion.  Trouble swallowing.  Coughing or wheezing.  Sore throat.  Hoarseness.  Chest pain.  Nausea and vomiting. How is this diagnosed? This condition may be diagnosed during testing for GERD. Tests that may be done include:  X-rays of your stomach or chest.  An upper gastrointestinal (GI) series. This is an X-ray exam of your GI tract that is taken after you swallow a chalky liquid that shows up clearly on the X-ray.  Endoscopy. This is a procedure to look into your stomach  using a thin, flexible tube that has a tiny camera and light on the end of it. How is this treated? This condition may be treated by:  Dietary and lifestyle changes to help reduce GERD symptoms.  Medicines. These may include: ? Over-the-counter antacids. ? Medicines that make your stomach empty more quickly. ? Medicines that block the production of stomach acid (H2 blockers). ? Stronger medicines to reduce stomach acid (proton pump inhibitors).  Surgery to repair the hernia, if other treatments are not helping. If you have no symptoms, you may not need treatment. Follow these instructions at home: Lifestyle and activity  Do not use any products that contain nicotine or tobacco, such as cigarettes and e-cigarettes. If you need help quitting, ask your health care provider.  Try to achieve and maintain a healthy body weight.  Avoid putting pressure on your abdomen. Anything that puts pressure on your abdomen increases the amount of acid that may be pushed up into your esophagus. ? Avoid bending over, especially after eating. ? Raise the head of your bed by putting blocks under the legs. This keeps your head and esophagus higher than your stomach. ? Do not wear tight clothing around your chest or stomach. ? Try not to strain when having a bowel movement, when urinating, or when lifting heavy objects. Eating and drinking  Avoid foods that can worsen GERD symptoms. These may include: ? Fatty foods, like fried foods. ? Citrus fruits, like oranges or lemon. ? Other foods and drinks that contain acid, like   orange juice or tomatoes. ? Spicy food. ? Chocolate.  Eat frequent small meals instead of three large meals a day. This helps prevent your stomach from getting too full. ? Eat slowly. ? Do not lie down right after eating. ? Do not eat 1-2 hours before bed.  Do not drink beverages with caffeine. These include cola, coffee, cocoa, and tea.  Do not drink alcohol. General instructions   Take over-the-counter and prescription medicines only as told by your health care provider.  Keep all follow-up visits as told by your health care provider. This is important. Contact a health care provider if:  Your symptoms are not controlled with medicines or lifestyle changes.  You are having trouble swallowing.  You have coughing or wheezing that will not go away. Get help right away if:  Your pain is getting worse.  Your pain spreads to your arms, neck, jaw, teeth, or back.  You have shortness of breath.  You sweat for no reason.  You feel sick to your stomach (nauseous) or you vomit.  You vomit blood.  You have bright red blood in your stools.  You have black, tarry stools. This information is not intended to replace advice given to you by your health care provider. Make sure you discuss any questions you have with your health care provider. Document Released: 05/15/2003 Document Revised: 02/04/2017 Document Reviewed: 09/27/2016 [employer] Patient Education  2020 [employer]  

## 2018-11-07 NOTE — Progress Notes (Signed)
[place] , 1970/07/20, 48 y.o., female MRN: [id number] Patient Care Team    Relationship Specialty Notifications Start End  Ma Hillock, DO PCP - General Family Medicine  12/10/15     Chief Complaint  Patient presents with  . Follow-up    Pt went to ED with RUQ pain. The pain is constant and worse with movement. No trauma.      Subjective: Pt presents for an OV with complaints of RUQ pain onset 8/26- seen in ED 3 days later.  Pain is now more constant and worse with movement. Associated symptoms include mild nausea and feeling like her food is getting stuck (points to epigastric). Fluids are tolerated. She states now after she eats she can radiation of pain around towards her right thoracic. She denies vomit. RUQ [place] did not show evidence of gallbladder cause- no stones and BC normal caliber.  Of note she did have a mild elevation in WBCs at 13.4.  She does endorse increase in her reflux symptoms. She denies fever, chills or bowel changes.   No LMP recorded. (Menstrual status: IUD). She did have menstrual bleeding the day of her UA in the ED- which resulted with large blood.   Pt has tried flexeril to ease their symptoms. She states the flexeril works well, until it wears off- she is taking every 12 hours. She denies sedation with use.    EGD without mention of hiatal hernia 02/2017.  She is established with gastroenterology for her reflux.  She reports compliance with Carafate, Protonix 40 mg daily.  She denies NSAID use.  She states passing gas does help relieve some of the pressure.  Labs 11/04/2018: CMP: low calcium CBC elevated WBC 13.4 Lipase normal Neg preg Large blood in urine [place] RUQ: normal- no gallstones.  Gallbladder: No gallstones or wall thickening visualized. No sonographic Murphy sign noted by sonographer. Common bile duct: Diameter: 2 mm Liver: No focal lesion identified. Within normal limits in parenchymal echogenicity. Portal vein is patent on  color Doppler imaging with normal direction of blood flow towards the liver.   No flowsheet data found.  Allergies  Allergen Reactions  . Cefuroxime Axetil     REACTION: red rash  . Cephalexin     REACTION: red rash   Social History   Social History Narrative   Live w/husband "Community education officer" & 3 kids [name], [place], [place]).    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    Smokes daily. Rare use of alcohol, no drugs.    Drinks some caffeine, uses herbal remedies.   Wears her seatbelt, bicycle helmet, smoke detector in the home.    Exercises routinely.    Feels safe in her relationships.    Past Medical History:  Diagnosis Date  . Allergy   . Heart murmur   . Urticaria    Past Surgical History:  Procedure Laterality Date  . NASAL SINUS SURGERY     polyps removed   Family History  Problem Relation Age of Onset  . Diabetes Mother   . Kidney disease Mother   . Depression Mother   . Allergic rhinitis Mother   . Diabetes Father   . Hypertension Father   . Heart disease Father   . Diabetes Brother   . Hypertension Brother   . Kidney disease Maternal Aunt   . Depression Maternal Grandfather        suicide  . Cancer Maternal Grandfather        lymphoma  Allergies as of 11/07/2018      Reactions   Cefuroxime Axetil    REACTION: red rash   Cephalexin    REACTION: red rash      Medication List       Accurate as of November 07, 2018 10:10 AM. If you have any questions, ask your nurse or doctor.        azelastine 0.05 % ophthalmic solution Commonly known as: OPTIVAR Place 1 drop into both eyes as needed.   cyclobenzaprine 10 MG tablet Commonly known as: FLEXERIL Take 1 tablet (10 mg total) by mouth 2 (two) times daily as needed for muscle spasms.   diclofenac sodium 1 % Gel Commonly known as: VOLTAREN Apply 4 g topically 4 (four) times daily.   EPINEPHrine 0.3 mg/0.3 mL Soaj injection Commonly known as: EPI-PEN   fexofenadine 180 MG tablet Commonly known  as: ALLEGRA Take 180 mg by mouth daily.   fluticasone 50 MCG/ACT nasal spray Commonly known as: FLONASE Place 1 spray into both nostrils daily.   montelukast 10 MG tablet Commonly known as: SINGULAIR Take 10 mg by mouth at bedtime.   pantoprazole 40 MG tablet Commonly known as: PROTONIX TAKE 1 TABLET BY MOUTH EVERY DAY   predniSONE 20 MG tablet Commonly known as: DELTASONE 60 mg x3d, 40 mg x3d, 20 mg x2d, 10 mg x2d   sucralfate 1 g tablet Commonly known as: Carafate Take 1 tablet (1 g total) by mouth 4 (four) times daily -  with meals and at bedtime.   valACYclovir 1000 MG tablet Commonly known as: VALTREX Take 1 tablet (1,000 mg total) by mouth 3 (three) times daily.   Vitamin D 50 MCG (2000 UT) Caps Take by mouth.       All past medical history, surgical history, allergies, family history, immunizations andmedications were updated in the EMR today and reviewed under the history and medication portions of their EMR.     ROS: Negative, with the exception of above mentioned in HPI   Objective:  BP 110/69 (BP Location: Right Arm, Patient Position: Sitting, Cuff Size: Normal)   Pulse 77   Temp 98 F (36.7 C) (Temporal)   Resp 19   Ht 5\' 4"  (1.626 m)   Wt 178 lb (80.7 kg)   SpO2 99%   BMI 30.55 kg/m  Body mass index is 30.55 kg/m. Gen: Afebrile. No acute distress. Nontoxic in appearance, well developed, well nourished.  HENT: AT. [place]. MMM Eyes:Pupils Equal Round Reactive to light, Extraocular movements intact,  Conjunctiva without redness, discharge or icterus. CV: RRR no murmur, noedema Chest: CTAB, no wheeze or crackles. Good air movement, normal resp effort.  Abd: Soft. flat. Moderate to severe TTP epigastric and RUQ. ND. BS present. no Masses palpated. No rebound or guarding.  Skin: no rashes, purpura or petechiae.  Neuro:  Normal gait. PERLA. EOMi. Alert. Oriented x3  Psych: Normal affect, dress and demeanor. Normal speech. Normal thought content and  judgment.  Recent Results (from the past 2160 hour(s))  Urinalysis, Routine w reflex microscopic     Status: Abnormal   Collection Time: 11/04/18  8:11 PM  Result Value Ref Range   Color, Urine YELLOW YELLOW   APPearance CLEAR CLEAR   Specific Gravity, Urine 1.010 1.005 - 1.030   pH 5.5 5.0 - 8.0   Glucose, UA NEGATIVE NEGATIVE mg/dL   Hgb urine dipstick LARGE (A) NEGATIVE   Bilirubin Urine NEGATIVE NEGATIVE   Ketones, ur NEGATIVE NEGATIVE mg/dL   Protein,  ur NEGATIVE NEGATIVE mg/dL   Nitrite NEGATIVE NEGATIVE   Leukocytes,Ua NEGATIVE NEGATIVE    Comment: Performed at [facility], [address] [place], [place] [address]  Pregnancy, urine     Status: None   Collection Time: 11/04/18  8:11 PM  Result Value Ref Range   Preg Test, Ur NEGATIVE NEGATIVE    Comment:        THE SENSITIVITY OF THIS METHODOLOGY IS >20 mIU/mL. Performed at [facility], [address] [place], [place] [address]   Urinalysis, Microscopic (reflex)     Status: Abnormal   Collection Time: 11/04/18  8:11 PM  Result Value Ref Range   RBC / HPF 6-10 0 - 5 RBC/hpf   WBC, UA 0-5 0 - 5 WBC/hpf   Bacteria, UA RARE (A) NONE SEEN   Squamous Epithelial / LPF 0-5 0 - 5    Comment: Performed at [facility], [address] [place], [place] [address]  Lipase, blood     Status: None   Collection Time: 11/04/18  9:01 PM  Result Value Ref Range   Lipase 37 11 - 51 U/L    Comment: Performed at [facility], [address] [place], [place] [address]  Comprehensive metabolic panel     Status: Abnormal   Collection Time: 11/04/18  9:01 PM  Result Value Ref Range   Sodium 139 135 - 145 mmol/L   Potassium 3.7 3.5 - 5.1 mmol/L   Chloride 105 98 - 111 mmol/L   CO2 26 22 - 32 mmol/L   Glucose, Bld 106 (H) 70 - 99 mg/dL   BUN 9 6 - 20 mg/dL   Creatinine, Ser 0.63 0.44 - 1.00 mg/dL   Calcium 8.8 (L) 8.9 - 10.3 mg/dL   Total Protein 6.7 6.5 - 8.1 g/dL   Albumin 3.8 3.5 -  5.0 g/dL   AST 13 (L) 15 - 41 U/L   ALT 11 0 - 44 U/L   Alkaline Phosphatase 54 38 - 126 U/L   Total Bilirubin 0.4 0.3 - 1.2 mg/dL   GFR calc non Af Amer >60 >60 mL/min   GFR calc Af Amer >60 >60 mL/min   Anion gap 8 5 - 15    Comment: Performed at [facility], [address] [place], [place] [address]  CBC     Status: Abnormal   Collection Time: 11/04/18  9:01 PM  Result Value Ref Range   WBC 13.4 (H) 4.0 - 10.5 K/uL   RBC 4.26 3.87 - 5.11 MIL/uL   Hemoglobin 13.5 12.0 - 15.0 g/dL   HCT 40.1 36.0 - 46.0 %   MCV 94.1 80.0 - 100.0 fL   MCH 31.7 26.0 - 34.0 pg   MCHC 33.7 30.0 - 36.0 g/dL   RDW 12.2 11.5 - 15.5 %   Platelets 375 150 - 400 K/uL   nRBC 0.0 0.0 - 0.2 %    Comment: Performed at [facility], [address] [place], [place] [address]   Dg Chest 2 View  Result Date: 11/07/2018 CLINICAL DATA:  Epigastric pain EXAM: CHEST - 2 VIEW COMPARISON:  01/01/2013 FINDINGS: The heart size and mediastinal contours are within normal limits. Both lungs are clear. Minimal degenerative changes of the spine. IMPRESSION: No active cardiopulmonary disease. Electronically Signed   By: Donavan Foil M.D.   On: 11/07/2018 15:26   US Abdomen Limited Ruq  Result Date: 11/04/2018 CLINICAL DATA:  Right upper quadrant abdominal pain. EXAM: ULTRASOUND ABDOMEN LIMITED RIGHT UPPER QUADRANT COMPARISON:  Ultrasound dated 02/14/2018. FINDINGS: Gallbladder: No gallstones or wall thickening visualized. No sonographic Murphy sign noted by sonographer. Common bile duct: Diameter: 2 mm Liver: No focal lesion identified. Within normal limits in parenchymal echogenicity. Portal vein is patent on color Doppler imaging with normal direction of blood flow towards the liver. Other: None. IMPRESSION: Normal study. Electronically Signed   By: Constance Holster M.D.   On: 11/04/2018 21:20    Assessment/Plan: Emily Mclean is a 48 y.o. female present for OV for  Epigastric pain/RUQ  pain/Nausea -Patient signs and symptoms could be consistent with hiatal hernia, gastritis/colitis or even cholecystitis.   -Flexeril is giving her some comfort, which seems to point towards diaphragmatic strain such as a sliding hiatal hernia however cannot rule out gastritis or duodenitis/colitis without  imaging.  She is rather exquisitely tender over right epigastric and superior epigastric regions on exam today. - [place] normal. CXR ordered today>> normal. Will need to proceed with CT and/or referral. - cyclobenzaprine (FLEXERIL) 10 MG tablet; Take 1 tablet (10 mg total) by mouth 3 (three) times daily.  Dispense: 90 tablet; Refill: 1 - DG Chest 2 View; Future - zofran prescribed for nausea.  -Follow-up depending upon imaging results.  If unable to obtain image would refer her back to her gastroenterologist for further evaluation.   Reviewed expectations re: course of current medical issues.  Discussed self-management of symptoms.  Outlined signs and symptoms indicating need for more acute intervention.  Patient verbalized understanding and all questions were answered.  Patient received an After-Visit Summary.    No orders of the defined types were placed in this encounter.   > 25 minutes spent with patient, >50% of time spent face to face    Note is dictated utilizing voice recognition software. Although note has been proof read prior to signing, occasional typographical errors still can be missed. If any questions arise, please do not hesitate to call for verification.   electronically signed by:  Howard Pouch, DO  [place]

## 2018-11-07 NOTE — Telephone Encounter (Signed)
Patient scheduled for ER f/u appointment.

## 2018-11-08 ENCOUNTER — Telehealth: Payer: Self-pay | Admitting: Family Medicine

## 2018-11-08 DIAGNOSIS — R1011 Right upper quadrant pain: Secondary | ICD-10-CM

## 2018-11-08 DIAGNOSIS — R1084 Generalized abdominal pain: Secondary | ICD-10-CM

## 2018-11-08 DIAGNOSIS — R112 Nausea with vomiting, unspecified: Secondary | ICD-10-CM

## 2018-11-08 DIAGNOSIS — R1013 Epigastric pain: Secondary | ICD-10-CM

## 2018-11-08 DIAGNOSIS — R1110 Vomiting, unspecified: Secondary | ICD-10-CM | POA: Insufficient documentation

## 2018-11-08 HISTORY — DX: Generalized abdominal pain: R10.84

## 2018-11-08 MED ORDER — ONDANSETRON HCL 4 MG PO TABS: 4.0000 mg | ORAL_TABLET | Freq: Three times a day (TID) | ORAL | 0 refills | Status: DC | PRN

## 2018-11-08 NOTE — Telephone Encounter (Signed)
Pt returned call and was given results. Pt verbalized understanding.

## 2018-11-08 NOTE — Telephone Encounter (Signed)
Please inform patient the following information: Her cxr is normal. I have ordered a CT of her abd. They will call her to schedule. I also called in zofran to help with nausea.

## 2018-11-08 NOTE — Telephone Encounter (Signed)
Pt was called and message was left to return call  

## 2018-11-09 ENCOUNTER — Ambulatory Visit: Payer: BC Managed Care – PPO | Admitting: Family Medicine

## 2018-11-10 ENCOUNTER — Other Ambulatory Visit: Payer: Self-pay

## 2018-11-10 ENCOUNTER — Ambulatory Visit
Admission: RE | Admit: 2018-11-10 | Discharge: 2018-11-10 | Disposition: A | Discharge status: Home / Self Care | Payer: BC Managed Care – PPO | Source: Ambulatory Visit | Attending: Family Medicine | Admitting: Family Medicine

## 2018-11-10 DIAGNOSIS — R1013 Epigastric pain: Secondary | ICD-10-CM

## 2018-11-10 DIAGNOSIS — R1011 Right upper quadrant pain: Secondary | ICD-10-CM

## 2018-11-10 DIAGNOSIS — N281 Cyst of kidney, acquired: Secondary | ICD-10-CM | POA: Diagnosis not present

## 2018-11-10 DIAGNOSIS — R112 Nausea with vomiting, unspecified: Secondary | ICD-10-CM

## 2018-11-10 MED ORDER — IOPAMIDOL (ISOVUE-300) INJECTION 61%
100.0000 mL | Freq: Once | INTRAVENOUS | Status: AC | PRN
  Administered 2018-11-10: 100 mL via INTRAVENOUS

## 2018-11-10 NOTE — Progress Notes (Signed)
Spoke with Maudie Mercury at [facility] office today.  Patient order states "...Colitis suspected" advised her that a CT abd will not fully visualize the colon, and asked if pelvic CT needed to be done.  She states that Dr. Raoul Pitch is unavailable for the next several days and there is no one in the office who can review and change the order.  Please proceed as ordered by Dr. Raoul Pitch

## 2018-11-15 ENCOUNTER — Telehealth: Payer: Self-pay | Admitting: Family Medicine

## 2018-11-15 DIAGNOSIS — G8929 Other chronic pain: Secondary | ICD-10-CM

## 2018-11-15 DIAGNOSIS — R112 Nausea with vomiting, unspecified: Secondary | ICD-10-CM

## 2018-11-15 NOTE — Telephone Encounter (Signed)
lmom for pt to CB to discuss.

## 2018-11-15 NOTE — Telephone Encounter (Signed)
Pt returned call and was given results/instructions, she verbalized understanding

## 2018-11-15 NOTE — Telephone Encounter (Signed)
Please inform patient the following information: Her abd CT is normal. No cause identified for abd pain.  I have placed a referral back to her GI specialist to further evaluate her pain and consider EGD if appropriate

## 2018-11-23 DIAGNOSIS — Z6830 Body mass index (BMI) 30.0-30.9, adult: Secondary | ICD-10-CM | POA: Diagnosis not present

## 2018-11-23 DIAGNOSIS — Z1231 Encounter for screening mammogram for malignant neoplasm of breast: Secondary | ICD-10-CM | POA: Diagnosis not present

## 2018-11-23 DIAGNOSIS — Z01419 Encounter for gynecological examination (general) (routine) without abnormal findings: Secondary | ICD-10-CM | POA: Diagnosis not present

## 2018-11-24 LAB — HM PAP SMEAR: HM Pap smear: NOT DETECTED

## 2018-11-29 ENCOUNTER — Other Ambulatory Visit: Payer: Self-pay

## 2018-11-29 ENCOUNTER — Ambulatory Visit: Payer: BC Managed Care – PPO | Admitting: Nurse Practitioner

## 2018-11-29 ENCOUNTER — Encounter: Payer: Self-pay | Admitting: Nurse Practitioner

## 2018-11-29 VITALS — BP 108/66 | HR 72 | Temp 97.4°F | Ht 65.0 in | Wt 178.0 lb

## 2018-11-29 DIAGNOSIS — R1011 Right upper quadrant pain: Secondary | ICD-10-CM

## 2018-11-29 NOTE — Progress Notes (Signed)
Agree with assessment and plan. If symptoms persist moving forward, could consider colonoscopy if reliably related to passing gas or bowel habits.

## 2018-11-29 NOTE — Progress Notes (Signed)
Chief Complaint:  Abdominal pain  IMPRESSION and PLAN:    48 year old female with recent episode of right mid back pain / RUQ prompting ED visit. Abdominal imaging / labs unremarkable. Pain relieved after passing large amount of flatus. Interestingly patient has had similar episodes in past and they all seem to follow some sort of muscle strain in her back.  -Pain could be musculoskeletal but doesn't explain immediate relief from flatus. Etiology isn't clear but workup negative, she is clearly better. Can try Gas-x the next time this occurs.        HPI:     Patient is a 48 yo female known to Dr. Havery Moros. He evaluated her for abdominal pain 2018. RUQ [place] negative except for small gallbladder polyp not felt to need follow up. EGD remarkable for gastritis and a benign duodenal polyp.   Ginger was seen in the ED 11/04/2018.  She had developed RUQ pain 2 days prior.  The pain actually started in her right mid back radiating later around to the RUQ.  No associated nausea or vomiting.  No change in bowel movements. The pain was spasm-like.  Ibuprofen was somewhat helpful.  In the ED her CT scan, RUQ ultrasound and labs were all unremarkable.  Was prescribed a muscle relaxer which may have helped.  The pain resolved after she expelled a massive amount of flatus. Ginger has had similar episodes of this type pain before. She has noticed that episodes have generally occurred after pulling / straining a back muscle though she cannot explain the component of intestinal gas.   Review of systems:     No chest pain, no SOB, no fevers, no urinary sx   Past Medical History:  Diagnosis Date  . Allergy   . Heart murmur   . Urticaria     Patient's surgical history, family medical history, social history, medications and allergies were all reviewed in Epic   Current Outpatient Medications  Medication Sig Dispense Refill  . azelastine (OPTIVAR) 0.05 % ophthalmic solution Place 1 drop into both eyes  as needed.     . Cholecalciferol (VITAMIN D) 2000 UNITS CAPS Take by mouth.    . cyclobenzaprine (FLEXERIL) 10 MG tablet Take 1 tablet (10 mg total) by mouth 3 (three) times daily. 90 tablet 1  . EPINEPHrine 0.3 mg/0.3 mL IJ SOAJ injection     . fexofenadine (ALLEGRA) 180 MG tablet Take 180 mg by mouth daily.    . fluticasone (FLONASE) 50 MCG/ACT nasal spray Place 1 spray into both nostrils daily.     . montelukast (SINGULAIR) 10 MG tablet Take 10 mg by mouth at bedtime.     . pantoprazole (PROTONIX) 40 MG tablet TAKE 1 TABLET BY MOUTH EVERY DAY 90 tablet 2   No current facility-administered medications for this visit.     Physical Exam:     BP 108/66   Pulse 72   Temp (!) 97.4 F (36.3 C) (Temporal)   Ht 5\' 5"  (1.651 m)   Wt 178 lb (80.7 kg)   BMI 29.62 kg/m   GENERAL:  Pleasant female in NAD PSYCH: : Cooperative, normal affect EENT:  conjunctiva pink, mucous membranes moist, neck supple without masses CARDIAC:  RRR,  no peripheral edema PULM: Normal respiratory effort, lungs CTA bilaterally, no wheezing ABDOMEN:  Nondistended, soft, nontender. No obvious masses, no hepatomegaly,  normal bowel sounds SKIN:  turgor, no lesions seen Musculoskeletal:  Normal muscle tone, normal strength NEURO: Alert and  oriented x 3, no focal neurologic deficits   Tye Savoy , NP 11/29/2018, 1:16 PM

## 2018-11-29 NOTE — Patient Instructions (Signed)
If you are age 46 or older, your body mass index should be between 23-30. Your Body mass index is 29.62 kg/m. If this is out of the aforementioned range listed, please consider follow up with your Primary Care Provider.  If you are age 38 or younger, your body mass index should be between 19-25. Your Body mass index is 29.62 kg/m. If this is out of the aformentioned range listed, please consider follow up with your Primary Care Provider.   Start Gas X as needed.  (this is over-the-counter).  Follow up as needed.  Thank you for choosing me and [facility].   Tye Savoy, NP

## 2019-01-04 DIAGNOSIS — E039 Hypothyroidism, unspecified: Secondary | ICD-10-CM | POA: Diagnosis not present

## 2019-01-04 DIAGNOSIS — N951 Menopausal and female climacteric states: Secondary | ICD-10-CM | POA: Diagnosis not present

## 2019-01-05 DIAGNOSIS — Z23 Encounter for immunization: Secondary | ICD-10-CM | POA: Diagnosis not present

## 2019-01-09 DIAGNOSIS — R5383 Other fatigue: Secondary | ICD-10-CM | POA: Diagnosis not present

## 2019-01-09 DIAGNOSIS — R6882 Decreased libido: Secondary | ICD-10-CM | POA: Diagnosis not present

## 2019-01-09 DIAGNOSIS — N951 Menopausal and female climacteric states: Secondary | ICD-10-CM | POA: Diagnosis not present

## 2019-01-09 DIAGNOSIS — G479 Sleep disorder, unspecified: Secondary | ICD-10-CM | POA: Diagnosis not present

## 2019-01-22 DIAGNOSIS — H1045 Other chronic allergic conjunctivitis: Secondary | ICD-10-CM | POA: Diagnosis not present

## 2019-01-22 DIAGNOSIS — J3089 Other allergic rhinitis: Secondary | ICD-10-CM | POA: Diagnosis not present

## 2019-01-22 DIAGNOSIS — R05 Cough: Secondary | ICD-10-CM | POA: Diagnosis not present

## 2019-01-22 DIAGNOSIS — J301 Allergic rhinitis due to pollen: Secondary | ICD-10-CM | POA: Diagnosis not present

## 2019-02-06 DIAGNOSIS — E039 Hypothyroidism, unspecified: Secondary | ICD-10-CM | POA: Diagnosis not present

## 2019-02-06 DIAGNOSIS — N951 Menopausal and female climacteric states: Secondary | ICD-10-CM | POA: Diagnosis not present

## 2019-02-08 DIAGNOSIS — N951 Menopausal and female climacteric states: Secondary | ICD-10-CM | POA: Diagnosis not present

## 2019-02-08 DIAGNOSIS — R5383 Other fatigue: Secondary | ICD-10-CM | POA: Diagnosis not present

## 2019-02-08 DIAGNOSIS — R6882 Decreased libido: Secondary | ICD-10-CM | POA: Diagnosis not present

## 2019-02-08 DIAGNOSIS — E039 Hypothyroidism, unspecified: Secondary | ICD-10-CM | POA: Diagnosis not present

## 2019-03-27 ENCOUNTER — Encounter: Payer: Self-pay | Admitting: Sports Medicine

## 2019-03-27 ENCOUNTER — Other Ambulatory Visit: Payer: Self-pay

## 2019-03-27 ENCOUNTER — Ambulatory Visit: Payer: BC Managed Care – PPO | Admitting: Sports Medicine

## 2019-03-27 ENCOUNTER — Ambulatory Visit (INDEPENDENT_AMBULATORY_CARE_PROVIDER_SITE_OTHER): Payer: BC Managed Care – PPO

## 2019-03-27 VITALS — Temp 97.6°F

## 2019-03-27 DIAGNOSIS — M2012 Hallux valgus (acquired), left foot: Secondary | ICD-10-CM

## 2019-03-27 DIAGNOSIS — M79675 Pain in left toe(s): Secondary | ICD-10-CM

## 2019-03-27 DIAGNOSIS — L603 Nail dystrophy: Secondary | ICD-10-CM

## 2019-03-27 DIAGNOSIS — B351 Tinea unguium: Secondary | ICD-10-CM | POA: Diagnosis not present

## 2019-03-27 NOTE — Progress Notes (Signed)
Subjective: Emily Mclean is a 49 y.o. female patient who presents to office for evaluation of Left bunion pain and thick discolor toenail at 1st toe on left. Patient complains of progressive pain especially over the last 6 months in the Left foot that starts as pain over the bump with direct pressure and range of motion; patient now has difficulty fitting shoes comfortably and walking for long periods. Ranks pain 5-7/10 and is now interferring with daily activities.  Patient also reports that she wants her left 1st toenail removed has tried all fungal options laser, topical, meds without improvement. Patient denies any other pedal complaints.   Patient Active Problem List   Diagnosis Date Noted  . Generalized abdominal pain 11/08/2018  . Non-intractable vomiting 11/08/2018  . History of cold-induced urticaria 12/08/2015  . Preventative health care 04/22/2014  . Abdominal pain, chronic, epigastric 09/24/2009  . BMI 28.0-28.9,adult 07/17/2008    Current Outpatient Medications on File Prior to Visit  Medication Sig Dispense Refill  . azelastine (OPTIVAR) 0.05 % ophthalmic solution Place 1 drop into both eyes as needed.     . Cholecalciferol (VITAMIN D) 2000 UNITS CAPS Take by mouth.    . cyclobenzaprine (FLEXERIL) 10 MG tablet Take 1 tablet (10 mg total) by mouth 3 (three) times daily. 90 tablet 1  . EPINEPHrine 0.3 mg/0.3 mL IJ SOAJ injection     . fexofenadine (ALLEGRA) 180 MG tablet Take 180 mg by mouth daily.    . fluticasone (FLONASE) 50 MCG/ACT nasal spray Place 1 spray into both nostrils daily.     . montelukast (SINGULAIR) 10 MG tablet Take 10 mg by mouth at bedtime.     . pantoprazole (PROTONIX) 40 MG tablet TAKE 1 TABLET BY MOUTH EVERY DAY 90 tablet 2   No current facility-administered medications on file prior to visit.    Allergies  Allergen Reactions  . Cefuroxime Axetil     REACTION: red rash  . Cephalexin     REACTION: red rash  . Sulfamethoxazole-Trimethoprim      Objective:  General: Alert and oriented x3 in no acute distress  Dermatology: No open lesions bilateral lower extremities, no webspace macerations, no ecchymosis bilateral, all nails x 10 are well manicured with severe thickening and discoloration at left 1st toenail.  Vascular: Dorsalis Pedis and Posterior Tibial pedal pulses 2/4, Capillary Fill Time 3 seconds, (+) pedal hair growth bilateral, no edema bilateral lower extremities, Temperature gradient within normal limits.  Neurology: Gross sensation intact via light touch bilateral.   Musculoskeletal: Mild tenderness with palpation left bunion deformity, no limitation or crepitus with range of motion, deformity reducible, tracking not trackbound, there is no 1st ray hypermobility noted bilateral. Midtarsal, Subtalar joint, and ankle joint range of motion is within normal limits. On weightbearing exam, there is decreased 1st MTPJ rom Left with functional limitus noted, there is medial arch collapse on weightbearing, rearfoot slight valgus, forefoot slight abduction with HAV deformity supported on ground with no second toe crossover deformity noted.   Xrays  Left Foot    Impression: Intermetatarsal angle above normal limits with hallux deviation and 5th met deviation.      Assessment and Plan: Problem List Items Addressed This Visit    None    Visit Diagnoses    Hallux valgus of left foot    -  Primary   Relevant Orders   DG Foot Complete Left   Toe pain, left       Nail fungus  Nail dystrophy           -Complete examination performed -Xrays reviewed -Discussed treatement options; discussed HAV deformity;conservative and  Surgical management; risks, benefits, alternatives discussed. All patient's questions answered. -Patient at this time wants to think about surgery for her bunion and temp nail avulsion procedure; Thinking about having procedure in September  Meanwhile dispensed bilateral bunion sleeves for patient to use  as instructed -Recommend continue with good supportive shoes and inserts and self nail care.  -Patient to return to office when ready for surgery or sooner if condition worsens.  Landis Martins, DPM

## 2019-03-27 NOTE — Patient Instructions (Signed)
Bunion  A bunion is a bump on the base of the big toe that forms when the bones of the big toe joint move out of position. Bunions may be small at first, but they often get larger over time. They can make walking painful. What are the causes? A bunion may be caused by:  Wearing narrow or pointed shoes that force the big toe to press against the other toes.  Abnormal foot development that causes the foot to roll inward (pronate).  Changes in the foot that are caused by certain diseases, such as rheumatoid arthritis or polio.  A foot injury. What increases the risk? The following factors may make you more likely to develop this condition:  Wearing shoes that squeeze the toes together.  Having certain diseases, such as: ? Rheumatoid arthritis. ? Polio. ? Cerebral palsy.  Having family members who have bunions.  Being born with a foot deformity, such as flat feet or low arches.  Doing activities that put a lot of pressure on the feet, such as ballet dancing. What are the signs or symptoms? The main symptom of a bunion is a noticeable bump on the big toe. Other symptoms may include:  Pain.  Swelling around the big toe.  Redness and inflammation.  Thick or hardened skin on the big toe or between the toes.  Stiffness or loss of motion in the big toe.  Trouble with walking. How is this diagnosed? A bunion may be diagnosed based on your symptoms, medical history, and activities. You may have tests, such as:  X-rays. These allow your health care provider to check the position of the bones in your foot and look for damage to your joint. They also help your health care provider determine the severity of your bunion and the best way to treat it.  Joint aspiration. In this test, a sample of fluid is removed from the toe joint. This test may be done if you are in a lot of pain. It helps rule out diseases that cause painful swelling of the joints, such as arthritis. How is this  treated? Treatment depends on the severity of your symptoms. The goal of treatment is to relieve symptoms and prevent the bunion from getting worse. Your health care provider may recommend:  Wearing shoes that have a wide toe box.  Using bunion pads to cushion the affected area.  Taping your toes together to keep them in a normal position.  Placing a device inside your shoe (orthotics) to help reduce pressure on your toe joint.  Taking medicine to ease pain, inflammation, and swelling.  Applying heat or ice to the affected area.  Doing stretching exercises.  Surgery to remove scar tissue and move the toes back into their normal position. This treatment is rare. Follow these instructions at home: Managing pain, stiffness, and swelling   If directed, put ice on the painful area: ? Put ice in a plastic bag. ? Place a towel between your skin and the bag. ? Leave the ice on for 20 minutes, 2-3 times a day. Activity   If directed, apply heat to the affected area before you exercise. Use the heat source that your health care provider recommends, such as a moist heat pack or a heating pad. ? Place a towel between your skin and the heat source. ? Leave the heat on for 20-30 minutes. ? Remove the heat if your skin turns bright red. This is especially important if you are unable to feel pain,   heat, or cold. You may have a greater risk of getting burned.  Do exercises as told by your health care provider. General instructions  Support your toe joint with proper footwear, shoe padding, or taping as told by your health care provider.  Take over-the-counter and prescription medicines only as told by your health care provider.  Keep all follow-up visits as told by your health care provider. This is important. Contact a health care provider if your symptoms:  Get worse.  Do not improve in 2 weeks. Get help right away if you have:  Severe pain and trouble with walking. Summary  A  bunion is a bump on the base of the big toe that forms when the bones of the big toe joint move out of position.  Bunions can make walking painful.  Treatment depends on the severity of your symptoms.  Support your toe joint with proper footwear, shoe padding, or taping as told by your health care provider. This information is not intended to replace advice given to you by your health care provider. Make sure you discuss any questions you have with your health care provider. Document Revised: 08/29/2017 Document Reviewed: 07/05/2017 Elsevier Patient Education  2020 Elsevier Inc.  

## 2019-03-28 ENCOUNTER — Encounter: Payer: Self-pay | Admitting: Family Medicine

## 2019-03-28 DIAGNOSIS — M2012 Hallux valgus (acquired), left foot: Secondary | ICD-10-CM

## 2019-03-28 HISTORY — DX: Hallux valgus (acquired), left foot: M20.12

## 2019-04-10 DIAGNOSIS — E039 Hypothyroidism, unspecified: Secondary | ICD-10-CM | POA: Diagnosis not present

## 2019-04-10 DIAGNOSIS — R5383 Other fatigue: Secondary | ICD-10-CM | POA: Diagnosis not present

## 2019-04-10 DIAGNOSIS — N951 Menopausal and female climacteric states: Secondary | ICD-10-CM | POA: Diagnosis not present

## 2019-04-10 DIAGNOSIS — R6882 Decreased libido: Secondary | ICD-10-CM | POA: Diagnosis not present

## 2019-04-12 DIAGNOSIS — M2559 Pain in other specified joint: Secondary | ICD-10-CM | POA: Diagnosis not present

## 2019-04-12 DIAGNOSIS — N951 Menopausal and female climacteric states: Secondary | ICD-10-CM | POA: Diagnosis not present

## 2019-04-12 DIAGNOSIS — R6882 Decreased libido: Secondary | ICD-10-CM | POA: Diagnosis not present

## 2019-04-30 ENCOUNTER — Other Ambulatory Visit: Payer: Self-pay

## 2019-04-30 MED ORDER — PANTOPRAZOLE SODIUM 40 MG PO TBEC: 40.0000 mg | DELAYED_RELEASE_TABLET | Freq: Every day | ORAL | 1 refills | Status: DC

## 2019-07-12 DIAGNOSIS — E039 Hypothyroidism, unspecified: Secondary | ICD-10-CM | POA: Diagnosis not present

## 2019-07-12 DIAGNOSIS — N951 Menopausal and female climacteric states: Secondary | ICD-10-CM | POA: Diagnosis not present

## 2019-07-12 DIAGNOSIS — R5383 Other fatigue: Secondary | ICD-10-CM | POA: Diagnosis not present

## 2019-07-12 DIAGNOSIS — R6882 Decreased libido: Secondary | ICD-10-CM | POA: Diagnosis not present

## 2019-10-08 DIAGNOSIS — R5383 Other fatigue: Secondary | ICD-10-CM | POA: Diagnosis not present

## 2019-10-08 DIAGNOSIS — R6882 Decreased libido: Secondary | ICD-10-CM | POA: Diagnosis not present

## 2019-10-08 DIAGNOSIS — N951 Menopausal and female climacteric states: Secondary | ICD-10-CM | POA: Diagnosis not present

## 2019-10-10 DIAGNOSIS — R6882 Decreased libido: Secondary | ICD-10-CM | POA: Diagnosis not present

## 2019-10-10 DIAGNOSIS — R5383 Other fatigue: Secondary | ICD-10-CM | POA: Diagnosis not present

## 2019-10-10 DIAGNOSIS — N951 Menopausal and female climacteric states: Secondary | ICD-10-CM | POA: Diagnosis not present

## 2019-10-25 ENCOUNTER — Ambulatory Visit: Payer: BC Managed Care – PPO | Admitting: Sports Medicine

## 2019-10-25 ENCOUNTER — Encounter: Payer: Self-pay | Admitting: Sports Medicine

## 2019-10-25 ENCOUNTER — Other Ambulatory Visit: Payer: Self-pay

## 2019-10-25 DIAGNOSIS — B070 Plantar wart: Secondary | ICD-10-CM

## 2019-10-25 DIAGNOSIS — B351 Tinea unguium: Secondary | ICD-10-CM

## 2019-10-25 DIAGNOSIS — L603 Nail dystrophy: Secondary | ICD-10-CM

## 2019-10-25 DIAGNOSIS — M79675 Pain in left toe(s): Secondary | ICD-10-CM

## 2019-10-25 DIAGNOSIS — M2012 Hallux valgus (acquired), left foot: Secondary | ICD-10-CM

## 2019-10-25 NOTE — Patient Instructions (Signed)
Vinegar soaks 1 cup of white distilled vinegar to 8 cups of warm water.  Soak 20 mins. May repeat soak two times per week.  If there is thickness to nails may file nails after soaks or after bath/shower with nail file and apply vicks vapor rub or tea tree oil. Apply oil daily to nails after filing for the best result.

## 2019-10-25 NOTE — Progress Notes (Signed)
Subjective: Emily Mclean is a 49 y.o. female patient who returns to office today for follow-up evaluation of wart on the bottom of the right foot and also follow-up discussion about bunion and nail issue on the left.  Patient reports that the left first toenail gets painful when it grows out however if she keeps to trim it does not bother her and reports that she is still considering surgery for her bunion but wants to wait to after Covid or later in the spring of next year.  Patient reports new issue of wart on the bottom of the right foot has tried over-the-counter treatment for the last several weeks with no improvement.  Patient denies any other pedal complaints or any other symptoms at this time.  Patient Active Problem List   Diagnosis Date Noted  . Hallux valgus (acquired), left foot 03/28/2019  . Generalized abdominal pain 11/08/2018  . Non-intractable vomiting 11/08/2018  . History of cold-induced urticaria 12/08/2015  . Preventative health care 04/22/2014  . Abdominal pain, chronic, epigastric 09/24/2009  . BMI 28.0-28.9,adult 07/17/2008    Current Outpatient Medications on File Prior to Visit  Medication Sig Dispense Refill  . azelastine (OPTIVAR) 0.05 % ophthalmic solution Place 1 drop into both eyes as needed.     . Cholecalciferol (VITAMIN D) 2000 UNITS CAPS Take by mouth.    . cyclobenzaprine (FLEXERIL) 10 MG tablet Take 1 tablet (10 mg total) by mouth 3 (three) times daily. 90 tablet 1  . EPINEPHrine 0.3 mg/0.3 mL IJ SOAJ injection     . fexofenadine (ALLEGRA) 180 MG tablet Take 180 mg by mouth daily.    . fluticasone (FLONASE) 50 MCG/ACT nasal spray Place 1 spray into both nostrils daily.     . montelukast (SINGULAIR) 10 MG tablet Take 10 mg by mouth at bedtime.     . pantoprazole (PROTONIX) 40 MG tablet Take 1 tablet (40 mg total) by mouth daily. 90 tablet 1   No current facility-administered medications on file prior to visit.    Allergies  Allergen Reactions  .  Cefuroxime Axetil     REACTION: red rash  . Cephalexin     REACTION: red rash  . Sulfamethoxazole-Trimethoprim     Objective:  General: Alert and oriented x3 in no acute distress  Dermatology: Hypertrophic keratotic skin plantar submet 4 on right foot with pinpoint capillaries noted consistent with wart, all nails x 10 are well manicured with severe thickening and discoloration at left 1st toenail like previous.  Vascular: Dorsalis Pedis and Posterior Tibial pedal pulses 2/4, Capillary Fill Time 3 seconds, (+) pedal hair growth bilateral, no edema bilateral lower extremities, Temperature gradient within normal limits.  Neurology: Johney Maine sensation intact via light touch bilateral.   Musculoskeletal: Mild tenderness palpation worse on the right foot.  Minimal tenderness with palpation left bunion deformity, no limitation or crepitus with range of motion, deformity reducible, no second toe crossover.  However structurally likely nail dystrophy is related to position of toe when in shoe due to bunion left greater than right.       Assessment and Plan: Problem List Items Addressed This Visit    None    Visit Diagnoses    Plantar wart    -  Primary   Hallux valgus of left foot       Nail dystrophy       Nail fungus       Toe pain, left           -  Complete examination performed -Discussed with patient treatment options for wart -Mechanically debrided wart plantar right foot using a sterile chisel blade then applied Cantharone and Band-Aid dressing and advised patient on blister reaction once occurs apply Neosporin and Band-Aid daily until the skin peels off in the area -Advised good hygiene habits to prevent reinfection -Discussed with patient treatment options for bunion and nail dystrophy at left hallux advised patient since likely her changes in her nail are also related to mechanical changes it is best to wait to have the nail issue and the bunion addressed at the same time surgically.   Patient expressed understanding reports that she wants to wait until spring for doing anything for her bunion and the nail issue. -Patient to return to office in 4 to 5 weeks for follow-up evaluation of right foot wart or sooner if problems or issues arise.  Landis Martins, DPM

## 2019-10-27 ENCOUNTER — Other Ambulatory Visit: Payer: Self-pay | Admitting: Nurse Practitioner

## 2019-10-29 NOTE — Telephone Encounter (Signed)
Patient needs OV.

## 2019-11-02 ENCOUNTER — Encounter: Payer: Self-pay | Admitting: Family Medicine

## 2019-11-02 ENCOUNTER — Other Ambulatory Visit: Payer: Self-pay

## 2019-11-02 ENCOUNTER — Ambulatory Visit (INDEPENDENT_AMBULATORY_CARE_PROVIDER_SITE_OTHER): Payer: BC Managed Care – PPO | Admitting: Family Medicine

## 2019-11-02 VITALS — BP 102/72 | HR 73 | Temp 98.2°F | Ht 65.0 in | Wt 189.0 lb

## 2019-11-02 DIAGNOSIS — Z130 Encounter for screening for diseases of the blood and blood-forming organs and certain disorders involving the immune mechanism: Secondary | ICD-10-CM | POA: Diagnosis not present

## 2019-11-02 DIAGNOSIS — Z1159 Encounter for screening for other viral diseases: Secondary | ICD-10-CM | POA: Diagnosis not present

## 2019-11-02 DIAGNOSIS — Z0000 Encounter for general adult medical examination without abnormal findings: Secondary | ICD-10-CM | POA: Diagnosis not present

## 2019-11-02 DIAGNOSIS — E039 Hypothyroidism, unspecified: Secondary | ICD-10-CM | POA: Insufficient documentation

## 2019-11-02 DIAGNOSIS — Z23 Encounter for immunization: Secondary | ICD-10-CM

## 2019-11-02 DIAGNOSIS — E669 Obesity, unspecified: Secondary | ICD-10-CM | POA: Insufficient documentation

## 2019-11-02 DIAGNOSIS — R7989 Other specified abnormal findings of blood chemistry: Secondary | ICD-10-CM | POA: Diagnosis not present

## 2019-11-02 DIAGNOSIS — Z131 Encounter for screening for diabetes mellitus: Secondary | ICD-10-CM

## 2019-11-02 DIAGNOSIS — Z1211 Encounter for screening for malignant neoplasm of colon: Secondary | ICD-10-CM | POA: Diagnosis not present

## 2019-11-02 LAB — CBC WITH DIFFERENTIAL/PLATELET
Basophils Absolute: 0.1 10*3/uL (ref 0.0–0.1)
Basophils Relative: 1.4 % (ref 0.0–3.0)
Eosinophils Absolute: 0.2 10*3/uL (ref 0.0–0.7)
Eosinophils Relative: 1.9 % (ref 0.0–5.0)
HCT: 42 % (ref 36.0–46.0)
Hemoglobin: 14.4 g/dL (ref 12.0–15.0)
Lymphocytes Relative: 23.3 % (ref 12.0–46.0)
Lymphs Abs: 2.5 10*3/uL (ref 0.7–4.0)
MCHC: 34.3 g/dL (ref 30.0–36.0)
MCV: 95.1 fl (ref 78.0–100.0)
Monocytes Absolute: 0.5 10*3/uL (ref 0.1–1.0)
Monocytes Relative: 5.1 % (ref 3.0–12.0)
Neutro Abs: 7.3 10*3/uL (ref 1.4–7.7)
Neutrophils Relative %: 68.3 % (ref 43.0–77.0)
Platelets: 376 10*3/uL (ref 150.0–400.0)
RBC: 4.42 Mil/uL (ref 3.87–5.11)
RDW: 13 % (ref 11.5–15.5)
WBC: 10.6 10*3/uL — ABNORMAL HIGH (ref 4.0–10.5)

## 2019-11-02 LAB — COMPREHENSIVE METABOLIC PANEL
ALT: 11 U/L (ref 0–35)
AST: 14 U/L (ref 0–37)
Albumin: 4.1 g/dL (ref 3.5–5.2)
Alkaline Phosphatase: 65 U/L (ref 39–117)
BUN: 7 mg/dL (ref 6–23)
CO2: 27 mEq/L (ref 19–32)
Calcium: 9.1 mg/dL (ref 8.4–10.5)
Chloride: 105 mEq/L (ref 96–112)
Creatinine, Ser: 0.67 mg/dL (ref 0.40–1.20)
GFR: 94.41 mL/min (ref >60.00)
Glucose, Bld: 84 mg/dL (ref 70–99)
Potassium: 4.4 mEq/L (ref 3.5–5.1)
Sodium: 139 mEq/L (ref 135–145)
Total Bilirubin: 0.5 mg/dL (ref 0.2–1.2)
Total Protein: 6.8 g/dL (ref 6.0–8.3)

## 2019-11-02 LAB — HEMOGLOBIN A1C: Hgb A1c MFr Bld: 5.7 % (ref 4.6–6.5)

## 2019-11-02 LAB — LIPID PANEL
Cholesterol: 168 mg/dL (ref 0–200)
HDL: 46.4 mg/dL (ref >39.00)
LDL Cholesterol: 104 mg/dL — ABNORMAL HIGH (ref 0–99)
NonHDL: 121.62
Total CHOL/HDL Ratio: 4
Triglycerides: 88 mg/dL (ref 0.0–149.0)
VLDL: 17.6 mg/dL (ref 0.0–40.0)

## 2019-11-02 LAB — TSH: TSH: 2.89 u[IU]/mL (ref 0.35–4.50)

## 2019-11-02 NOTE — Patient Instructions (Signed)
Health Maintenance, Female Adopting a healthy lifestyle and getting preventive care are important in promoting health and wellness. Ask your health care provider about:  The right schedule for you to have regular tests and exams.  Things you can do on your own to prevent diseases and keep yourself healthy. What should I know about diet, weight, and exercise? Eat a healthy diet   Eat a diet that includes plenty of vegetables, fruits, low-fat dairy products, and lean protein.  Do not eat a lot of foods that are high in solid fats, added sugars, or sodium. Maintain a healthy weight Body mass index (BMI) is used to identify weight problems. It estimates body fat based on height and weight. Your health care provider can help determine your BMI and help you achieve or maintain a healthy weight. Get regular exercise Get regular exercise. This is one of the most important things you can do for your health. Most adults should:  Exercise for at least 150 minutes each week. The exercise should increase your heart rate and make you sweat (moderate-intensity exercise).  Do strengthening exercises at least twice a week. This is in addition to the moderate-intensity exercise.  Spend less time sitting. Even light physical activity can be beneficial. Watch cholesterol and blood lipids Have your blood tested for lipids and cholesterol at 47 years of age, then have this test every 5 years. Have your cholesterol levels checked more often if:  Your lipid or cholesterol levels are high.  You are older than 47 years of age.  You are at high risk for heart disease. What should I know about cancer screening? Depending on your health history and family history, you may need to have cancer screening at various ages. This may include screening for:  Breast cancer.  Cervical cancer.  Colorectal cancer.  Skin cancer.  Lung cancer. What should I know about heart disease, diabetes, and high blood  pressure? Blood pressure and heart disease  High blood pressure causes heart disease and increases the risk of stroke. This is more likely to develop in people who have high blood pressure readings, are of African descent, or are overweight.  Have your blood pressure checked: ? Every 3-5 years if you are 18-39 years of age. ? Every year if you are 40 years old or older. Diabetes Have regular diabetes screenings. This checks your fasting blood sugar level. Have the screening done:  Once every three years after age 40 if you are at a normal weight and have a low risk for diabetes.  More often and at a younger age if you are overweight or have a high risk for diabetes. What should I know about preventing infection? Hepatitis B If you have a higher risk for hepatitis B, you should be screened for this virus. Talk with your health care provider to find out if you are at risk for hepatitis B infection. Hepatitis C Testing is recommended for:  Everyone born from 1945 through 1965.  Anyone with known risk factors for hepatitis C. Sexually transmitted infections (STIs)  Get screened for STIs, including gonorrhea and chlamydia, if: ? You are sexually active and are younger than 47 years of age. ? You are older than 47 years of age and your health care provider tells you that you are at risk for this type of infection. ? Your sexual activity has changed since you were last screened, and you are at increased risk for chlamydia or gonorrhea. Ask your health care provider if   you are at risk.  Ask your health care provider about whether you are at high risk for HIV. Your health care provider may recommend a prescription medicine to help prevent HIV infection. If you choose to take medicine to prevent HIV, you should first get tested for HIV. You should then be tested every 3 months for as long as you are taking the medicine. Pregnancy  If you are about to stop having your period (premenopausal) and  you may become pregnant, seek counseling before you get pregnant.  Take 400 to 800 micrograms (mcg) of folic acid every day if you become pregnant.  Ask for birth control (contraception) if you want to prevent pregnancy. Osteoporosis and menopause Osteoporosis is a disease in which the bones lose minerals and strength with aging. This can result in bone fractures. If you are 65 years old or older, or if you are at risk for osteoporosis and fractures, ask your health care provider if you should:  Be screened for bone loss.  Take a calcium or vitamin D supplement to lower your risk of fractures.  Be given hormone replacement therapy (HRT) to treat symptoms of menopause. Follow these instructions at home: Lifestyle  Do not use any products that contain nicotine or tobacco, such as cigarettes, e-cigarettes, and chewing tobacco. If you need help quitting, ask your health care provider.  Do not use street drugs.  Do not share needles.  Ask your health care provider for help if you need support or information about quitting drugs. Alcohol use  Do not drink alcohol if: ? Your health care provider tells you not to drink. ? You are pregnant, may be pregnant, or are planning to become pregnant.  If you drink alcohol: ? Limit how much you use to 0-1 drink a day. ? Limit intake if you are breastfeeding.  Be aware of how much alcohol is in your drink. In the [place] one drink equals one 12 oz bottle of beer (355 mL), one 5 oz glass of wine (148 mL), or one 1 oz glass of hard liquor (44 mL). General instructions  Schedule regular health, dental, and eye exams.  Stay current with your vaccines.  Tell your health care provider if: ? You often feel depressed. ? You have ever been abused or do not feel safe at home. Summary  Adopting a healthy lifestyle and getting preventive care are important in promoting health and wellness.  Follow your health care provider's instructions about healthy  diet, exercising, and getting tested or screened for diseases.  Follow your health care provider's instructions on monitoring your cholesterol and blood pressure. This information is not intended to replace advice given to you by your health care provider. Make sure you discuss any questions you have with your health care provider. Document Revised: 02/15/2018 Document Reviewed: 02/15/2018 Elsevier Patient Education  2020 Elsevier Inc.  

## 2019-11-02 NOTE — Progress Notes (Signed)
This visit occurred during the SARS-CoV-2 public health emergency.  Safety protocols were in place, including screening questions prior to the visit, additional usage of staff PPE, and extensive cleaning of exam room while observing appropriate contact time as indicated for disinfecting solutions.    Patient ID: Emily Mclean, female  DOB: 10/27/1970, 49 y.o.   MRN: [id number] Patient Care Team    Relationship Specialty Notifications Start End  Ma Hillock, DO PCP - General Family Medicine  12/10/15   Armbruster, Carlota Raspberry, MD Consulting Physician Gastroenterology  11/15/18   Landis Martins, [place] Consulting Physician Podiatry  03/28/19     Chief Complaint  Patient presents with  . Annual Exam    FASTING. had plantars wart worked [facility] at [facility] and would like it looked at to make sure it is healing right  . "pelvic acne"    Subjective: Emily Mclean is a 49 y.o.  Female  present for CPE. All past medical history, surgical history, allergies, family history, immunizations, medications and social history were updated in the electronic medical record today. All recent labs, ED visits and hospitalizations within the last year were reviewed.  Health maintenance:  Colonoscopy: No family history.  Routine screening due.  Patient elected to have Cologuard testing. Mammogram: No family history of breast cancer.  Mammogram completed at gynecology office 11/23/2018 and she has her 2021 mammogram scheduled.  Records requested. Cervical cancer screening: last pap: 11/23/2018, completed by: [facility] Dr. Etter Sjogren.  Records requested Immunizations: tdap overdue-administered today, Influenza patient has completed at work (encouraged yearly), Covid series completed. Infectious disease screening: HIV completed , Hep C screening desired and completed today. DEXA: Routine screening at age 84-65 Assistive device: None Oxygen use: None Patient has a Dental home. Hospitalizations/ED  visits: Reviewed  Depression screen [facility] 2/9 11/02/2019  Decreased Interest 0  Down, Depressed, Hopeless 0  PHQ - 2 Score 0   No flowsheet data found.   Immunization History  Administered Date(s) Administered  . Influenza Inj Mdck Quad Pf 12/23/2017, 12/24/2017  . Influenza Split 12/07/2012  . Influenza Whole 03/09/2007, 01/07/2008  . Influenza, Seasonal, Injecte, Preservative Fre 01/06/2015  . Influenza,inj,Quad PF,6+ Mos 12/22/2015, 01/05/2017  . Influenza-Unspecified 01/11/2014, 12/05/2015  . PFIZER SARS-COV-2 Vaccination 05/24/2019, 06/18/2019  . Td 03/08/2006    Past Medical History:  Diagnosis Date  . Allergy   . Heart murmur   . Urticaria    Allergies  Allergen Reactions  . Cefuroxime Axetil     REACTION: red rash  . Cephalexin     REACTION: red rash  . Sulfamethoxazole-Trimethoprim    Past Surgical History:  Procedure Laterality Date  . NASAL SINUS SURGERY     polyps removed   Family History  Problem Relation Age of Onset  . Diabetes Mother   . Kidney disease Mother   . Depression Mother   . Allergic rhinitis Mother   . Diabetes Father   . Hypertension Father   . Heart disease Father   . Diabetes Brother   . Hypertension Brother   . Kidney disease Maternal Aunt   . Depression Maternal Grandfather        suicide  . Cancer Maternal Grandfather        lymphoma   Social History   Social History Narrative   Live w/husband "[name]" & 3 kids [name], [place], [place]).    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    Smokes daily. Rare use of alcohol, no drugs.  Drinks some caffeine, uses herbal remedies.   Wears her seatbelt, bicycle helmet, smoke detector in the home.    Exercises routinely.    Feels safe in her relationships.     Allergies as of 11/02/2019      Reactions   Cefuroxime Axetil    REACTION: red rash   Cephalexin    REACTION: red rash   Sulfamethoxazole-trimethoprim       Medication List       Accurate as of November 02, 2019   8:52 AM. If you have any questions, ask your nurse or doctor.        azelastine 0.05 % ophthalmic solution Commonly known as: OPTIVAR Place 1 drop into both eyes as needed.   cyclobenzaprine 10 MG tablet Commonly known as: FLEXERIL Take 1 tablet (10 mg total) by mouth 3 (three) times daily.   EPINEPHrine 0.3 mg/0.3 mL Soaj injection Commonly known as: EPI-PEN   fexofenadine 180 MG tablet Commonly known as: ALLEGRA Take 180 mg by mouth daily.   fluticasone 50 MCG/ACT nasal spray Commonly known as: FLONASE Place 1 spray into both nostrils daily.   montelukast 10 MG tablet Commonly known as: SINGULAIR Take 10 mg by mouth at bedtime.   pantoprazole 40 MG tablet Commonly known as: PROTONIX TAKE 1 TABLET BY MOUTH EVERY DAY What changed:   how much to take  how to take this  when to take this  reasons to take this   Vitamin D 50 MCG (2000 UT) Caps Take by mouth.       All past medical history, surgical history, allergies, family history, immunizations andmedications were updated in the EMR today and reviewed under the history and medication portions of their EMR.     No results found for this or any previous visit (from the past 2160 hour(s)).  ROS: 14 pt review of systems performed and negative (unless mentioned in an HPI)  Objective: BP 102/72 (BP Location: Left Arm, Patient Position: Sitting, Cuff Size: Normal)   Pulse 73   Temp 98.2 F (36.8 C) (Oral)   Ht 5\' 5"  (1.651 m)   Wt 189 lb (85.7 kg)   SpO2 97%   BMI 31.45 kg/m  Gen: Afebrile. No acute distress. Nontoxic in appearance, well-developed, well-nourished, pleasant female HENT: AT. [place]. Bilateral TM visualized and normal in appearance, normal external auditory canal. MMM, no oral lesions, adequate dentition. Bilateral nares within normal limits. Throat without erythema, ulcerations or exudates.  No cough on exam, no hoarseness on exam. Eyes:Pupils Equal Round Reactive to light, Extraocular movements  intact,  Conjunctiva without redness, discharge or icterus. Neck/lymp/endocrine: Supple, no lymphadenopathy, no thyromegaly CV: RRR no murmur, no edema, +2/4 P posterior tibialis pulses. No JVD. Chest: CTAB, no wheeze, rhonchi or crackles.  Normal respiratory effort.  Good air movement. Abd: Soft.  Flat. NTND. BS present.  No masses palpated. No hepatosplenomegaly. No rebound tenderness or guarding.  Skin: No rashes, purpura or petechiae. Warm and well-perfused. Skin intact.  Plantars wart plantar aspect of foot between fourth-fifth toes. Neuro/Msk:  Normal gait. PERLA. EOMi. Alert. Oriented x3.  Cranial nerves II through XII intact. Muscle strength 5/5 upper/lower extremity. DTRs equal bilaterally. Psych: Normal affect, dress and demeanor. Normal speech. Normal thought content and judgment.   No exam data present  Assessment/plan: Emily Mclean is a 49 y.o. female present for  Colon cancer screening - Cologuard Need for hepatitis C screening test - Hepatitis C Antibody Diabetes mellitus screening - Hemoglobin A1c Obesity (  BMI 30-39.9) - Lipid panel Elevated TSH - TSH Hypocalcemia - Comprehensive metabolic panel> ensure returned normal. If abnormal today will need to check Vit d and PTH levels Screening for deficiency anemia - CBC with Differential/Platelet Need for Tdap vaccination - Tdap vaccine greater than or equal to 7yo IM Encounter for preventive health examination Patient was encouraged to exercise greater than 150 minutes a week. Patient was encouraged to choose a diet filled with fresh fruits and vegetables, and lean meats. AVS provided to patient today for education/recommendation on gender specific health and safety maintenance. Colonoscopy:  Patient elected to have Cologuard testing. Mammogram: UTD through GYN Cervical cancer screening: UTD ordered through GYN Immunizations: tdap administered today, Influenza patient has completed at work (encouraged yearly),  Covid series completed. Infectious disease screening: HIV completed , Hep C screening desired and completed today. DEXA: Routine screening at age 26-65   Return in about 1 year (around 11/01/2020) for CPE (30 min).  Orders Placed This Encounter  Procedures  . CBC with Differential/Platelet  . Comprehensive metabolic panel  . Hemoglobin A1c  . TSH  . Lipid panel  . Cologuard  . Hepatitis C Antibody    Orders Placed This Encounter  Procedures  . CBC with Differential/Platelet  . Comprehensive metabolic panel  . Hemoglobin A1c  . TSH  . Lipid panel  . Cologuard  . Hepatitis C Antibody   No orders of the defined types were placed in this encounter.  Referral Orders  No referral(s) requested today     Electronically signed by: Howard Pouch, [place]

## 2019-11-05 LAB — HEPATITIS C ANTIBODY
Hepatitis C Ab: NONREACTIVE
SIGNAL TO CUT-OFF: 0.01 (ref <1.00)

## 2019-11-15 DIAGNOSIS — J206 Acute bronchitis due to rhinovirus: Secondary | ICD-10-CM | POA: Diagnosis not present

## 2019-11-22 ENCOUNTER — Other Ambulatory Visit: Payer: Self-pay

## 2019-11-22 ENCOUNTER — Encounter: Payer: Self-pay | Admitting: Sports Medicine

## 2019-11-22 ENCOUNTER — Ambulatory Visit: Payer: BC Managed Care – PPO | Admitting: Sports Medicine

## 2019-11-22 DIAGNOSIS — M79671 Pain in right foot: Secondary | ICD-10-CM

## 2019-11-22 DIAGNOSIS — B070 Plantar wart: Secondary | ICD-10-CM

## 2019-11-22 DIAGNOSIS — M79672 Pain in left foot: Secondary | ICD-10-CM

## 2019-11-22 NOTE — Progress Notes (Signed)
Subjective: Emily Mclean is a 49 y.o. female patient who presents to office for evaluation of Right> Left foot pain secondary to moderately painful wart at the bottom of the feet.  Patient reports that there is no longer pain to these areas but has been peeling dead skin and has a new spot on the left heel that she tried some over-the-counter treatment for.  No other pedal complaints noted.  Patient Active Problem List   Diagnosis Date Noted  . Obesity (BMI 30-39.9) 11/02/2019  . Hypocalcemia 11/02/2019  . Elevated TSH 11/02/2019  . Hallux valgus (acquired), left foot 03/28/2019  . Generalized abdominal pain 11/08/2018  . Non-intractable vomiting 11/08/2018  . History of cold-induced urticaria 12/08/2015  . Preventative health care 04/22/2014  . Abdominal pain, chronic, epigastric 09/24/2009  . BMI 28.0-28.9,adult 07/17/2008    Current Outpatient Medications on File Prior to Visit  Medication Sig Dispense Refill  . azelastine (OPTIVAR) 0.05 % ophthalmic solution Place 1 drop into both eyes as needed.     . Cholecalciferol (VITAMIN D) 2000 UNITS CAPS Take by mouth.    . cyclobenzaprine (FLEXERIL) 10 MG tablet Take 1 tablet (10 mg total) by mouth 3 (three) times daily. 90 tablet 1  . EPINEPHrine 0.3 mg/0.3 mL IJ SOAJ injection     . fexofenadine (ALLEGRA) 180 MG tablet Take 180 mg by mouth daily.    . fluticasone (FLONASE) 50 MCG/ACT nasal spray Place 1 spray into both nostrils daily.     . montelukast (SINGULAIR) 10 MG tablet Take 10 mg by mouth at bedtime.     . pantoprazole (PROTONIX) 40 MG tablet TAKE 1 TABLET BY MOUTH EVERY DAY (Patient taking differently: daily as needed. ) 90 tablet 0   No current facility-administered medications on file prior to visit.    Allergies  Allergen Reactions  . Cefuroxime Axetil     REACTION: red rash  . Cephalexin     REACTION: red rash  . Sulfamethoxazole-Trimethoprim     Objective:  General: Alert and oriented x3 in no acute  distress  Dermatology: Keratotic lesion present measuring less than 0.5 cm at the right foot plantar sulcus underneath the fourth and fifth toes and the left plantar lateral heel with no skin lines transversing the lesion, minimal pain is present with medial lateral pressure to the lesion, capillaries with pin point bleeding noted, no webspace macerations, no ecchymosis bilateral, all nails x 10 are well manicured with minimal thickness noted.  The left hallux nail.  Vascular: Dorsalis Pedis and Posterior Tibial pedal pulses 2/4, Capillary Fill Time 3 seconds, + pedal hair growth bilateral, no edema bilateral lower extremities, Temperature gradient within normal limits.  Neurology: Johney Maine sensation intact via light touch bilateral.  Musculoskeletal: No significant reproducible tenderness to the lesion sites bilateral, Muscular strength 5/5 in all groups without pain or limitation on range of motion at left bunion unchanged from prior.  Assessment and Plan: Problem List Items Addressed This Visit    None    Visit Diagnoses    Plantar wart    -  Primary   Foot pain, bilateral          -Complete examination performed -Discussed treatment options for warts -Parred keratoic warty lesions x2 using a chisel blade; treated the area with Catharidin covered with bandaid; Advised patient of blistering reaction that will occur from application of medication and once this happens replace bandaid with neosporin and tape/bandaid.  This is treatment #2 on the right and treatment #  1 on the left. -Encourage patient to continue with good hygiene habits -Patient to return to office as scheduled in 1 month for follow-up of wart or sooner if condition worsens.  Landis Martins, DPM

## 2019-12-11 DIAGNOSIS — Z20822 Contact with and (suspected) exposure to covid-19: Secondary | ICD-10-CM | POA: Diagnosis not present

## 2019-12-15 DIAGNOSIS — Z20822 Contact with and (suspected) exposure to covid-19: Secondary | ICD-10-CM | POA: Diagnosis not present

## 2019-12-27 DIAGNOSIS — Z01419 Encounter for gynecological examination (general) (routine) without abnormal findings: Secondary | ICD-10-CM | POA: Diagnosis not present

## 2019-12-27 DIAGNOSIS — Z6831 Body mass index (BMI) 31.0-31.9, adult: Secondary | ICD-10-CM | POA: Diagnosis not present

## 2019-12-27 DIAGNOSIS — Z1231 Encounter for screening mammogram for malignant neoplasm of breast: Secondary | ICD-10-CM | POA: Diagnosis not present

## 2019-12-28 DIAGNOSIS — Z01419 Encounter for gynecological examination (general) (routine) without abnormal findings: Secondary | ICD-10-CM | POA: Diagnosis not present

## 2020-01-03 ENCOUNTER — Encounter: Payer: Self-pay | Admitting: Sports Medicine

## 2020-01-03 ENCOUNTER — Ambulatory Visit: Payer: BC Managed Care – PPO | Admitting: Sports Medicine

## 2020-01-03 ENCOUNTER — Other Ambulatory Visit: Payer: Self-pay

## 2020-01-03 DIAGNOSIS — B070 Plantar wart: Secondary | ICD-10-CM

## 2020-01-03 DIAGNOSIS — M79671 Pain in right foot: Secondary | ICD-10-CM | POA: Diagnosis not present

## 2020-01-03 DIAGNOSIS — M79672 Pain in left foot: Secondary | ICD-10-CM

## 2020-01-03 DIAGNOSIS — B359 Dermatophytosis, unspecified: Secondary | ICD-10-CM | POA: Diagnosis not present

## 2020-01-03 DIAGNOSIS — L853 Xerosis cutis: Secondary | ICD-10-CM

## 2020-01-03 NOTE — Progress Notes (Signed)
Subjective: Emily Mclean is a 49 y.o. female patient who presents to office for follow-up evaluation of warts bilateral.  Patient reports that both feet are doing very well can no longer see the areas where she previously had warts.  Patient does admit that she has dry skin to the bottoms of both feet but denies any other pedal complaints at this time.  Patient Active Problem List   Diagnosis Date Noted  . Obesity (BMI 30-39.9) 11/02/2019  . Hypocalcemia 11/02/2019  . Elevated TSH 11/02/2019  . Hallux valgus (acquired), left foot 03/28/2019  . Generalized abdominal pain 11/08/2018  . Non-intractable vomiting 11/08/2018  . History of cold-induced urticaria 12/08/2015  . Preventative health care 04/22/2014  . Abdominal pain, chronic, epigastric 09/24/2009  . BMI 28.0-28.9,adult 07/17/2008    Current Outpatient Medications on File Prior to Visit  Medication Sig Dispense Refill  . azelastine (OPTIVAR) 0.05 % ophthalmic solution Place 1 drop into both eyes as needed.     . Cholecalciferol (VITAMIN D) 2000 UNITS CAPS Take by mouth.    . cyclobenzaprine (FLEXERIL) 10 MG tablet Take 1 tablet (10 mg total) by mouth 3 (three) times daily. 90 tablet 1  . EPINEPHrine 0.3 mg/0.3 mL IJ SOAJ injection     . fexofenadine (ALLEGRA) 180 MG tablet Take 180 mg by mouth daily.    . fluticasone (FLONASE) 50 MCG/ACT nasal spray Place 1 spray into both nostrils daily.     . montelukast (SINGULAIR) 10 MG tablet Take 10 mg by mouth at bedtime.     . pantoprazole (PROTONIX) 40 MG tablet TAKE 1 TABLET BY MOUTH EVERY DAY (Patient taking differently: daily as needed. ) 90 tablet 0   No current facility-administered medications on file prior to visit.    Allergies  Allergen Reactions  . Cefuroxime Axetil     REACTION: red rash  . Cephalexin     REACTION: red rash  . Sulfamethoxazole-Trimethoprim     Objective:  General: Alert and oriented x3 in no acute distress  Dermatology: Plantar warts have  resolved at the sulcus of the right foot underneath the fourth and fifth toes and to the plantar lateral left heel.  There is no pain to palpation but there is significant superimposed dry skin with scaling that extends into the interspaces bilateral.  There is no webspace maceration no malodor nails x10 are well manicured with minimal thickness and most involvement of thickness noted to the left hallux. Vascular: Dorsalis Pedis and Posterior Tibial pedal pulses 2/4, Capillary Fill Time 3 seconds, + pedal hair growth bilateral, no edema bilateral lower extremities, Temperature gradient within normal limits.  Neurology: Johney Maine sensation intact via light touch bilateral.  Musculoskeletal: No significant reproducible tenderness to the lesion sites bilateral, Muscular strength 5/5 in all groups without pain or limitation on range of motion at left bunion like previous.  Assessment and Plan: Problem List Items Addressed This Visit    None    Visit Diagnoses    Plantar wart    -  Primary   Foot pain, bilateral       Tinea       Dry skin          -Complete examination performed -Previous warts have resolved -Discussed treatment for dry skin with possible tinea -Advised patient to get over-the-counter Lotrimin or Lamisil spray to use daily at bedtime until dry scaly skin areas have resolved -Advised patient to use a good skin cream after bath or shower gave sample of  foot miracle cream -Encouraged patient to continue with good hygiene habits and spraying out shoes monthly with Lysol to prevent reinfection and to clean after use of shower or bathroom -Patient to return to office as needed or sooner if condition worsens.  Landis Martins, DPM

## 2020-01-04 DIAGNOSIS — Z23 Encounter for immunization: Secondary | ICD-10-CM | POA: Diagnosis not present

## 2020-01-10 DIAGNOSIS — N951 Menopausal and female climacteric states: Secondary | ICD-10-CM | POA: Diagnosis not present

## 2020-01-10 DIAGNOSIS — E039 Hypothyroidism, unspecified: Secondary | ICD-10-CM | POA: Diagnosis not present

## 2020-01-14 DIAGNOSIS — R5383 Other fatigue: Secondary | ICD-10-CM | POA: Diagnosis not present

## 2020-01-14 DIAGNOSIS — Z6830 Body mass index (BMI) 30.0-30.9, adult: Secondary | ICD-10-CM | POA: Diagnosis not present

## 2020-01-14 DIAGNOSIS — R6882 Decreased libido: Secondary | ICD-10-CM | POA: Diagnosis not present

## 2020-01-14 DIAGNOSIS — N951 Menopausal and female climacteric states: Secondary | ICD-10-CM | POA: Diagnosis not present

## 2020-01-16 DIAGNOSIS — J3089 Other allergic rhinitis: Secondary | ICD-10-CM | POA: Diagnosis not present

## 2020-01-16 DIAGNOSIS — J301 Allergic rhinitis due to pollen: Secondary | ICD-10-CM | POA: Diagnosis not present

## 2020-01-16 DIAGNOSIS — L502 Urticaria due to cold and heat: Secondary | ICD-10-CM | POA: Diagnosis not present

## 2020-01-16 DIAGNOSIS — H1045 Other chronic allergic conjunctivitis: Secondary | ICD-10-CM | POA: Diagnosis not present

## 2020-02-14 ENCOUNTER — Telehealth: Payer: BC Managed Care – PPO | Admitting: Internal Medicine

## 2020-02-14 ENCOUNTER — Other Ambulatory Visit: Payer: Self-pay

## 2020-02-15 ENCOUNTER — Ambulatory Visit: Payer: BC Managed Care – PPO | Admitting: Family Medicine

## 2020-02-15 ENCOUNTER — Other Ambulatory Visit: Payer: Self-pay

## 2020-02-15 ENCOUNTER — Telehealth: Payer: Self-pay | Admitting: Family Medicine

## 2020-02-15 ENCOUNTER — Encounter: Payer: Self-pay | Admitting: Family Medicine

## 2020-02-15 VITALS — BP 129/83 | HR 93 | Temp 98.1°F | Ht 65.0 in | Wt 195.0 lb

## 2020-02-15 DIAGNOSIS — J309 Allergic rhinitis, unspecified: Secondary | ICD-10-CM | POA: Insufficient documentation

## 2020-02-15 DIAGNOSIS — J301 Allergic rhinitis due to pollen: Secondary | ICD-10-CM | POA: Insufficient documentation

## 2020-02-15 DIAGNOSIS — R42 Dizziness and giddiness: Secondary | ICD-10-CM | POA: Insufficient documentation

## 2020-02-15 DIAGNOSIS — L502 Urticaria due to cold and heat: Secondary | ICD-10-CM | POA: Insufficient documentation

## 2020-02-15 DIAGNOSIS — R059 Cough, unspecified: Secondary | ICD-10-CM | POA: Insufficient documentation

## 2020-02-15 DIAGNOSIS — H1045 Other chronic allergic conjunctivitis: Secondary | ICD-10-CM | POA: Insufficient documentation

## 2020-02-15 LAB — CBC WITH DIFFERENTIAL/PLATELET
Basophils Absolute: 0.1 10*3/uL (ref 0.0–0.1)
Basophils Relative: 0.9 % (ref 0.0–3.0)
Eosinophils Absolute: 0.3 10*3/uL (ref 0.0–0.7)
Eosinophils Relative: 2.9 % (ref 0.0–5.0)
HCT: 43.4 % (ref 36.0–46.0)
Hemoglobin: 14.8 g/dL (ref 12.0–15.0)
Lymphocytes Relative: 29 % (ref 12.0–46.0)
Lymphs Abs: 3 10*3/uL (ref 0.7–4.0)
MCHC: 34.1 g/dL (ref 30.0–36.0)
MCV: 94.6 fl (ref 78.0–100.0)
Monocytes Absolute: 0.5 10*3/uL (ref 0.1–1.0)
Monocytes Relative: 5.1 % (ref 3.0–12.0)
Neutro Abs: 6.4 10*3/uL (ref 1.4–7.7)
Neutrophils Relative %: 62.1 % (ref 43.0–77.0)
Platelets: 450 10*3/uL — ABNORMAL HIGH (ref 150.0–400.0)
RBC: 4.58 Mil/uL (ref 3.87–5.11)
RDW: 13.5 % (ref 11.5–15.5)
WBC: 10.2 10*3/uL (ref 4.0–10.5)

## 2020-02-15 LAB — BASIC METABOLIC PANEL
BUN: 9 mg/dL (ref 6–23)
CO2: 29 mEq/L (ref 19–32)
Calcium: 9.6 mg/dL (ref 8.4–10.5)
Chloride: 102 mEq/L (ref 96–112)
Creatinine, Ser: 0.75 mg/dL (ref 0.40–1.20)
GFR: 95 mL/min (ref >60.00)
Glucose, Bld: 98 mg/dL (ref 70–99)
Potassium: 4.5 mEq/L (ref 3.5–5.1)
Sodium: 139 mEq/L (ref 135–145)

## 2020-02-15 MED ORDER — DOXYCYCLINE HYCLATE 100 MG PO TABS: 100.0000 mg | ORAL_TABLET | Freq: Two times a day (BID) | ORAL | 0 refills | Status: DC

## 2020-02-15 NOTE — Telephone Encounter (Signed)
.  gec

## 2020-02-15 NOTE — Patient Instructions (Addendum)
Rest. Hydrate (90 ounces at least).  We will call you tomorrow w/ lab results and decide on treatments.   Dizziness Dizziness is a common problem. It makes you feel unsteady or light-headed. You may feel like you are about to pass out (faint). Dizziness can lead to getting hurt if you stumble or fall. Dizziness can be caused by many things, including:  Medicines.  Not having enough water in your body (dehydration).  Illness. Follow these instructions at home: Eating and drinking   Drink enough fluid to keep your pee (urine) clear or pale yellow. This helps to keep you from getting dehydrated. Try to drink more clear fluids, such as water.  Do not drink alcohol.  Limit how much caffeine you drink or eat, if your doctor tells you to do that.  Limit how much salt (sodium) you drink or eat, if your doctor tells you to do that. Activity   Avoid making quick movements. ? When you stand up from sitting in a chair, steady yourself until you feel okay. ? In the morning, first sit up on the side of the bed. When you feel okay, stand slowly while you hold onto something. Do this until you know that your balance is fine.  If you need to stand in one place for a long time, move your legs often. Tighten and relax the muscles in your legs while you are standing.  Do not drive or use heavy machinery if you feel dizzy.  Avoid bending down if you feel dizzy. Place items in your home so you can reach them easily without leaning over. Lifestyle  Do not use any products that contain nicotine or tobacco, such as cigarettes and e-cigarettes. If you need help quitting, ask your doctor.  Try to lower your stress level. You can do this by using methods such as yoga or meditation. Talk with your doctor if you need help. General instructions  Watch your dizziness for any changes.  Take over-the-counter and prescription medicines only as told by your doctor. Talk with your doctor if you think  that you are dizzy because of a medicine that you are taking.  Tell a friend or a family member that you are feeling dizzy. If he or she notices any changes in your behavior, have this person call your doctor.  Keep all follow-up visits as told by your doctor. This is important. Contact a doctor if:  Your dizziness does not go away.  Your dizziness or light-headedness gets worse.  You feel sick to your stomach (nauseous).  You have trouble hearing.  You have new symptoms.  You are unsteady on your feet.  You feel like the room is spinning. Get help right away if:  You throw up (vomit) or have watery poop (diarrhea), and you cannot eat or drink anything.  You have trouble: ? Talking. ? Walking. ? Swallowing. ? Using your arms, hands, or legs.  You feel generally weak.  You are not thinking clearly, or you have trouble forming sentences. A friend or family member may notice this.  You have: ? Chest pain. ? Pain in your belly (abdomen). ? Shortness of breath. ? Sweating.  Your vision changes.  You are bleeding.  You have a very bad headache.  You have neck pain or a stiff neck.  You have a fever. These symptoms may be an emergency. Do not wait to see if the symptoms will go away. Get medical help right away. Call your local  emergency services (911 in the [place] Do not drive yourself to the hospital. Summary  Dizziness makes you feel unsteady or light-headed. You may feel like you are about to pass out (faint).  Drink enough fluid to keep your pee (urine) clear or pale yellow. Do not drink alcohol.  Avoid making quick movements if you feel dizzy.  Watch your dizziness for any changes. This information is not intended to replace advice given to you by your health care provider. Make sure you discuss any questions you have with your health care provider. Document Revised: 02/25/2017 Document Reviewed: 03/11/2016 Elsevier Patient Education  [employer].   If you are age 63 or older, your body mass index should be between 23-30. Your Body mass index is Body mass index is 32.45 kg/m.Marland Kitchen If this is above the aforementioned range listed, you are consider obese by medical definition and standards. Routine daily exercise and dietary modifications are encouraged. If you would like additional guidance on weight loss, please make an appointment for weight loss counseling - must be an appointment dedicate to weight loss counseling alone. I would be happy to help you.   If you are age 60 or younger, your body mass index should be between 19-25. Your Body mass index is Body mass index is 32.45 kg/m. Marland Kitchen If this is above the aformentioned range listed, you are consider obese by medical definition and standards. Routine daily exercise and dietary modifications are encouraged. If you would like additional guidance on weight loss, please make an appointment for weight loss counseling - must be an appointment dedicate to weight loss counseling alone. I would be happy to help you.

## 2020-02-15 NOTE — Progress Notes (Signed)
This visit occurred during the SARS-CoV-2 public health emergency.  Safety protocols were in place, including screening questions prior to the visit, additional usage of staff PPE, and extensive cleaning of exam room while observing appropriate contact time as indicated for disinfecting solutions.    [place] , 06/11/1970, 49 y.o., female MRN: [id number] Patient Care Team    Relationship Specialty Notifications Start End  Emily Hillock, DO PCP - General Family Medicine  12/10/15   Emily Mclean, Emily Raspberry, MD Consulting Physician Gastroenterology  11/15/18   Emily Mclean, DPM Consulting Physician Podiatry  03/28/19   Emily Mclean, [name]    11/02/19   Emily Anis, MD Referring Physician Allergy  02/15/20     Chief Complaint  Patient presents with  . Dizziness    Pt c/o having dizzy spells when switching positions x 5 days     Subjective: Pt presents for an OV with complaints of dizziness  of 5 days duration.  Symptoms worsen with change in position from laying/standing etc. She reports she feels cloudy during these events, like before passing out. She denies room spinning or moving sensation.She reports no fever or chills. She did feel as if her faced got flushed once with dizziness. She denies palpitations. She states she is not short of breath, but when event occurs she feels like she needs to take a deep breath. She has had an increase her GERD symptoms over the last couple of days as well. She had to restart her protonix. She has had no other medication changes. She is compliant with allergy regimen on med list. She denies nausea, vomit, bowel changes, BPR, blood loss, nsaid use or alcohol  use.   Depression screen [facility] 2/9 02/15/2020 11/02/2019  Decreased Interest 0 0  Down, Depressed, Hopeless 0 0  PHQ - 2 Score 0 0    Allergies  Allergen Reactions  . Cefuroxime Axetil     REACTION: red rash  . Cephalexin     REACTION: red rash  .  Sulfamethoxazole-Trimethoprim    Social History   Social History Narrative   Live w/husband "Emily Mclean" & 3 kids Emily Mclean, Emily Mclean, Emily Mclean).    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    Smokes daily. Rare use of alcohol, no drugs.    Drinks some caffeine, uses herbal remedies.   Wears her seatbelt, bicycle helmet, smoke detector in the home.    Exercises routinely.    Feels safe in her relationships.    Past Medical History:  Diagnosis Date  . Allergic rhinitis   . Allergy   . Chronic allergic conjunctivitis   . Generalized abdominal pain 11/08/2018  . Hallux valgus (acquired), left foot 03/28/2019  . Heart murmur   . Urticaria due to cold and heat    Past Surgical History:  Procedure Laterality Date  . NASAL SINUS SURGERY     polyps removed   Family History  Problem Relation Age of Onset  . Diabetes Mother   . Kidney disease Mother   . Depression Mother   . Allergic rhinitis Mother   . Diabetes Father   . Hypertension Father   . Heart disease Father   . Diabetes Brother   . Hypertension Brother   . Kidney disease Maternal Aunt   . Depression Maternal Grandfather        suicide  . Cancer Maternal Grandfather        lymphoma   Allergies as of 02/15/2020  Reactions   Cefuroxime Axetil    REACTION: red rash   Cephalexin    REACTION: red rash   Sulfamethoxazole-trimethoprim       Medication List       Accurate as of February 15, 2020 12:20 PM. If you have any questions, ask your nurse or doctor.        STOP taking these medications   cyclobenzaprine 10 MG tablet Commonly known as: FLEXERIL Stopped by: Emily Pouch, DO   fexofenadine 180 MG tablet Commonly known as: ALLEGRA Stopped by: Emily Pouch, DO     TAKE these medications   azelastine 0.05 % ophthalmic solution Commonly known as: OPTIVAR Place 1 drop into both eyes as needed.   EPINEPHrine 0.3 mg/0.3 mL Soaj injection Commonly known as: EPI-PEN   fluticasone 50 MCG/ACT nasal  spray Commonly known as: FLONASE Place 1 spray into both nostrils daily.   levocetirizine 5 MG tablet Commonly known as: XYZAL Take 5 mg by mouth at bedtime.   montelukast 10 MG tablet Commonly known as: SINGULAIR Take 10 mg by mouth at bedtime.   pantoprazole 40 MG tablet Commonly known as: PROTONIX TAKE 1 TABLET BY MOUTH EVERY DAY What changed:   how much to take  how to take this  when to take this  reasons to take this   Vitamin D 50 MCG (2000 UT) Caps Take by mouth.       All past medical history, surgical history, allergies, family history, immunizations andmedications were updated in the EMR today and reviewed under the history and medication portions of their EMR.     ROS: Negative, with the exception of above mentioned in HPI   Objective:  BP 129/83 (Patient Position: Standing) Comment: dizzy sit to stand  Pulse 93   Temp 98.1 F (36.7 C) (Oral)   Ht 5\' 5"  (1.651 m)   Wt 195 lb (88.5 kg)   SpO2 97%   BMI 32.45 kg/m  Body mass index is 32.45 kg/m. Gen: Afebrile. No acute distress. Nontoxic in appearance, well developed, well nourished. Pleasant female.  HENT: AT. [place]. Bilateral TM visualized with no erythema and bilateral clear effusions present- mild/mod bulging. MMM, no oral lesions. Bilateral nares w/ mild erythema & drainage. Throat without erythema or exudates. No cough. No shortness of breath. No hoarseness.  Eyes:Pupils Equal Round Reactive to light, Extraocular movements intact,  Conjunctiva without redness, discharge or icterus. Neck/lymp/endocrine: Supple,mild left ant cervical LAD.   CV: RRR, no murmur, no edema Chest: CTAB, no wheeze or crackles. Good air movement, normal resp effort.  Abd: Soft. NTND. BS presnet. no Masses palpated. No rebound or guarding.  Skin: no rashes, purpura or petechiae.  Neuro: Normal gait. PERLA. EOMi. Alert. Oriented x3 Psych: Normal affect, dress and demeanor. Normal speech. Normal thought content and  judgment.  No exam data present No results found. No results found for this or any previous visit (from the past 24 hour(s)).  Assessment/Plan: Emily Mclean is a 49 y.o. female present for OV for  Dizziness/Orthostatic dizziness Dizziness was reproducible today with laying flat. HR Increase by 18 bpm on standing. BP stable.  Rest. Hydrate > 90 ounces a day.possibly related increase in gastritis type sx she is experiencing. R/o anemia w/ cbc.  - etiology poss. Sinus related> she has mild drainage and bilateral ears with fluid.    - continue allergy regimen, which includes flonase. After labs result> consider tx of sinusitis if no other cause identified and adequate hydration does  not improve symptoms.  - CBC w/Diff - Basic Metabolic Panel (BMET) Pt will be called with lab results and plan.     Reviewed expectations re: course of current medical issues.  Discussed self-management of symptoms.  Outlined signs and symptoms indicating need for more acute intervention.  Patient verbalized understanding and all questions were answered.  Patient received an After-Visit Summary.    Orders Placed This Encounter  Procedures  . CBC w/Diff  . Basic Metabolic Panel (BMET)   No orders of the defined types were placed in this encounter.  Referral Orders  No referral(s) requested today     Note is dictated utilizing voice recognition software. Although note has been proof read prior to signing, occasional typographical errors still can be missed. If any questions arise, please do not hesitate to call for verification.   electronically signed by:  Emily Pouch, DO  [place]

## 2020-02-15 NOTE — Telephone Encounter (Signed)
LVM for pt to CB regarding results.  

## 2020-02-15 NOTE — Telephone Encounter (Signed)
Please call patient She has no signs of anemia or blood loss by her labs.  Her white count overall is stable as well. Kidney function and electrolytes are normal.  We will treat as a possible sinus infection since she did have mild signs and symptoms of that on exam.  She is having drainage down her throat/stomach, this can also cause the irritation of the stomach.  Rest.  Hydrate.  Continue Flonase nasal spray and Xyzal.  Start doxycycline twice daily x10 days with a small amount of food.  Avoid calcium intake with the medication prescribed.  If she is not seeing improvement after completion of antibiotics, then follow-up at that time.

## 2020-02-16 MED ORDER — FLUCONAZOLE 150 MG PO TABS: 150.0000 mg | ORAL_TABLET | Freq: Once | ORAL | 0 refills | Status: AC

## 2020-02-16 NOTE — Telephone Encounter (Signed)
Spoke with pt regarding labs and instructions. Pt will like to have antiyeast rx with abx.

## 2020-02-16 NOTE — Addendum Note (Signed)
Addended by: Howard Pouch A on: 02/16/2020 11:23 AM   Modules accepted: Orders

## 2020-06-06 DIAGNOSIS — J019 Acute sinusitis, unspecified: Secondary | ICD-10-CM | POA: Diagnosis not present

## 2020-07-14 DIAGNOSIS — E039 Hypothyroidism, unspecified: Secondary | ICD-10-CM | POA: Diagnosis not present

## 2020-07-14 DIAGNOSIS — N951 Menopausal and female climacteric states: Secondary | ICD-10-CM | POA: Diagnosis not present

## 2020-07-16 DIAGNOSIS — N951 Menopausal and female climacteric states: Secondary | ICD-10-CM | POA: Diagnosis not present

## 2020-07-16 DIAGNOSIS — R6882 Decreased libido: Secondary | ICD-10-CM | POA: Diagnosis not present

## 2020-07-16 DIAGNOSIS — R5383 Other fatigue: Secondary | ICD-10-CM | POA: Diagnosis not present

## 2020-07-22 IMAGING — DX DG CHEST 2V
2 series · 2 of 2 positions shown · non-contrast
Comparison: 01/01/2013

CLINICAL DATA: Epigastric pain

EXAM:
CHEST - 2 VIEW

[chest pa]
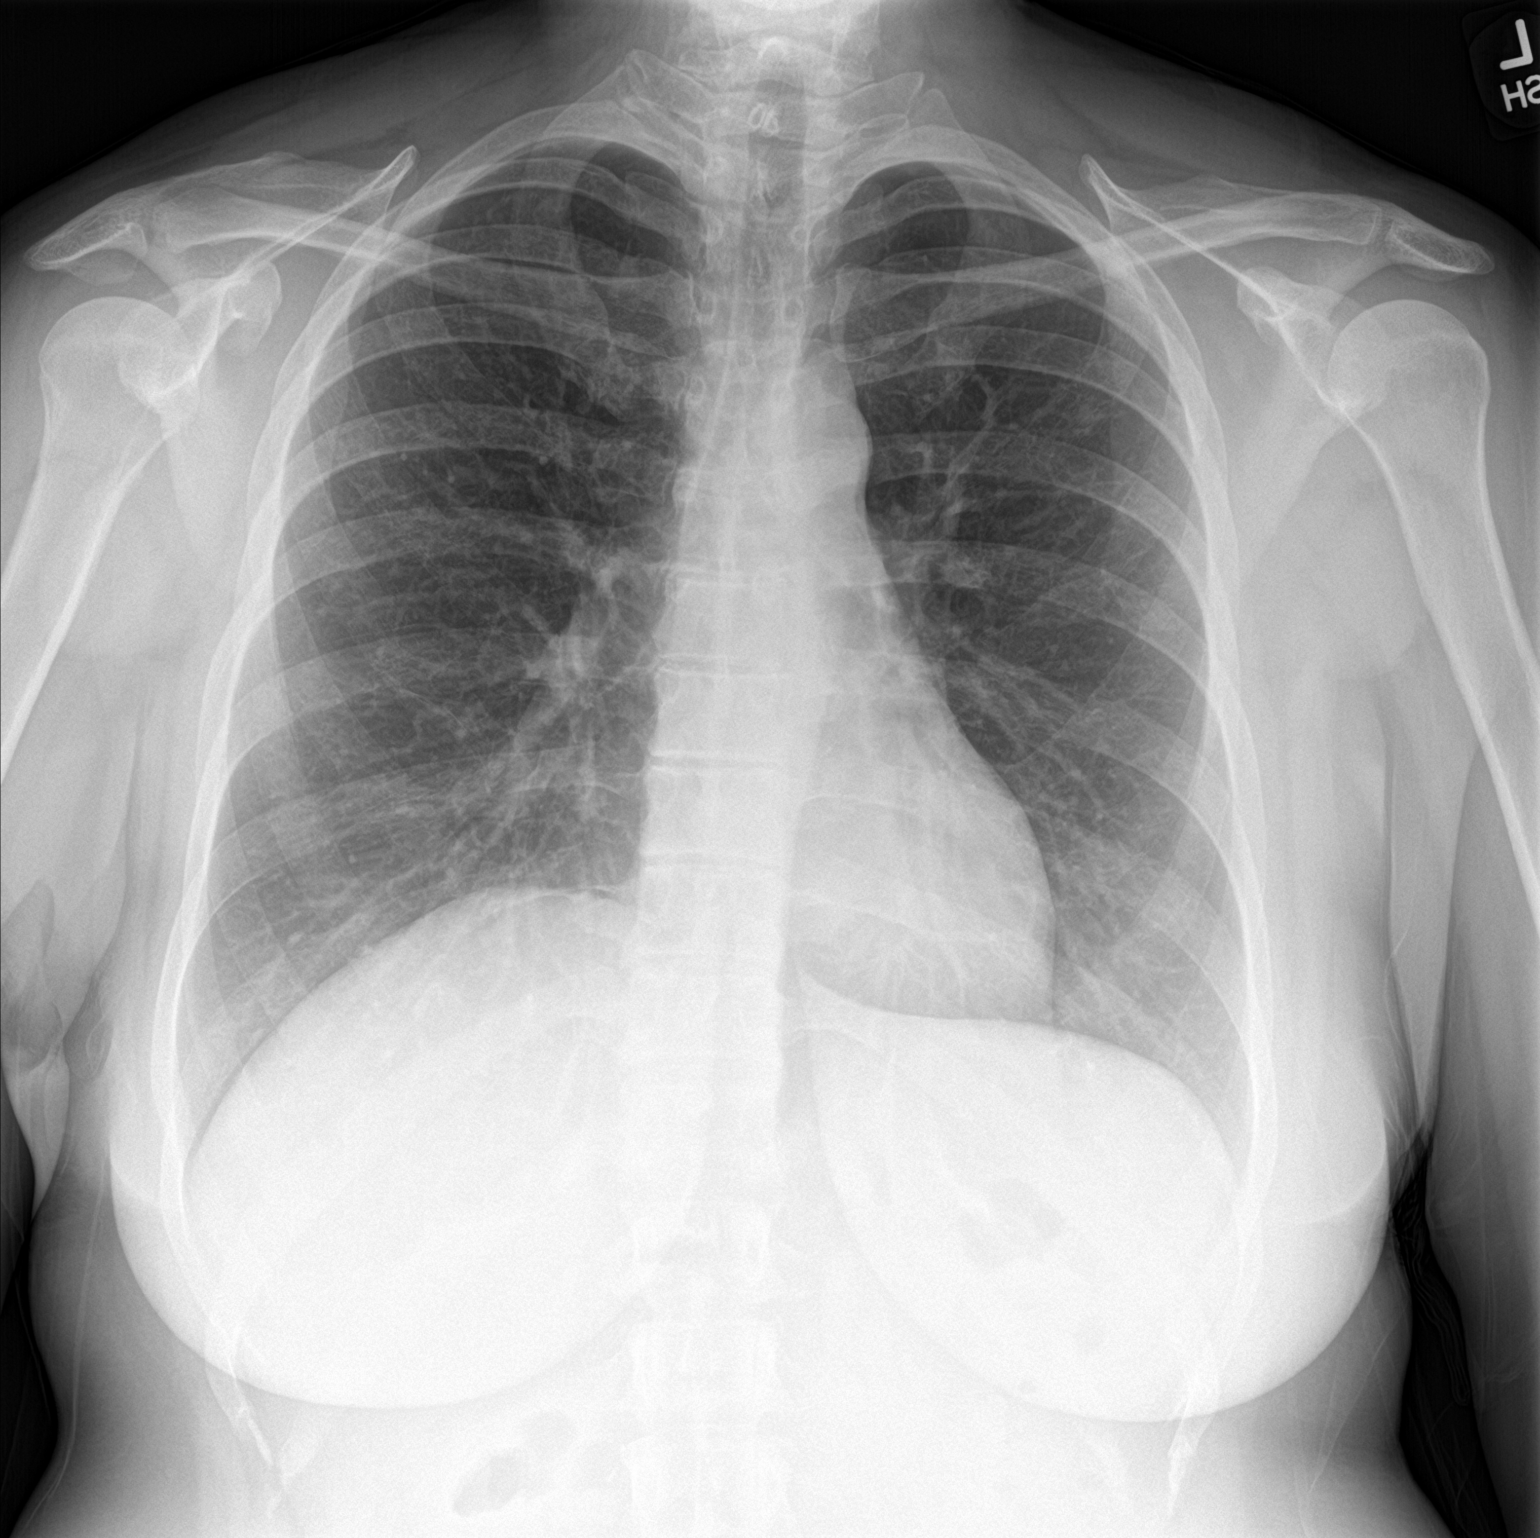

[chest lat]
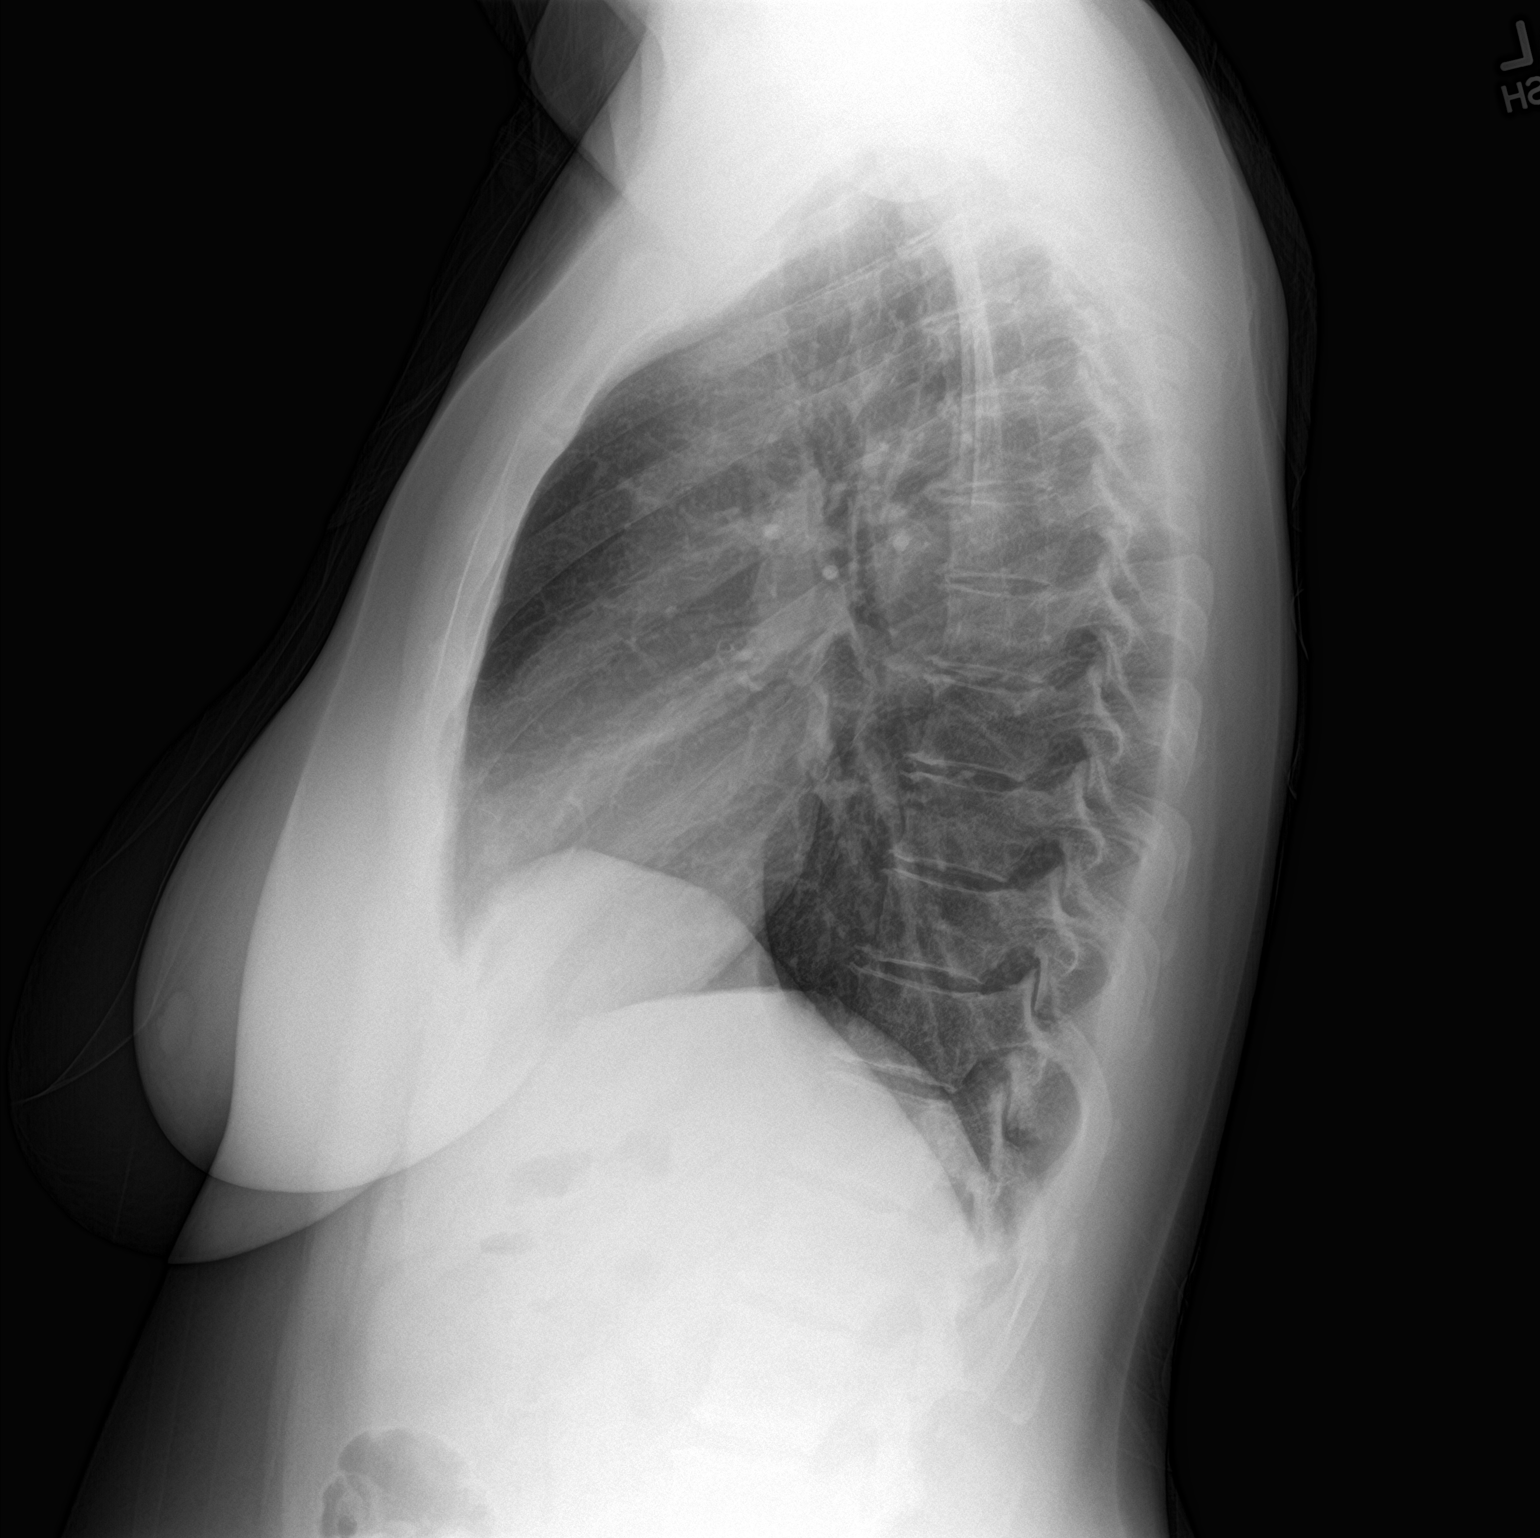

[2 of 2 positions shown; findings below may reference images not displayed]

FINDINGS: The heart size and mediastinal contours are within normal limits.
Both lungs are clear. Minimal degenerative changes of the spine.
IMPRESSION: No active cardiopulmonary disease.

## 2020-10-06 DIAGNOSIS — E039 Hypothyroidism, unspecified: Secondary | ICD-10-CM | POA: Diagnosis not present

## 2020-10-06 DIAGNOSIS — N951 Menopausal and female climacteric states: Secondary | ICD-10-CM | POA: Diagnosis not present

## 2020-10-08 DIAGNOSIS — R6882 Decreased libido: Secondary | ICD-10-CM | POA: Diagnosis not present

## 2020-10-08 DIAGNOSIS — E039 Hypothyroidism, unspecified: Secondary | ICD-10-CM | POA: Diagnosis not present

## 2020-10-08 DIAGNOSIS — Z6829 Body mass index (BMI) 29.0-29.9, adult: Secondary | ICD-10-CM | POA: Diagnosis not present

## 2020-10-08 DIAGNOSIS — N951 Menopausal and female climacteric states: Secondary | ICD-10-CM | POA: Diagnosis not present

## 2020-11-17 DIAGNOSIS — E039 Hypothyroidism, unspecified: Secondary | ICD-10-CM | POA: Diagnosis not present

## 2020-11-17 DIAGNOSIS — N951 Menopausal and female climacteric states: Secondary | ICD-10-CM | POA: Diagnosis not present

## 2020-11-19 DIAGNOSIS — E039 Hypothyroidism, unspecified: Secondary | ICD-10-CM | POA: Diagnosis not present

## 2021-01-02 DIAGNOSIS — Z23 Encounter for immunization: Secondary | ICD-10-CM | POA: Diagnosis not present

## 2021-01-19 DIAGNOSIS — H1045 Other chronic allergic conjunctivitis: Secondary | ICD-10-CM | POA: Diagnosis not present

## 2021-01-19 DIAGNOSIS — E039 Hypothyroidism, unspecified: Secondary | ICD-10-CM | POA: Diagnosis not present

## 2021-01-19 DIAGNOSIS — N951 Menopausal and female climacteric states: Secondary | ICD-10-CM | POA: Diagnosis not present

## 2021-01-19 DIAGNOSIS — J3089 Other allergic rhinitis: Secondary | ICD-10-CM | POA: Diagnosis not present

## 2021-01-19 DIAGNOSIS — L502 Urticaria due to cold and heat: Secondary | ICD-10-CM | POA: Diagnosis not present

## 2021-01-19 DIAGNOSIS — J301 Allergic rhinitis due to pollen: Secondary | ICD-10-CM | POA: Diagnosis not present

## 2021-01-22 DIAGNOSIS — E039 Hypothyroidism, unspecified: Secondary | ICD-10-CM | POA: Diagnosis not present

## 2021-01-22 DIAGNOSIS — R6882 Decreased libido: Secondary | ICD-10-CM | POA: Diagnosis not present

## 2021-01-22 DIAGNOSIS — N951 Menopausal and female climacteric states: Secondary | ICD-10-CM | POA: Diagnosis not present

## 2021-01-22 DIAGNOSIS — Z6830 Body mass index (BMI) 30.0-30.9, adult: Secondary | ICD-10-CM | POA: Diagnosis not present

## 2021-02-03 DIAGNOSIS — Z1231 Encounter for screening mammogram for malignant neoplasm of breast: Secondary | ICD-10-CM | POA: Diagnosis not present

## 2021-02-03 DIAGNOSIS — Z01419 Encounter for gynecological examination (general) (routine) without abnormal findings: Secondary | ICD-10-CM | POA: Diagnosis not present

## 2021-02-03 DIAGNOSIS — Z6830 Body mass index (BMI) 30.0-30.9, adult: Secondary | ICD-10-CM | POA: Diagnosis not present

## 2021-03-27 ENCOUNTER — Other Ambulatory Visit: Payer: Self-pay

## 2021-03-27 ENCOUNTER — Ambulatory Visit (INDEPENDENT_AMBULATORY_CARE_PROVIDER_SITE_OTHER): Payer: BC Managed Care – PPO | Admitting: Family Medicine

## 2021-03-27 ENCOUNTER — Encounter: Payer: Self-pay | Admitting: Family Medicine

## 2021-03-27 VITALS — BP 121/79 | HR 82 | Temp 98.3°F | Ht 64.57 in | Wt 180.6 lb

## 2021-03-27 DIAGNOSIS — K219 Gastro-esophageal reflux disease without esophagitis: Secondary | ICD-10-CM | POA: Insufficient documentation

## 2021-03-27 DIAGNOSIS — E669 Obesity, unspecified: Secondary | ICD-10-CM | POA: Diagnosis not present

## 2021-03-27 DIAGNOSIS — Z131 Encounter for screening for diabetes mellitus: Secondary | ICD-10-CM | POA: Diagnosis not present

## 2021-03-27 DIAGNOSIS — E034 Atrophy of thyroid (acquired): Secondary | ICD-10-CM

## 2021-03-27 DIAGNOSIS — Z130 Encounter for screening for diseases of the blood and blood-forming organs and certain disorders involving the immune mechanism: Secondary | ICD-10-CM | POA: Diagnosis not present

## 2021-03-27 DIAGNOSIS — Z0000 Encounter for general adult medical examination without abnormal findings: Secondary | ICD-10-CM | POA: Diagnosis not present

## 2021-03-27 DIAGNOSIS — Z1211 Encounter for screening for malignant neoplasm of colon: Secondary | ICD-10-CM

## 2021-03-27 MED ORDER — PANTOPRAZOLE SODIUM 40 MG PO TBEC: 40.0000 mg | DELAYED_RELEASE_TABLET | Freq: Every day | ORAL | 1 refills | Status: DC

## 2021-03-27 NOTE — Progress Notes (Signed)
This visit occurred during the SARS-CoV-2 public health emergency.  Safety protocols were in place, including screening questions prior to the visit, additional usage of staff PPE, and extensive cleaning of exam room while observing appropriate contact time as indicated for disinfecting solutions.    Patient ID: Emily Mclean, female  DOB: June 10, 1970, 51 y.o.   MRN: [id number] Patient Care Team    Relationship Specialty Notifications Start End  Ma Hillock, DO PCP - General Family Medicine  12/10/15   Armbruster, Carlota Raspberry, MD Consulting Physician Gastroenterology  11/15/18   Landis Martins, DPM Consulting Physician Podiatry  03/28/19   Lady Gary, [name]    11/02/19   Mosetta Anis, MD Referring Physician Allergy  02/15/20   Louretta Shorten, MD Consulting Physician Obstetrics and Gynecology  03/27/21   [facility] Consulting Physician   03/27/21    Comment: thyroid    Chief Complaint  Patient presents with   Annual Exam    Subjective: Emily Mclean is a 51 y.o.  Female  present for CPE. All past medical history, surgical history, allergies, family history, immunizations, medications and social history were updated in the electronic medical record today. All recent labs, ED visits and hospitalizations within the last year were reviewed.  Health maintenance:  Colonoscopy: No family history.  referred to GI today. Mammogram: No family history of breast cancer.  Mammogram completed at gynecology. Records requested. Cervical cancer screening: last pap: 11/23/2018, completed by: [facility] Etter Sjogren.  Immunizations: tdap UTD 2021, Influenza UTD 2022 (encouraged yearly), Covid series completed. Infectious disease screening: HIV completed , Hep C completed DEXA: Routine screening at age 44-65 Assistive device: none Oxygen KDT:OIZT Patient has a Dental home. Hospitalizations/ED visits: reviewed  Depression screen [facility] 2/9 03/27/2021 02/15/2020 11/02/2019   Decreased Interest 0 0 0  Down, Depressed, Hopeless 1 0 0  PHQ - 2 Score 1 0 0  Altered sleeping 1 - -  Tired, decreased energy 0 - -  Change in appetite 0 - -  Feeling bad or failure about yourself  1 - -  Trouble concentrating 0 - -  Moving slowly or fidgety/restless 0 - -  Suicidal thoughts 0 - -  PHQ-9 Score 3 - -   GAD 7 : Generalized Anxiety Score 03/27/2021  Nervous, Anxious, on Edge 0  Control/stop worrying 0  Worry too much - different things 1  Trouble relaxing 1  Restless 0  Easily annoyed or irritable 1  Afraid - awful might happen 0  Total GAD 7 Score 3     Immunization History  Administered Date(s) Administered   Influenza Inj Mdck Quad Pf 12/23/2017, 12/24/2017   Influenza Split 12/07/2012   Influenza Whole 03/09/2007, 01/07/2008   Influenza, High Dose Seasonal PF 01/09/2016, 04/30/2016, 01/22/2019, 01/16/2020, 01/19/2021   Influenza, Seasonal, Injecte, Preservative Fre 01/06/2015   Influenza,inj,Quad PF,6+ Mos 12/22/2015, 01/05/2017   Influenza-Unspecified 01/11/2014, 12/05/2015   PFIZER(Purple Top)SARS-COV-2 Vaccination 05/24/2019, 06/18/2019, 01/16/2020   Td 03/08/2006   Tdap 11/02/2019   Unspecified SARS-COV-2 Vaccination 05/24/2019, 06/18/2019, 01/02/2021    Past Medical History:  Diagnosis Date   Allergic rhinitis    Allergy    Chronic allergic conjunctivitis    Generalized abdominal pain 11/08/2018   Hallux valgus (acquired), left foot 03/28/2019   Heart murmur    Urticaria due to cold and heat    Allergies  Allergen Reactions   Cefuroxime Axetil     REACTION: red rash   Cephalexin  REACTION: red rash   Sulfamethoxazole-Trimethoprim    Past Surgical History:  Procedure Laterality Date   NASAL SINUS SURGERY     polyps removed   Family History  Problem Relation Age of Onset   Diabetes Mother    Kidney disease Mother    Depression Mother    Allergic rhinitis Mother    Diabetes Father    Hypertension Father    Heart disease  Father    Diabetes Brother    Hypertension Brother    Kidney disease Maternal Aunt    Depression Maternal Grandfather        suicide   Cancer Maternal Grandfather        lymphoma   Social History   Social History Narrative   Live w/husband "Community education officer" & 3 kids [name], [place], [place]).    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    Smokes daily. Rare use of alcohol, no drugs.    Drinks some caffeine, uses herbal remedies.   Wears her seatbelt, bicycle helmet, smoke detector in the home.    Exercises routinely.    Feels safe in her relationships.     Allergies as of 03/27/2021       Reactions   Cefuroxime Axetil    REACTION: red rash   Cephalexin    REACTION: red rash   Sulfamethoxazole-trimethoprim         Medication List        Accurate as of March 27, 2021  2:33 PM. If you have any questions, ask your nurse or doctor.          STOP taking these medications    doxycycline 100 MG tablet Commonly known as: VIBRA-TABS Stopped by: Howard Pouch, DO   EPINEPHrine 0.3 mg/0.3 mL Soaj injection Commonly known as: EPI-PEN Stopped by: Howard Pouch, DO       TAKE these medications    Armour Thyroid 60 MG tablet Generic drug: thyroid Take 60 mg by mouth daily.   azelastine 0.05 % ophthalmic solution Commonly known as: OPTIVAR Place 1 drop into both eyes as needed.   fluticasone 50 MCG/ACT nasal spray Commonly known as: FLONASE Place 1 spray into both nostrils daily.   levocetirizine 5 MG tablet Commonly known as: XYZAL Take 5 mg by mouth at bedtime.   liothyronine 5 MCG tablet Commonly known as: CYTOMEL Take by mouth.   montelukast 10 MG tablet Commonly known as: SINGULAIR Take 10 mg by mouth at bedtime.   pantoprazole 40 MG tablet Commonly known as: PROTONIX Take 1 tablet (40 mg total) by mouth daily. What changed:  how much to take how to take this when to take this reasons to take this   Vitamin D 50 MCG (2000 UT) Caps Take by mouth.         All past medical history, surgical history, allergies, family history, immunizations andmedications were updated in the EMR today and reviewed under the history and medication portions of their EMR.     No results found for this or any previous visit (from the past 2160 hour(s)).    ROS 14 pt review of systems performed and negative (unless mentioned in an HPI)  Objective:  BP 121/79    Pulse 82    Temp 98.3 F (36.8 C)    Ht 5' 4.57" (1.64 m)    Wt 180 lb 9.6 oz (81.9 kg)    LMP  (LMP Unknown)    SpO2 98%    BMI 30.46 kg/m  Physical Exam  Vitals and nursing note reviewed.  Constitutional:      General: She is not in acute distress.    Appearance: Normal appearance. She is not ill-appearing or toxic-appearing.  HENT:     Head: Normocephalic and atraumatic.     Right Ear: Tympanic membrane, ear canal and external ear normal. There is no impacted cerumen.     Left Ear: Tympanic membrane, ear canal and external ear normal. There is no impacted cerumen.     Nose: No congestion or rhinorrhea.     Mouth/Throat:     Mouth: Mucous membranes are moist.     Pharynx: Oropharynx is clear. No oropharyngeal exudate or posterior oropharyngeal erythema.  Eyes:     General: No scleral icterus.       Right eye: No discharge.        Left eye: No discharge.     Extraocular Movements: Extraocular movements intact.     Conjunctiva/sclera: Conjunctivae normal.     Pupils: Pupils are equal, round, and reactive to light.  Cardiovascular:     Rate and Rhythm: Normal rate and regular rhythm.     Pulses: Normal pulses.     Heart sounds: Normal heart sounds. No murmur heard.   No friction rub. No gallop.  Pulmonary:     Effort: Pulmonary effort is normal. No respiratory distress.     Breath sounds: Normal breath sounds. No stridor. No wheezing, rhonchi or rales.  Chest:     Chest wall: No tenderness.  Abdominal:     General: Abdomen is flat. Bowel sounds are normal. There is no distension.      Palpations: Abdomen is soft. There is no mass.     Tenderness: There is no abdominal tenderness. There is no right CVA tenderness, left CVA tenderness, guarding or rebound.     Hernia: No hernia is present.  Musculoskeletal:        General: No swelling, tenderness or deformity. Normal range of motion.     Cervical back: Normal range of motion and neck supple. No rigidity or tenderness.     Right lower leg: No edema.     Left lower leg: No edema.  Lymphadenopathy:     Cervical: No cervical adenopathy.  Skin:    General: Skin is warm and dry.     Coloration: Skin is not jaundiced or pale.     Findings: No bruising, erythema, lesion or rash.  Neurological:     General: No focal deficit present.     Mental Status: She is alert and oriented to person, place, and time. Mental status is at baseline.     Cranial Nerves: No cranial nerve deficit.     Sensory: No sensory deficit.     Motor: No weakness.     Coordination: Coordination normal.     Gait: Gait normal.     Deep Tendon Reflexes: Reflexes normal.  Psychiatric:        Mood and Affect: Mood normal.        Behavior: Behavior normal.        Thought Content: Thought content normal.        Judgment: Judgment normal.     No results found.  Assessment/plan: [place] P Post[place]ite is a 51 y.o. female present for CPE  Obesity (BMI 30-39.9) - Lipid panel Diabetes mellitus screening - Comprehensive metabolic panel - Hemoglobin A1c Screening for deficiency anemia - CBC with Differential/Platelet Colon cancer screening - Ambulatory referral to Gastroenterology Gastroesophageal reflux disease, unspecified whether  esophagitis present Refilled  Hypothyroidism due to acquired atrophy of thyroid [place]  Routine general medical examination at a health care facility Colonoscopy: No family history.  referred to GI today. Mammogram: No family history of breast cancer.  Mammogram completed at gynecology. Records requested. Cervical  cancer screening: last pap: 11/23/2018, completed by: [facility] Dr. Etter Sjogren.  Immunizations: tdap UTD 2021, Influenza UTD 2022 (encouraged yearly), Covid series completed. Infectious disease screening: HIV completed , Hep C completed DEXA: Routine screening at age 36-65 Patient was encouraged to exercise greater than 150 minutes a week. Patient was encouraged to choose a diet filled with fresh fruits and vegetables, and lean meats. AVS provided to patient today for education/recommendation on gender specific health and safety maintenance.  Return in about 1 year (around 03/28/2022) for CPE (30 min).  Orders Placed This Encounter  Procedures   CBC with Differential/Platelet   Comprehensive metabolic panel   Hemoglobin A1c   Lipid panel   Ambulatory referral to Gastroenterology   Meds ordered this encounter  Medications   pantoprazole (PROTONIX) 40 MG tablet    Sig: Take 1 tablet (40 mg total) by mouth daily.    Dispense:  30 tablet    Refill:  1   Referral Orders         Ambulatory referral to Gastroenterology      Electronically signed by: Howard Pouch, DO [place]

## 2021-03-27 NOTE — Patient Instructions (Signed)
[facility] Address:  [address], [place], [place] [address] Phone: [phone]  Great to see you today.  I have refilled the medication(s) we provide.   If labs were collected, we will inform you of lab results once received either by echart message or telephone call.   - echart message- for normal results that have been seen by the patient already.   - telephone call: abnormal results or if patient has not viewed results in their echart.  Health Maintenance, Female Adopting a healthy lifestyle and getting preventive care are important in promoting health and wellness. Ask your health care provider about: The right schedule for you to have regular tests and exams. Things you can do on your own to prevent diseases and keep yourself healthy. What should I know about diet, weight, and exercise? Eat a healthy diet  Eat a diet that includes plenty of vegetables, fruits, low-fat dairy products, and lean protein. Do not eat a lot of foods that are high in solid fats, added sugars, or sodium. Maintain a healthy weight Body mass index (BMI) is used to identify weight problems. It estimates body fat based on height and weight. Your health care provider can help determine your BMI and help you achieve or maintain a healthy weight. Get regular exercise Get regular exercise. This is one of the most important things you can do for your health. Most adults should: Exercise for at least 150 minutes each week. The exercise should increase your heart rate and make you sweat (moderate-intensity exercise). Do strengthening exercises at least twice a week. This is in addition to the moderate-intensity exercise. Spend less time sitting. Even light physical activity can be beneficial. Watch cholesterol and blood lipids Have your blood tested for lipids and cholesterol at 49 years of age, then have this test every 5 years. Have your cholesterol levels checked more often if: Your  lipid or cholesterol levels are high. You are older than 49 years of age. You are at high risk for heart disease. What should I know about cancer screening? Depending on your health history and family history, you may need to have cancer screening at various ages. This may include screening for: Breast cancer. Cervical cancer. Colorectal cancer. Skin cancer. Lung cancer. What should I know about heart disease, diabetes, and high blood pressure? Blood pressure and heart disease High blood pressure causes heart disease and increases the risk of stroke. This is more likely to develop in people who have high blood pressure readings or are overweight. Have your blood pressure checked: Every 3-5 years if you are 24-21 years of age. Every year if you are 81 years old or older. Diabetes Have regular diabetes screenings. This checks your fasting blood sugar level. Have the screening done: Once every three years after age 97 if you are at a normal weight and have a low risk for diabetes. More often and at a younger age if you are overweight or have a high risk for diabetes. What should I know about preventing infection? Hepatitis B If you have a higher risk for hepatitis B, you should be screened for this virus. Talk with your health care provider to find out if you are at risk for hepatitis B infection. Hepatitis C Testing is recommended for: Everyone born from [birth year] through 1963. Anyone with known risk factors for hepatitis C. Sexually transmitted infections (STIs) Get screened for STIs, including gonorrhea and chlamydia, if: You are sexually active and are younger than 49 years  of age. You are older than 49 years of age and your health care provider tells you that you are at risk for this type of infection. Your sexual activity has changed since you were last screened, and you are at increased risk for chlamydia or gonorrhea. Ask your health care provider if you are at risk. Ask your health  care provider about whether you are at high risk for HIV. Your health care provider may recommend a prescription medicine to help prevent HIV infection. If you choose to take medicine to prevent HIV, you should first get tested for HIV. You should then be tested every 3 months for as long as you are taking the medicine. Pregnancy If you are about to stop having your period (premenopausal) and you may become pregnant, seek counseling before you get pregnant. Take 400 to 800 micrograms (mcg) of folic acid every day if you become pregnant. Ask for birth control (contraception) if you want to prevent pregnancy. Osteoporosis and menopause Osteoporosis is a disease in which the bones lose minerals and strength with aging. This can result in bone fractures. If you are 37 years old or older, or if you are at risk for osteoporosis and fractures, ask your health care provider if you should: Be screened for bone loss. Take a calcium or vitamin D supplement to lower your risk of fractures. Be given hormone replacement therapy (HRT) to treat symptoms of menopause. Follow these instructions at home: Alcohol use Do not drink alcohol if: Your health care provider tells you not to drink. You are pregnant, may be pregnant, or are planning to become pregnant. If you drink alcohol: Limit how much you have to: 0-1 drink a day. Know how much alcohol is in your drink. In the [place] one drink equals one 12 oz bottle of beer (355 mL), one 5 oz glass of wine (148 mL), or one 1 oz glass of hard liquor (44 mL). Lifestyle Do not use any products that contain nicotine or tobacco. These products include cigarettes, chewing tobacco, and vaping devices, such as e-cigarettes. If you need help quitting, ask your health care provider. Do not use street drugs. Do not share needles. Ask your health care provider for help if you need support or information about quitting drugs. General instructions Schedule regular health,  dental, and eye exams. Stay current with your vaccines. Tell your health care provider if: You often feel depressed. You have ever been abused or do not feel safe at home. Summary Adopting a healthy lifestyle and getting preventive care are important in promoting health and wellness. Follow your health care provider's instructions about healthy diet, exercising, and getting tested or screened for diseases. Follow your health care provider's instructions on monitoring your cholesterol and blood pressure. This information is not intended to replace advice given to you by your health care provider. Make sure you discuss any questions you have with your health care provider. Document Revised: 07/14/2020 Document Reviewed: 07/14/2020 Elsevier Patient Education  [place].

## 2021-03-28 LAB — HEMOGLOBIN A1C
Hgb A1c MFr Bld: 5.2 % of total Hgb (ref <5.7)
Mean Plasma Glucose: 103 mg/dL
eAG (mmol/L): 5.7 mmol/L

## 2021-03-28 LAB — COMPREHENSIVE METABOLIC PANEL
AG Ratio: 1.8 (calc) (ref 1.0–2.5)
ALT: 18 U/L (ref 6–29)
AST: 18 U/L (ref 10–35)
Albumin: 4.1 g/dL (ref 3.6–5.1)
Alkaline phosphatase (APISO): 72 U/L (ref 31–125)
BUN: 9 mg/dL (ref 7–25)
CO2: 30 mmol/L (ref 20–32)
Calcium: 8.6 mg/dL (ref 8.6–10.2)
Chloride: 106 mmol/L (ref 98–110)
Creat: 0.66 mg/dL (ref 0.50–0.99)
Globulin: 2.3 g/dL (calc) (ref 1.9–3.7)
Glucose, Bld: 92 mg/dL (ref 65–99)
Potassium: 4.3 mmol/L (ref 3.5–5.3)
Sodium: 140 mmol/L (ref 135–146)
Total Bilirubin: 0.2 mg/dL (ref 0.2–1.2)
Total Protein: 6.4 g/dL (ref 6.1–8.1)

## 2021-03-28 LAB — CBC WITH DIFFERENTIAL/PLATELET
Absolute Monocytes: 406 cells/uL (ref 200–950)
Basophils Absolute: 63 cells/uL (ref 0–200)
Basophils Relative: 0.9 %
Eosinophils Absolute: 189 cells/uL (ref 15–500)
Eosinophils Relative: 2.7 %
HCT: 42.7 % (ref 35.0–45.0)
Hemoglobin: 14.5 g/dL (ref 11.7–15.5)
Lymphs Abs: 2646 cells/uL (ref 850–3900)
MCH: 32.2 pg (ref 27.0–33.0)
MCHC: 34 g/dL (ref 32.0–36.0)
MCV: 94.9 fL (ref 80.0–100.0)
MPV: 9.8 fL (ref 7.5–12.5)
Monocytes Relative: 5.8 %
Neutro Abs: 3696 cells/uL (ref 1500–7800)
Neutrophils Relative %: 52.8 %
Platelets: 366 10*3/uL (ref 140–400)
RBC: 4.5 10*6/uL (ref 3.80–5.10)
RDW: 11.7 % (ref 11.0–15.0)
Total Lymphocyte: 37.8 %
WBC: 7 10*3/uL (ref 3.8–10.8)

## 2021-03-28 LAB — LIPID PANEL
Cholesterol: 152 mg/dL (ref <200)
HDL: 41 mg/dL — ABNORMAL LOW (ref >=50)
LDL Cholesterol (Calc): 81 mg/dL (calc)
Non-HDL Cholesterol (Calc): 111 mg/dL (calc) (ref <130)
Total CHOL/HDL Ratio: 3.7 (calc) (ref <5.0)
Triglycerides: 206 mg/dL — ABNORMAL HIGH (ref <150)

## 2021-04-01 ENCOUNTER — Encounter: Payer: Self-pay | Admitting: Gastroenterology

## 2021-04-22 DIAGNOSIS — N951 Menopausal and female climacteric states: Secondary | ICD-10-CM | POA: Diagnosis not present

## 2021-04-22 DIAGNOSIS — E039 Hypothyroidism, unspecified: Secondary | ICD-10-CM | POA: Diagnosis not present

## 2021-04-28 ENCOUNTER — Other Ambulatory Visit: Payer: Self-pay

## 2021-04-28 ENCOUNTER — Ambulatory Visit (AMBULATORY_SURGERY_CENTER): Payer: BC Managed Care – PPO

## 2021-04-28 VITALS — Ht 65.0 in | Wt 177.0 lb

## 2021-04-28 DIAGNOSIS — Z1211 Encounter for screening for malignant neoplasm of colon: Secondary | ICD-10-CM

## 2021-04-28 DIAGNOSIS — N951 Menopausal and female climacteric states: Secondary | ICD-10-CM | POA: Diagnosis not present

## 2021-04-28 DIAGNOSIS — G479 Sleep disorder, unspecified: Secondary | ICD-10-CM | POA: Diagnosis not present

## 2021-04-28 DIAGNOSIS — R6882 Decreased libido: Secondary | ICD-10-CM | POA: Diagnosis not present

## 2021-04-28 MED ORDER — NA SULFATE-K SULFATE-MG SULF 17.5-3.13-1.6 GM/177ML PO SOLN: 1.0000 | Freq: Once | ORAL | 0 refills | Status: AC

## 2021-04-28 NOTE — Progress Notes (Signed)

## 2021-05-07 ENCOUNTER — Encounter: Payer: Self-pay | Admitting: Gastroenterology

## 2021-05-11 ENCOUNTER — Encounter: Payer: Self-pay | Admitting: Certified Registered Nurse Anesthetist

## 2021-05-12 ENCOUNTER — Encounter: Payer: Self-pay | Admitting: Gastroenterology

## 2021-05-12 ENCOUNTER — Ambulatory Visit (AMBULATORY_SURGERY_CENTER): Payer: BC Managed Care – PPO | Admitting: Gastroenterology

## 2021-05-12 ENCOUNTER — Other Ambulatory Visit: Payer: Self-pay

## 2021-05-12 VITALS — BP 118/71 | HR 66 | Temp 97.5°F | Resp 11 | Ht 65.0 in | Wt 177.0 lb

## 2021-05-12 DIAGNOSIS — Z1211 Encounter for screening for malignant neoplasm of colon: Secondary | ICD-10-CM

## 2021-05-12 DIAGNOSIS — D122 Benign neoplasm of ascending colon: Secondary | ICD-10-CM | POA: Diagnosis not present

## 2021-05-12 DIAGNOSIS — K635 Polyp of colon: Secondary | ICD-10-CM | POA: Diagnosis not present

## 2021-05-12 DIAGNOSIS — D125 Benign neoplasm of sigmoid colon: Secondary | ICD-10-CM

## 2021-05-12 MED ORDER — SODIUM CHLORIDE 0.9 % IV SOLN: 500.0000 mL | Freq: Once | INTRAVENOUS | Status: DC

## 2021-05-12 NOTE — Progress Notes (Signed)
Report given to PACU, vss 

## 2021-05-12 NOTE — Progress Notes (Signed)
Pt's states no medical or surgical changes since previsit or office visit. 

## 2021-05-12 NOTE — Progress Notes (Signed)
[facility] History and Physical ? ? ?Primary Care Physician:  Ma Hillock, DO ? ? ?Reason for Procedure:   Colon cancer screening ? ?Plan:    colonoscopy ? ? ? ? ?HPI: Emily Mclean is a 51 y.o. female  here for colonoscopy screening - first time exam. Patient denies any bowel symptoms at this time. No family history of colon cancer known. Otherwise feels well without any cardiopulmonary symptoms.  ? ? ?Past Medical History:  ?Diagnosis Date  ? Allergic rhinitis   ? Allergy   ? Chronic allergic conjunctivitis   ? Generalized abdominal pain 11/08/2018  ? GERD (gastroesophageal reflux disease)   ? Hallux valgus (acquired), left foot 03/28/2019  ? Heart murmur   ? Thyroid disease   ? Urticaria due to cold and heat   ? ? ?Past Surgical History:  ?Procedure Laterality Date  ? MANDIBLE SURGERY    ? NASAL SINUS SURGERY    ? polyps removed  ? ? ?Prior to Admission medications   ?Medication Sig Start Date End Date Taking? Authorizing Provider  ?ARMOUR THYROID 60 MG tablet Take 60 mg by mouth daily. 03/21/21  Yes [provider]  ?Cholecalciferol (VITAMIN D) 2000 UNITS CAPS Take by mouth.   Yes [provider]  ?levocetirizine (XYZAL) 5 MG tablet Take 5 mg by mouth at bedtime. 01/16/20  Yes [provider]  ?montelukast (SINGULAIR) 10 MG tablet Take 10 mg by mouth at bedtime.  01/09/16  Yes [provider]  ?albuterol (VENTOLIN HFA) 108 (90 Base) MCG/ACT inhaler albuterol sulfate HFA 90 mcg/actuation aerosol inhaler    [provider]  ?azelastine (OPTIVAR) 0.05 % ophthalmic solution Place 1 drop into both eyes as needed.  01/09/16   [provider]  ?Azelastine HCl 137 MCG/SPRAY SOLN Place into both nostrils. 04/08/21   [provider]  ?fluticasone (FLONASE) 50 MCG/ACT nasal spray Place 1 spray into both nostrils daily.  01/09/16   [provider]  ?liothyronine (CYTOMEL) 5 MCG tablet Take by mouth. ?Patient not taking: Reported on  04/28/2021 11/04/20   [provider]  ?pantoprazole (PROTONIX) 40 MG tablet Take 1 tablet (40 mg total) by mouth daily. 03/27/21   Kuneff, Renee A, DO  ? ? ?Current Outpatient Medications  ?Medication Sig Dispense Refill  ? ARMOUR THYROID 60 MG tablet Take 60 mg by mouth daily.    ? Cholecalciferol (VITAMIN D) 2000 UNITS CAPS Take by mouth.    ? levocetirizine (XYZAL) 5 MG tablet Take 5 mg by mouth at bedtime.    ? montelukast (SINGULAIR) 10 MG tablet Take 10 mg by mouth at bedtime.     ? albuterol (VENTOLIN HFA) 108 (90 Base) MCG/ACT inhaler albuterol sulfate HFA 90 mcg/actuation aerosol inhaler    ? azelastine (OPTIVAR) 0.05 % ophthalmic solution Place 1 drop into both eyes as needed.     ? Azelastine HCl 137 MCG/SPRAY SOLN Place into both nostrils.    ? fluticasone (FLONASE) 50 MCG/ACT nasal spray Place 1 spray into both nostrils daily.     ? liothyronine (CYTOMEL) 5 MCG tablet Take by mouth. (Patient not taking: Reported on 04/28/2021)    ? pantoprazole (PROTONIX) 40 MG tablet Take 1 tablet (40 mg total) by mouth daily. 30 tablet 1  ? ?Current Facility-Administered Medications  ?Medication Dose Route Frequency Provider Last Rate Last Admin  ? 0.9 %  sodium chloride infusion  500 mL Intravenous Once Samier Jaco, Carlota Raspberry, MD      ? ? ?Allergies  as of 05/12/2021 - Review Complete 05/12/2021  ?Allergen Reaction Noted  ? Cefuroxime axetil    ? Cephalexin    ? Sulfamethoxazole-trimethoprim  03/27/2019  ? ? ?Family History  ?Problem Relation Age of Onset  ? Diabetes Mother   ? Kidney disease Mother   ? Depression Mother   ? Allergic rhinitis Mother   ? Diabetes Father   ? Hypertension Father   ? Heart disease Father   ? Diabetes Brother   ? Hypertension Brother   ? Kidney disease Maternal Aunt   ? Depression Maternal Grandfather   ?     suicide  ? Cancer Maternal Grandfather   ?     lymphoma  ? Colon cancer Neg Hx   ? Colon polyps Neg Hx   ? Esophageal cancer Neg Hx   ? Stomach cancer Neg Hx   ? Rectal cancer Neg  Hx   ? ? ?Social History  ? ?Socioeconomic History  ? Marital status: Married  ?  Spouse name: [name]  ? Number of children: 3  ? Years of education: Not on file  ? Highest education level: Not on file  ?Occupational History  ? Occupation: Armed forces operational officer  ?  Comment: business owner  ?Tobacco Use  ? Smoking status: Every Day  ?  Packs/day: 0.50  ?  Years: 19.00  ?  Pack years: 9.50  ?  Types: Cigarettes  ? Smokeless tobacco: Never  ?Vaping Use  ? Vaping Use: Never used  ?Substance and Sexual Activity  ? Alcohol use: Yes  ?  Comment: occasional  ? Drug use: No  ? Sexual activity: Yes  ?  Partners: Male  ?  Birth control/protection: I.U.D.  ?  Comment: married  ?Other Topics Concern  ? Not on file  ?Social History Narrative  ? Live w/husband "[name]" & 3 kids [name], [place], [place]).   ? 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   ? Smokes daily. Rare use of alcohol, no drugs.   ? Drinks some caffeine, uses herbal remedies.  ? Wears her seatbelt, bicycle helmet, smoke detector in the home.   ? Exercises routinely.   ? Feels safe in her relationships.   ? ?Social Determinants of Health  ? ?Financial Resource Strain: Not on file  ?Food Insecurity: Not on file  ?Transportation Needs: Not on file  ?Physical Activity: Not on file  ?Stress: Not on file  ?Social Connections: Not on file  ?Intimate Partner Violence: Not on file  ? ? ?Review of Systems: ?All other review of systems negative except as mentioned in the HPI. ? ?Physical Exam: ?Vital signs ?BP 132/79   Pulse 74   Temp (!) 97.5 ?F (36.4 ?C)   Resp 15   Ht '5\' 5"'$  (1.651 m)   Wt 177 lb (80.3 kg)   SpO2 98%   BMI 29.45 kg/m?  ? ?General:   Alert,  Well-developed, pleasant and cooperative in NAD ?Lungs:  Clear throughout to auscultation.   ?Heart:  Regular rate and rhythm ?Abdomen:  Soft, nontender and nondistended.   ?Neuro/Psych:  Alert and cooperative. Normal mood and affect. A and O x 3 ? ?Jolly Mango, MD ?[facility] ? ? ?

## 2021-05-12 NOTE — Progress Notes (Signed)
Called to room to assist during endoscopic procedure.  Patient ID and intended procedure confirmed with present staff. Received instructions for my participation in the procedure from the performing physician.  

## 2021-05-12 NOTE — Patient Instructions (Signed)
Information on polyps and hemorrhoids given to you today. ? ?Await pathology results. ? ?Resume previous diet and medications. ? ? ?YOU HAD AN ENDOSCOPIC PROCEDURE TODAY AT [facility]:   Refer to the procedure report that was given to you for any specific questions about what was found during the examination.  If the procedure report does not answer your questions, please call your gastroenterologist to clarify.  If you requested that your care partner not be given the details of your procedure findings, then the procedure report has been included in a sealed envelope for you to review at your convenience later. ? ?YOU SHOULD EXPECT: Some feelings of bloating in the abdomen. Passage of more gas than usual.  Walking can help get rid of the air that was put into your GI tract during the procedure and reduce the bloating. If you had a lower endoscopy (such as a colonoscopy or flexible sigmoidoscopy) you may notice spotting of blood in your stool or on the toilet paper. If you underwent a bowel prep for your procedure, you may not have a normal bowel movement for a few days. ? ?Please Note:  You might notice some irritation and congestion in your nose or some drainage.  This is from the oxygen used during your procedure.  There is no need for concern and it should clear up in a day or so. ? ?SYMPTOMS TO REPORT IMMEDIATELY: ? ?Following lower endoscopy (colonoscopy or flexible sigmoidoscopy): ? Excessive amounts of blood in the stool ? Significant tenderness or worsening of abdominal pains ? Swelling of the abdomen that is new, acute ? Fever of 100?F or higher ? ?For urgent or emergent issues, a gastroenterologist can be reached at any hour by calling [phone]. ?Do not use MyChart messaging for urgent concerns.  ? ? ?DIET:  We do recommend a small meal at first, but then you may proceed to your regular diet.  Drink plenty of fluids but you should avoid alcoholic beverages for 24  hours. ? ?ACTIVITY:  You should plan to take it easy for the rest of today and you should NOT DRIVE or use heavy machinery until tomorrow (because of the sedation medicines used during the test).   ? ?FOLLOW UP: ?Our staff will call the number listed on your records 48-72 hours following your procedure to check on you and address any questions or concerns that you may have regarding the information given to you following your procedure. If we do not reach you, we will leave a message.  We will attempt to reach you two times.  During this call, we will ask if you have developed any symptoms of COVID 19. If you develop any symptoms (ie: fever, flu-like symptoms, shortness of breath, cough etc.) before then, please call [phone].  If you test positive for Covid 19 in the 2 weeks post procedure, please call and report this information to [place].   ? ?If any biopsies were taken you will be contacted by phone or by letter within the next 1-3 weeks.  Please call us at [phone] if you have not heard about the biopsies in 3 weeks.  ? ? ?SIGNATURES/CONFIDENTIALITY: ?You and/or your care partner have signed paperwork which will be entered into your electronic medical record.  These signatures attest to the fact that that the information above on your After Visit Summary has been reviewed and is understood.  Full responsibility of the confidentiality of this discharge information lies with you and/or your  care-partner.  ?

## 2021-05-12 NOTE — Op Note (Signed)
[place] ?Patient Name: Emily Mclean ?Procedure Date: 05/12/2021 9:18 AM ?MRN: [id number] ?Endoscopist: Carlota Raspberry. Havery Moros , MD ?Age: 51 ?Referring MD:  ?Date of Birth: 1970/09/01 ?Gender: Female ?Account #: [id number] ?Procedure:                Colonoscopy ?Indications:              Screening for colorectal malignant neoplasm, This  ?                          is the patient's first colonoscopy ?Medicines:                Monitored Anesthesia Care ?Procedure:                Pre-Anesthesia Assessment: ?                          - Prior to the procedure, a History and Physical  ?                          was performed, and patient medications and  ?                          allergies were reviewed. The patient's tolerance of  ?                          previous anesthesia was also reviewed. The risks  ?                          and benefits of the procedure and the sedation  ?                          options and risks were discussed with the patient.  ?                          All questions were answered, and informed consent  ?                          was obtained. Prior Anticoagulants: The patient has  ?                          taken no previous anticoagulant or antiplatelet  ?                          agents. ASA Grade Assessment: II - A patient with  ?                          mild systemic disease. After reviewing the risks  ?                          and benefits, the patient was deemed in  ?                          satisfactory condition to undergo the procedure. ?  After obtaining informed consent, the colonoscope  ?                          was passed under direct vision. Throughout the  ?                          procedure, the patient's blood pressure, pulse, and  ?                          oxygen saturations were monitored continuously. The  ?                          PCF-HQ190L Colonoscope was introduced through the  ?                          anus and  advanced to the the cecum, identified by  ?                          appendiceal orifice and ileocecal valve. The  ?                          colonoscopy was performed without difficulty. The  ?                          patient tolerated the procedure well. The quality  ?                          of the bowel preparation was good. The ileocecal  ?                          valve, appendiceal orifice, and rectum were  ?                          photographed. ?Scope In: 9:24:55 AM ?Scope Out: 9:37:24 AM ?Scope Withdrawal Time: 0 hours 10 minutes 8 seconds  ?Total Procedure Duration: 0 hours 12 minutes 29 seconds  ?Findings:                 The perianal and digital rectal examinations were  ?                          normal. ?                          A 4 mm polyp was found in the ascending colon. The  ?                          polyp was flat. The polyp was removed with a cold  ?                          snare. Resection and retrieval were complete. ?                          A 3 to 4 mm polyp was found in the sigmoid colon.  ?  The polyp was sessile. The polyp was removed with a  ?                          cold snare. Resection and retrieval were complete. ?                          Internal hemorrhoids were found during  ?                          retroflexion. The hemorrhoids were small. ?                          The exam was otherwise without abnormality. ?Complications:            No immediate complications. Estimated blood loss:  ?                          Minimal. ?Estimated Blood Loss:     Estimated blood loss was minimal. ?Impression:               - One 4 mm polyp in the ascending colon, removed  ?                          with a cold snare. Resected and retrieved. ?                          - One 3 to 4 mm polyp in the sigmoid colon, removed  ?                          with a cold snare. Resected and retrieved. ?                          - Internal hemorrhoids. ?                           - The examination was otherwise normal. ?Recommendation:           - Patient has a contact number available for  ?                          emergencies. The signs and symptoms of potential  ?                          delayed complications were discussed with the  ?                          patient. Return to normal activities tomorrow.  ?                          Written discharge instructions were provided to the  ?                          patient. ?                          - Resume previous  diet. ?                          - Continue present medications. ?                          - Await pathology results. ?Carlota Raspberry. Mischelle Reeg, MD ?05/12/2021 9:40:28 AM ?This report has been signed electronically. ?

## 2021-05-14 ENCOUNTER — Telehealth: Payer: Self-pay | Admitting: *Deleted

## 2021-05-14 NOTE — Telephone Encounter (Signed)
?  Follow up Call- ? ?Call back number 05/12/2021  ?Post procedure Call Back phone  # [phone]  ?Permission to leave phone message Yes  ?Some recent data might be hidden  ?  ? ?Patient questions: ? ?Do you have a fever, pain , or abdominal swelling? No. ?Pain Score  0 * ? ?Have you tolerated food without any problems? Yes.   ? ?Have you been able to return to your normal activities? Yes.   ? ?Do you have any questions about your discharge instructions: ?Diet   No. ?Medications  No. ?Follow up visit  No. ? ?Do you have questions or concerns about your Care? No. ? ?Actions: ?* If pain score is 4 or above: ?No action needed, pain <4. ? ?Have you developed a fever since your procedure? no ? ?2.   Have you had an respiratory symptoms (SOB or cough) since your procedure? no ? ?3.   Have you tested positive for COVID 19 since your procedure no ? ?4.   Have you had any family members/close contacts diagnosed with the COVID 19 since your procedure?  no ? ? ?If yes to any of these questions please route to Joylene John, RN and Joella Prince, RN ? ? ? ?

## 2021-07-27 DIAGNOSIS — N951 Menopausal and female climacteric states: Secondary | ICD-10-CM | POA: Diagnosis not present

## 2021-07-27 DIAGNOSIS — E039 Hypothyroidism, unspecified: Secondary | ICD-10-CM | POA: Diagnosis not present

## 2021-07-31 DIAGNOSIS — G479 Sleep disorder, unspecified: Secondary | ICD-10-CM | POA: Diagnosis not present

## 2021-07-31 DIAGNOSIS — Z6829 Body mass index (BMI) 29.0-29.9, adult: Secondary | ICD-10-CM | POA: Diagnosis not present

## 2021-07-31 DIAGNOSIS — N951 Menopausal and female climacteric states: Secondary | ICD-10-CM | POA: Diagnosis not present

## 2021-07-31 DIAGNOSIS — R6882 Decreased libido: Secondary | ICD-10-CM | POA: Diagnosis not present

## 2021-10-15 ENCOUNTER — Encounter: Payer: Self-pay | Admitting: Family Medicine

## 2021-11-27 DIAGNOSIS — Z23 Encounter for immunization: Secondary | ICD-10-CM | POA: Diagnosis not present

## 2022-01-20 DIAGNOSIS — L502 Urticaria due to cold and heat: Secondary | ICD-10-CM | POA: Diagnosis not present

## 2022-01-20 DIAGNOSIS — J3089 Other allergic rhinitis: Secondary | ICD-10-CM | POA: Diagnosis not present

## 2022-01-20 DIAGNOSIS — R058 Other specified cough: Secondary | ICD-10-CM | POA: Diagnosis not present

## 2022-01-20 DIAGNOSIS — H1045 Other chronic allergic conjunctivitis: Secondary | ICD-10-CM | POA: Diagnosis not present

## 2022-01-20 DIAGNOSIS — J301 Allergic rhinitis due to pollen: Secondary | ICD-10-CM | POA: Diagnosis not present

## 2022-01-29 DIAGNOSIS — Z720 Tobacco use: Secondary | ICD-10-CM | POA: Diagnosis not present

## 2022-03-30 ENCOUNTER — Encounter: Payer: Self-pay | Admitting: Family Medicine

## 2022-03-30 ENCOUNTER — Ambulatory Visit (INDEPENDENT_AMBULATORY_CARE_PROVIDER_SITE_OTHER): Payer: BC Managed Care – PPO | Admitting: Family Medicine

## 2022-03-30 VITALS — BP 114/70 | HR 70 | Temp 98.0°F | Ht 65.0 in | Wt 181.0 lb

## 2022-03-30 DIAGNOSIS — Z0000 Encounter for general adult medical examination without abnormal findings: Secondary | ICD-10-CM | POA: Diagnosis not present

## 2022-03-30 DIAGNOSIS — Z79899 Other long term (current) drug therapy: Secondary | ICD-10-CM | POA: Diagnosis not present

## 2022-03-30 DIAGNOSIS — Z01419 Encounter for gynecological examination (general) (routine) without abnormal findings: Secondary | ICD-10-CM | POA: Diagnosis not present

## 2022-03-30 DIAGNOSIS — Z131 Encounter for screening for diabetes mellitus: Secondary | ICD-10-CM

## 2022-03-30 DIAGNOSIS — Z124 Encounter for screening for malignant neoplasm of cervix: Secondary | ICD-10-CM | POA: Diagnosis not present

## 2022-03-30 DIAGNOSIS — Z130 Encounter for screening for diseases of the blood and blood-forming organs and certain disorders involving the immune mechanism: Secondary | ICD-10-CM | POA: Diagnosis not present

## 2022-03-30 DIAGNOSIS — Z1231 Encounter for screening mammogram for malignant neoplasm of breast: Secondary | ICD-10-CM | POA: Diagnosis not present

## 2022-03-30 DIAGNOSIS — E034 Atrophy of thyroid (acquired): Secondary | ICD-10-CM

## 2022-03-30 DIAGNOSIS — Z6830 Body mass index (BMI) 30.0-30.9, adult: Secondary | ICD-10-CM | POA: Diagnosis not present

## 2022-03-30 LAB — LIPID PANEL
Cholesterol: 186 mg/dL (ref 0–200)
HDL: 52.8 mg/dL (ref >39.00)
LDL Cholesterol: 110 mg/dL — ABNORMAL HIGH (ref 0–99)
NonHDL: 133.05
Total CHOL/HDL Ratio: 4
Triglycerides: 115 mg/dL (ref 0.0–149.0)
VLDL: 23 mg/dL (ref 0.0–40.0)

## 2022-03-30 LAB — CBC WITH DIFFERENTIAL/PLATELET
Basophils Absolute: 0.1 10*3/uL (ref 0.0–0.1)
Basophils Relative: 0.7 % (ref 0.0–3.0)
Eosinophils Absolute: 0.2 10*3/uL (ref 0.0–0.7)
Eosinophils Relative: 2.7 % (ref 0.0–5.0)
HCT: 43.6 % (ref 36.0–46.0)
Hemoglobin: 15 g/dL (ref 12.0–15.0)
Lymphocytes Relative: 26.7 % (ref 12.0–46.0)
Lymphs Abs: 2.4 10*3/uL (ref 0.7–4.0)
MCHC: 34.3 g/dL (ref 30.0–36.0)
MCV: 95.1 fl (ref 78.0–100.0)
Monocytes Absolute: 0.4 10*3/uL (ref 0.1–1.0)
Monocytes Relative: 4.7 % (ref 3.0–12.0)
Neutro Abs: 5.9 10*3/uL (ref 1.4–7.7)
Neutrophils Relative %: 65.2 % (ref 43.0–77.0)
Platelets: 413 10*3/uL — ABNORMAL HIGH (ref 150.0–400.0)
RBC: 4.58 Mil/uL (ref 3.87–5.11)
RDW: 12.7 % (ref 11.5–15.5)
WBC: 9.1 10*3/uL (ref 4.0–10.5)

## 2022-03-30 LAB — COMPREHENSIVE METABOLIC PANEL
ALT: 18 U/L (ref 0–35)
AST: 18 U/L (ref 0–37)
Albumin: 4.3 g/dL (ref 3.5–5.2)
Alkaline Phosphatase: 65 U/L (ref 39–117)
BUN: 11 mg/dL (ref 6–23)
CO2: 29 mEq/L (ref 19–32)
Calcium: 9.2 mg/dL (ref 8.4–10.5)
Chloride: 102 mEq/L (ref 96–112)
Creatinine, Ser: 0.7 mg/dL (ref 0.40–1.20)
GFR: 101.67 mL/min (ref >60.00)
Glucose, Bld: 87 mg/dL (ref 70–99)
Potassium: 4.4 mEq/L (ref 3.5–5.1)
Sodium: 138 mEq/L (ref 135–145)
Total Bilirubin: 0.5 mg/dL (ref 0.2–1.2)
Total Protein: 6.8 g/dL (ref 6.0–8.3)

## 2022-03-30 LAB — HM MAMMOGRAPHY

## 2022-03-30 LAB — HEMOGLOBIN A1C: Hgb A1c MFr Bld: 5.9 % (ref 4.6–6.5)

## 2022-03-30 NOTE — Patient Instructions (Signed)
No follow-ups on file.        Great to see you today.  I have refilled the medication(s) we provide.   If labs were collected, we will inform you of lab results once received either by echart message or telephone call.   - echart message- for normal results that have been seen by the patient already.   - telephone call: abnormal results or if patient has not viewed results in their echart.  

## 2022-03-30 NOTE — Progress Notes (Signed)
Patient ID: Emily Mclean, female  DOB: 1970/11/06, 52 y.o.   MRN: [id number] Patient Care Team    Relationship Specialty Notifications Start End  Ma Hillock, DO PCP - General Family Medicine  12/10/15   Armbruster, Carlota Raspberry, MD Consulting Physician Gastroenterology  11/15/18   Landis Martins, DPM Consulting Physician Podiatry  03/28/19   Lady Gary, [name]    11/02/19   Mosetta Anis, MD Referring Physician Allergy  02/15/20   Louretta Shorten, MD Consulting Physician Obstetrics and Gynecology  03/27/21   [facility] Consulting Physician   03/27/21    Comment: thyroid    Chief Complaint  Patient presents with   Annual Exam    Pt is fasting     Subjective: Emily Mclean is a 52 y.o.  Female  present for CPE.   All past medical history, surgical history, allergies, family history, immunizations, medications and social history were updated in the electronic medical record today. All recent labs, ED visits and hospitalizations within the last year were reviewed.  Health maintenance:  Colonoscopy: No family history.  Colonoscopy completed 05/12/2021-Dr. Armbruster 5-year follow-up Mammogram: No family history of breast cancer.  Mammogram completed 11/22 at gynecology.> scheduled  Cervical cancer screening: last pap: 01/2021, [facility] Etter Sjogren.  Immunizations: tdap UTD 2021, Influenza 12/2021(encouraged yearly) Infectious disease screening: HIV completed , Hep C completed DEXA: Routine screening at age 24 Assistive device: none Oxygen QMV:HQIO Patient has a Dental home. Hospitalizations/ED visits: reviewed     03/30/2022    8:42 AM 03/27/2021    2:11 PM 02/15/2020   11:31 AM 11/02/2019    8:26 AM  Depression screen PHQ 2/9  Decreased Interest 0 0 0 0  Down, Depressed, Hopeless 0 1 0 0  PHQ - 2 Score 0 1 0 0  Altered sleeping  1    Tired, decreased energy  0    Change in appetite  0    Feeling bad or failure about yourself    1    Trouble concentrating  0    Moving slowly or fidgety/restless  0    Suicidal thoughts  0    PHQ-9 Score  3        03/27/2021    2:11 PM  GAD 7 : Generalized Anxiety Score  Nervous, Anxious, on Edge 0  Control/stop worrying 0  Worry too much - different things 1  Trouble relaxing 1  Restless 0  Easily annoyed or irritable 1  Afraid - awful might happen 0  Total GAD 7 Score 3     Immunization History  Administered Date(s) Administered   Influenza Inj Mdck Quad Pf 12/23/2017, 12/24/2017   Influenza Split 12/07/2012   Influenza Whole 03/09/2007, 01/07/2008   Influenza, High Dose Seasonal PF 01/09/2016, 04/30/2016, 01/22/2019, 01/16/2020, 01/19/2021   Influenza, Seasonal, Injecte, Preservative Fre 01/06/2015   Influenza,inj,Quad PF,6+ Mos 12/22/2015, 01/05/2017   Influenza-Unspecified 01/11/2014, 12/05/2015, 12/06/2021   PFIZER(Purple Top)SARS-COV-2 Vaccination 05/24/2019, 06/18/2019, 01/16/2020   Td 03/08/2006   Tdap 11/02/2019   Unspecified SARS-COV-2 Vaccination 05/24/2019, 06/18/2019, 01/02/2021    Past Medical History:  Diagnosis Date   Allergic rhinitis    Allergy    Chronic allergic conjunctivitis    Generalized abdominal pain 11/08/2018   GERD (gastroesophageal reflux disease)    Hallux valgus (acquired), left foot 03/28/2019   Heart murmur    Thyroid disease    Urticaria due to cold and heat  Allergies  Allergen Reactions   Cefdinir Hives   Cefuroxime Axetil     REACTION: red rash   Cephalexin     REACTION: red rash   Sulfamethoxazole-Trimethoprim    Past Surgical History:  Procedure Laterality Date   MANDIBLE SURGERY     NASAL SINUS SURGERY     polyps removed   Family History  Problem Relation Age of Onset   Diabetes Mother    Kidney disease Mother    Depression Mother    Allergic rhinitis Mother    Diabetes Father    Hypertension Father    Heart disease Father    Diabetes Brother    Hypertension Brother    Kidney disease Maternal  Aunt    Depression Maternal Grandfather        suicide   Cancer Maternal Grandfather        lymphoma   Colon cancer Neg Hx    Colon polyps Neg Hx    Esophageal cancer Neg Hx    Stomach cancer Neg Hx    Rectal cancer Neg Hx    Social History   Social History Narrative   Live w/husband "Community education officer" & 3 kids [place], [place], [place]).    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    Smokes daily. Rare use of alcohol, no drugs.    Drinks some caffeine, uses herbal remedies.   Wears her seatbelt, bicycle helmet, smoke detector in the home.    Exercises routinely.    Feels safe in her relationships.     Allergies as of 03/30/2022       Reactions   Cefdinir Hives   Cefuroxime Axetil    REACTION: red rash   Cephalexin    REACTION: red rash   Sulfamethoxazole-trimethoprim         Medication List        Accurate as of March 30, 2022  9:13 AM. If you have any questions, ask your nurse or doctor.          STOP taking these medications    liothyronine 5 MCG tablet Commonly known as: CYTOMEL Stopped by: Howard Pouch, DO   pantoprazole 40 MG tablet Commonly known as: PROTONIX Stopped by: Howard Pouch, DO       TAKE these medications    albuterol 108 (90 Base) MCG/ACT inhaler Commonly known as: VENTOLIN HFA albuterol sulfate HFA 90 mcg/actuation aerosol inhaler   Armour Thyroid 60 MG tablet Generic drug: thyroid Take 60 mg by mouth daily.   azelastine 0.05 % ophthalmic solution Commonly known as: OPTIVAR Place 1 drop into both eyes as needed. What changed: Another medication with the same name was removed. Continue taking this medication, and follow the directions you see here. Changed by: Howard Pouch, DO   Azelastine HCl 137 MCG/SPRAY Soln Place into both nostrils.   fluticasone 50 MCG/ACT nasal spray Commonly known as: FLONASE Place 1 spray into both nostrils daily.   levocetirizine 5 MG tablet Commonly known as: XYZAL Take 5 mg by mouth at bedtime.    montelukast 10 MG tablet Commonly known as: SINGULAIR Take 10 mg by mouth at bedtime.   Vitamin D 50 MCG (2000 UT) Caps Take by mouth.        All past medical history, surgical history, allergies, family history, immunizations andmedications were updated in the EMR today and reviewed under the history and medication portions of their EMR.     No results found for this or any previous visit (from the past 2160  hour(s)).    ROS 14 pt review of systems performed and negative (unless mentioned in an HPI)  Objective: BP 114/70   Pulse 70   Temp 98 F (36.7 C) (Oral)   Ht '5\' 5"'$  (1.651 m)   Wt 181 lb (82.1 kg)   SpO2 99%   BMI 30.12 kg/m  Physical Exam Vitals and nursing note reviewed.  Constitutional:      General: She is not in acute distress.    Appearance: Normal appearance. She is not ill-appearing or toxic-appearing.  HENT:     Head: Normocephalic and atraumatic.     Right Ear: Tympanic membrane, ear canal and external ear normal. There is no impacted cerumen.     Left Ear: Tympanic membrane, ear canal and external ear normal. There is no impacted cerumen.     Nose: No congestion or rhinorrhea.     Mouth/Throat:     Mouth: Mucous membranes are moist.     Pharynx: Oropharynx is clear. No oropharyngeal exudate or posterior oropharyngeal erythema.  Eyes:     General: No scleral icterus.       Right eye: No discharge.        Left eye: No discharge.     Extraocular Movements: Extraocular movements intact.     Conjunctiva/sclera: Conjunctivae normal.     Pupils: Pupils are equal, round, and reactive to light.  Cardiovascular:     Rate and Rhythm: Normal rate and regular rhythm.     Pulses: Normal pulses.     Heart sounds: Normal heart sounds. No murmur heard.    No friction rub. No gallop.  Pulmonary:     Effort: Pulmonary effort is normal. No respiratory distress.     Breath sounds: Normal breath sounds. No stridor. No wheezing, rhonchi or rales.  Chest:      Chest wall: No tenderness.  Abdominal:     General: Abdomen is flat. Bowel sounds are normal. There is no distension.     Palpations: Abdomen is soft. There is no mass.     Tenderness: There is no abdominal tenderness. There is no right CVA tenderness, left CVA tenderness, guarding or rebound.     Hernia: No hernia is present.  Musculoskeletal:        General: No swelling, tenderness or deformity. Normal range of motion.     Cervical back: Normal range of motion and neck supple. No rigidity or tenderness.     Right lower leg: No edema.     Left lower leg: No edema.  Lymphadenopathy:     Cervical: No cervical adenopathy.  Skin:    General: Skin is warm and dry.     Coloration: Skin is not jaundiced or pale.     Findings: No bruising, erythema, lesion or rash.  Neurological:     General: No focal deficit present.     Mental Status: She is alert and oriented to person, place, and time. Mental status is at baseline.     Cranial Nerves: No cranial nerve deficit.     Sensory: No sensory deficit.     Motor: No weakness.     Coordination: Coordination normal.     Gait: Gait normal.     Deep Tendon Reflexes: Reflexes normal.  Psychiatric:        Mood and Affect: Mood normal.        Behavior: Behavior normal.        Thought Content: Thought content normal.  Judgment: Judgment normal.      No results found.  Assessment/plan: [place] is a 52 y.o. female present for CPE  Hypothyroidism due to acquired atrophy of thyroid [employer] med.  Breast cancer screening by mammogram Scheduled today Diabetes mellitus screening - Hemoglobin A1c Screening for deficiency anemia - CBC with Differential/Platelet Encounter for long-term current use of medication - CBC with Differential/Platelet - Comprehensive metabolic panel - Hemoglobin A1c - Lipid panel Routine general medical examination at a health care facility - CBC with Differential/Platelet -  Comprehensive metabolic panel - Hemoglobin A1c - Lipid panel Colonoscopy: No family history.  Colonoscopy completed 05/12/2021-Dr. Armbruster 5-year follow-up Mammogram: No family history of breast cancer.  Mammogram completed 11/22 at gynecology.> scheduled  Cervical cancer screening: last pap: 01/2021, completed by: [facility] Dr. Etter Sjogren.  Immunizations: tdap UTD 2021, Influenza 12/2021(encouraged yearly) Infectious disease screening: HIV completed , Hep C completed DEXA: Routine screening at age 40 Patient was encouraged to exercise greater than 150 minutes a week. Patient was encouraged to choose a diet filled with fresh fruits and vegetables, and lean meats. AVS provided to patient today for education/recommendation on gender specific health and safety maintenance.   Return in about 1 year (around 04/01/2023) for cpe (20 min).  Orders Placed This Encounter  Procedures   CBC with Differential/Platelet   Comprehensive metabolic panel   Hemoglobin A1c   Lipid panel   No orders of the defined types were placed in this encounter.  Referral Orders  No referral(s) requested today     Electronically signed by: Howard Pouch, [place]

## 2022-03-31 LAB — HM PAP SMEAR: Pap: NEGATIVE

## 2022-05-08 DIAGNOSIS — N3000 Acute cystitis without hematuria: Secondary | ICD-10-CM | POA: Diagnosis not present

## 2022-06-14 DIAGNOSIS — J310 Chronic rhinitis: Secondary | ICD-10-CM | POA: Diagnosis not present

## 2022-06-14 DIAGNOSIS — J343 Hypertrophy of nasal turbinates: Secondary | ICD-10-CM | POA: Diagnosis not present

## 2022-08-04 DIAGNOSIS — M79661 Pain in right lower leg: Secondary | ICD-10-CM | POA: Diagnosis not present

## 2022-08-04 DIAGNOSIS — M79604 Pain in right leg: Secondary | ICD-10-CM | POA: Diagnosis not present

## 2022-08-04 DIAGNOSIS — I87391 Chronic venous hypertension (idiopathic) with other complications of right lower extremity: Secondary | ICD-10-CM | POA: Diagnosis not present

## 2022-08-04 DIAGNOSIS — I87321 Chronic venous hypertension (idiopathic) with inflammation of right lower extremity: Secondary | ICD-10-CM | POA: Diagnosis not present

## 2022-08-04 DIAGNOSIS — R252 Cramp and spasm: Secondary | ICD-10-CM | POA: Diagnosis not present

## 2022-09-29 DIAGNOSIS — H04123 Dry eye syndrome of bilateral lacrimal glands: Secondary | ICD-10-CM | POA: Diagnosis not present

## 2022-11-26 DIAGNOSIS — Z23 Encounter for immunization: Secondary | ICD-10-CM | POA: Diagnosis not present

## 2022-12-28 ENCOUNTER — Ambulatory Visit
Admission: RE | Admit: 2022-12-28 | Discharge: 2022-12-28 | Disposition: A | Discharge status: Home / Self Care | Payer: BC Managed Care – PPO | Source: Ambulatory Visit | Attending: Family Medicine | Admitting: Family Medicine

## 2022-12-28 ENCOUNTER — Ambulatory Visit (INDEPENDENT_AMBULATORY_CARE_PROVIDER_SITE_OTHER): Payer: BC Managed Care – PPO

## 2022-12-28 VITALS — BP 142/83 | HR 75 | Temp 98.1°F | Resp 16 | Ht 65.0 in | Wt 180.0 lb

## 2022-12-28 DIAGNOSIS — R0781 Pleurodynia: Secondary | ICD-10-CM

## 2022-12-28 DIAGNOSIS — R079 Chest pain, unspecified: Secondary | ICD-10-CM | POA: Diagnosis not present

## 2022-12-28 MED ORDER — PREDNISONE 50 MG PO TABS: ORAL_TABLET | ORAL | 0 refills | Status: DC

## 2022-12-28 NOTE — ED Provider Notes (Signed)
[facility]    CSN: [id number] Arrival date & time: 12/28/22  0901      History   Chief Complaint Chief Complaint  Patient presents with   Back Pain    Left side pain mainly in back but front too...nauseous. - Entered by patient    HPI Emily Mclean is a 52 y.o. female.   Patient states that she has pain in her left flank area that radiates around to her abdomen.  It hurts with movement.  It hurts with deep breath.  She has had no recent cough or infection she is a smoker.  She not had accident or injury, no fall.  She has never had pain like this before.  She does not have urinary symptoms.  No shortness of breath or wheezing    Past Medical History:  Diagnosis Date   Allergic rhinitis    Allergy    Chronic allergic conjunctivitis    Generalized abdominal pain 11/08/2018   GERD (gastroesophageal reflux disease)    Hallux valgus (acquired), left foot 03/28/2019   Heart murmur    Thyroid disease    Urticaria due to cold and heat     Patient Active Problem List   Diagnosis Date Noted   GERD (gastroesophageal reflux disease) 03/27/2021   Allergic rhinitis 02/15/2020   Chronic allergic conjunctivitis 02/15/2020   Urticaria due to cold and heat 02/15/2020   Orthostatic dizziness 02/15/2020   Hypocalcemia 11/02/2019   Hypothyroidism 11/02/2019    Past Surgical History:  Procedure Laterality Date   MANDIBLE SURGERY     NASAL SINUS SURGERY     polyps removed    OB History     Gravida  3   Para  3   Term  3   Preterm      AB      Living  3      SAB      IAB      Ectopic      Multiple      Live Births               Home Medications    Prior to Admission medications   Medication Sig Start Date End Date Taking? Authorizing Provider  albuterol (VENTOLIN HFA) 108 (90 Base) MCG/ACT inhaler albuterol sulfate HFA 90 mcg/actuation aerosol inhaler   Yes [provider]  ARMOUR THYROID 60 MG tablet Take 60 mg by mouth  daily. 03/21/21  Yes [provider]  azelastine (OPTIVAR) 0.05 % ophthalmic solution Place 1 drop into both eyes as needed.  01/09/16  Yes [provider]  Azelastine HCl 137 MCG/SPRAY SOLN Place into both nostrils. 04/08/21  Yes [provider]  Cholecalciferol (VITAMIN D) 2000 UNITS CAPS Take by mouth.   Yes [provider]  fluticasone (FLONASE) 50 MCG/ACT nasal spray Place 1 spray into both nostrils daily.  01/09/16  Yes [provider]  levocetirizine (XYZAL) 5 MG tablet Take 5 mg by mouth at bedtime. 01/16/20  Yes [provider]  montelukast (SINGULAIR) 10 MG tablet Take 10 mg by mouth at bedtime.  01/09/16  Yes [provider]  predniSONE (DELTASONE) 50 MG tablet Take once a day for 5 days.  Take with food 12/28/22  Yes Eustace Moore, MD    Family History Family History  Problem Relation Age of Onset   Diabetes Mother    Kidney disease Mother    Depression Mother    Allergic rhinitis Mother  Diabetes Father    Hypertension Father    Heart disease Father    Diabetes Brother    Hypertension Brother    Kidney disease Maternal Aunt    Depression Maternal Grandfather        suicide   Cancer Maternal Grandfather        lymphoma   Colon cancer Neg Hx    Colon polyps Neg Hx    Esophageal cancer Neg Hx    Stomach cancer Neg Hx    Rectal cancer Neg Hx     Social History Social History   Tobacco Use   Smoking status: Every Day    Current packs/day: 0.50    Average packs/day: 0.5 packs/day for 19.0 years (9.5 ttl pk-yrs)    Types: Cigarettes   Smokeless tobacco: Never  Vaping Use   Vaping status: Never Used  Substance Use Topics   Alcohol use: Yes    Comment: occasional   Drug use: No     Allergies   Cefdinir, Cefuroxime axetil, Cephalexin, and Sulfamethoxazole-trimethoprim   Review of Systems Review of Systems See HPI  Physical Exam Triage Vital Signs ED Triage Vitals  Encounter Vitals Group      BP 12/28/22 0911 (!) 142/83     Systolic BP Percentile --      Diastolic BP Percentile --      Pulse Rate 12/28/22 0911 75     Resp 12/28/22 0911 16     Temp 12/28/22 0911 98.1 F (36.7 C)     Temp Source 12/28/22 0911 Oral     SpO2 12/28/22 0911 97 %     Weight 12/28/22 0912 180 lb (81.6 kg)     Height 12/28/22 0912 5\' 5"  (1.651 m)     Head Circumference --      Peak Flow --      Pain Score 12/28/22 0912 6     Pain Loc --      Pain Education --      Exclude from Growth Chart --    No data found.  Updated Vital Signs BP (!) 142/83 (BP Location: Right Arm)   Pulse 75   Temp 98.1 F (36.7 C) (Oral)   Resp 16   Ht 5\' 5"  (1.651 m)   Wt 81.6 kg   SpO2 97%   BMI 29.95 kg/m      Physical Exam Constitutional:      General: She is not in acute distress.    Appearance: She is well-developed and normal weight. She is not ill-appearing.  HENT:     Head: Normocephalic and atraumatic.  Eyes:     Conjunctiva/sclera: Conjunctivae normal.     Pupils: Pupils are equal, round, and reactive to light.  Cardiovascular:     Rate and Rhythm: Normal rate.  Pulmonary:     Effort: Pulmonary effort is normal. No respiratory distress.     Breath sounds: Wheezing present.     Comments: Few inspiratory wheeze in left base Chest:     Chest wall: No tenderness.  Abdominal:     General: There is no distension.     Palpations: Abdomen is soft.  Musculoskeletal:        General: Normal range of motion.     Cervical back: Normal range of motion.  Skin:    General: Skin is warm and dry.  Neurological:     Mental Status: She is alert.      UC Treatments / Results  Labs (all labs ordered  are listed, but only abnormal results are displayed) Labs Reviewed - No data to display  EKG   Radiology No results found.  Procedures Procedures (including critical care time)  Medications Ordered in UC Medications - No data to display  Initial Impression / Assessment and Plan / UC  Course  I have reviewed the triage vital signs and the nursing notes.  Pertinent labs & imaging results that were available during my care of the patient were reviewed by me and considered in my medical decision making (see chart for details).     X-ray is read in the absence of radiology due to delay.  It is normal to my evaluation Final Clinical Impressions(s) / UC Diagnoses   Final diagnoses:  Pleuritic chest pain     Discharge Instructions      Take the prednisone once a day for 5 days This is a strong anti-inflammatory for the pleurisy pain May take ibuprofen if needed Call or return if not better in a week   ED Prescriptions     Medication Sig Dispense Auth. Provider   predniSONE (DELTASONE) 50 MG tablet Take once a day for 5 days.  Take with food 5 tablet Eustace Moore, MD      PDMP not reviewed this encounter.   Eustace Moore, MD 12/28/22 1018

## 2022-12-28 NOTE — ED Triage Notes (Signed)
Patient c/o left sided rib pain that radiates around to the front x 3 days.  No injury.  Having some nausea, no vomiting.  Patient has taken Ibuprofen and applying a heating pad.  No history of kidney stone, denies any urinary sx's.

## 2022-12-28 NOTE — Discharge Instructions (Addendum)
Take the prednisone once a day for 5 days This is a strong anti-inflammatory for the pleurisy pain May take ibuprofen if needed Call or return if not better in a week

## 2023-01-25 DIAGNOSIS — H1045 Other chronic allergic conjunctivitis: Secondary | ICD-10-CM | POA: Diagnosis not present

## 2023-01-25 DIAGNOSIS — R058 Other specified cough: Secondary | ICD-10-CM | POA: Diagnosis not present

## 2023-01-25 DIAGNOSIS — J301 Allergic rhinitis due to pollen: Secondary | ICD-10-CM | POA: Diagnosis not present

## 2023-01-25 DIAGNOSIS — J3089 Other allergic rhinitis: Secondary | ICD-10-CM | POA: Diagnosis not present

## 2023-03-30 DIAGNOSIS — L232 Allergic contact dermatitis due to cosmetics: Secondary | ICD-10-CM | POA: Diagnosis not present

## 2023-04-12 ENCOUNTER — Ambulatory Visit (INDEPENDENT_AMBULATORY_CARE_PROVIDER_SITE_OTHER): Payer: BC Managed Care – PPO | Admitting: Family Medicine

## 2023-04-12 ENCOUNTER — Encounter: Payer: Self-pay | Admitting: Family Medicine

## 2023-04-12 VITALS — BP 124/80 | HR 74 | Temp 97.8°F | Ht 67.0 in | Wt 190.4 lb

## 2023-04-12 DIAGNOSIS — Z13220 Encounter for screening for lipoid disorders: Secondary | ICD-10-CM | POA: Diagnosis not present

## 2023-04-12 DIAGNOSIS — Z0000 Encounter for general adult medical examination without abnormal findings: Secondary | ICD-10-CM | POA: Diagnosis not present

## 2023-04-12 DIAGNOSIS — Z23 Encounter for immunization: Secondary | ICD-10-CM

## 2023-04-12 DIAGNOSIS — Z6831 Body mass index (BMI) 31.0-31.9, adult: Secondary | ICD-10-CM | POA: Diagnosis not present

## 2023-04-12 DIAGNOSIS — Z124 Encounter for screening for malignant neoplasm of cervix: Secondary | ICD-10-CM | POA: Diagnosis not present

## 2023-04-12 DIAGNOSIS — Z1231 Encounter for screening mammogram for malignant neoplasm of breast: Secondary | ICD-10-CM | POA: Diagnosis not present

## 2023-04-12 DIAGNOSIS — E034 Atrophy of thyroid (acquired): Secondary | ICD-10-CM

## 2023-04-12 DIAGNOSIS — Z131 Encounter for screening for diabetes mellitus: Secondary | ICD-10-CM | POA: Diagnosis not present

## 2023-04-12 DIAGNOSIS — Z01419 Encounter for gynecological examination (general) (routine) without abnormal findings: Secondary | ICD-10-CM | POA: Diagnosis not present

## 2023-04-12 LAB — TSH: TSH: 3.52 u[IU]/mL (ref 0.35–5.50)

## 2023-04-12 LAB — COMPREHENSIVE METABOLIC PANEL
ALT: 14 U/L (ref 0–35)
AST: 14 U/L (ref 0–37)
Albumin: 4 g/dL (ref 3.5–5.2)
Alkaline Phosphatase: 63 U/L (ref 39–117)
BUN: 10 mg/dL (ref 6–23)
CO2: 28 meq/L (ref 19–32)
Calcium: 9 mg/dL (ref 8.4–10.5)
Chloride: 104 meq/L (ref 96–112)
Creatinine, Ser: 0.68 mg/dL (ref 0.40–1.20)
GFR: 101.64 mL/min (ref >60.00)
Glucose, Bld: 91 mg/dL (ref 70–99)
Potassium: 4.5 meq/L (ref 3.5–5.1)
Sodium: 138 meq/L (ref 135–145)
Total Bilirubin: 0.6 mg/dL (ref 0.2–1.2)
Total Protein: 6.3 g/dL (ref 6.0–8.3)

## 2023-04-12 LAB — CBC
HCT: 42.3 % (ref 36.0–46.0)
Hemoglobin: 14.4 g/dL (ref 12.0–15.0)
MCHC: 34 g/dL (ref 30.0–36.0)
MCV: 96 fL (ref 78.0–100.0)
Platelets: 406 10*3/uL — ABNORMAL HIGH (ref 150.0–400.0)
RBC: 4.41 Mil/uL (ref 3.87–5.11)
RDW: 12.8 % (ref 11.5–15.5)
WBC: 9.4 10*3/uL (ref 4.0–10.5)

## 2023-04-12 LAB — HM PAP SMEAR: HM Pap smear: NORMAL

## 2023-04-12 LAB — RESULTS CONSOLE HPV: CHL HPV: NEGATIVE

## 2023-04-12 LAB — LIPID PANEL
Cholesterol: 174 mg/dL (ref 0–200)
HDL: 49.9 mg/dL (ref >39.00)
LDL Cholesterol: 102 mg/dL — ABNORMAL HIGH (ref 0–99)
NonHDL: 124.11
Total CHOL/HDL Ratio: 3
Triglycerides: 113 mg/dL (ref 0.0–149.0)
VLDL: 22.6 mg/dL (ref 0.0–40.0)

## 2023-04-12 LAB — HEMOGLOBIN A1C: Hgb A1c MFr Bld: 6.2 % (ref 4.6–6.5)

## 2023-04-12 LAB — HM MAMMOGRAPHY

## 2023-04-12 NOTE — Patient Instructions (Addendum)

## 2023-04-12 NOTE — Progress Notes (Signed)
 Patient ID: Dalton  P Lamba, female  DOB: 24-Jan-1971, 53 y.o.   MRN: [id number] Patient Care Team    Relationship Specialty Notifications Start End  Catherine Charlies LABOR, DO PCP - General Family Medicine  12/10/15   Armbruster, Elspeth SQUIBB, MD Consulting Physician Gastroenterology  11/15/18   Burt Fus, DPM Consulting Physician Podiatry  03/28/19   [name], [name]    11/02/19   [name], MD Referring Physician Allergy  02/15/20   [name], MD Consulting Physician Obstetrics and Gynecology  03/27/21   [facility] Consulting Physician   03/27/21    Comment: thyroid     Chief Complaint  Patient presents with   Annual Exam    Pt is fasting.     Subjective: Emily  P Mclean is a 53 y.o.  Female  present for CPE.   All past medical history, surgical history, allergies, family history, immunizations, medications and social history were updated in the electronic medical record today. All recent labs, ED visits and hospitalizations within the last year were reviewed.  Health maintenance:  Colonoscopy: No family history.  Colonoscopy completed 05/12/2021-Dr. Armbruster 5-year follow-up Mammogram: No family history of breast cancer.  Mammogram completed 03/30/2022 at gynecology.> scheduled-requested most recent record Cervical cancer screening: last pap: 03/30/2022, completed by: [facility]. Antonio.  Immunizations: tdap UTD 2021, Influenza UTD 2024(encouraged yearly), shingrix  #1 today, pna completed today Infectious disease screening: HIV completed , Hep C completed DEXA: Routine screening at age 4 Patient has a Dental home. Hospitalizations/ED visits: reviewed     03/30/2022    8:42 AM 03/27/2021    2:11 PM 02/15/2020   11:31 AM 11/02/2019    8:26 AM  Depression screen PHQ 2/9  Decreased Interest 0 0 0 0  Down, Depressed, Hopeless 0 1 0 0  PHQ - 2 Score 0 1 0 0  Altered sleeping  1    Tired, decreased energy  0    Change in appetite  0     Feeling bad or failure about yourself   1    Trouble concentrating  0    Moving slowly or fidgety/restless  0    Suicidal thoughts  0    PHQ-9 Score  3        03/27/2021    2:11 PM  GAD 7 : Generalized Anxiety Score  Nervous, Anxious, on Edge 0  Control/stop worrying 0  Worry too much - different things 1  Trouble relaxing 1  Restless 0  Easily annoyed or irritable 1  Afraid - awful might happen 0  Total GAD 7 Score 3     Immunization History  Administered Date(s) Administered   Influenza Inj Mdck Quad Pf 12/23/2017, 12/24/2017   Influenza Split 12/07/2012   Influenza Whole 03/09/2007, 01/07/2008   Influenza, High Dose Seasonal PF 01/09/2016, 04/30/2016, 01/22/2019, 01/16/2020, 01/19/2021   Influenza, Seasonal, Injecte, Preservative Fre 01/06/2015   Influenza,inj,Quad PF,6+ Mos 12/22/2015, 01/05/2017   Influenza-Unspecified 01/11/2014, 12/05/2015, 12/06/2021, 11/26/2022   PFIZER(Purple Top)SARS-COV-2 Vaccination 05/24/2019, 06/18/2019, 01/16/2020   PNEUMOCOCCAL CONJUGATE-20 04/12/2023   Pfizer(Comirnaty)Fall Seasonal Vaccine 12 years and older 01/09/2022   Td 03/08/2006   Tdap 11/02/2019   Unspecified SARS-COV-2 Vaccination 05/24/2019, 06/18/2019, 01/02/2021, 01/09/2022, 11/26/2022   Zoster Recombinant(Shingrix ) 04/12/2023    Past Medical History:  Diagnosis Date   Allergic rhinitis    Allergy    Chronic allergic conjunctivitis    Generalized abdominal pain 11/08/2018   GERD (gastroesophageal reflux disease)  Hallux valgus (acquired), left foot 03/28/2019   Heart murmur    Thyroid  disease    Urticaria due to cold and heat    Allergies  Allergen Reactions   Cefdinir Hives   Cefuroxime Axetil     REACTION: red rash   Cephalexin     REACTION: red rash   Sulfamethoxazole-Trimethoprim     Past Surgical History:  Procedure Laterality Date   MANDIBLE SURGERY     NASAL SINUS SURGERY     polyps removed   Family History  Problem Relation Age of Onset    Diabetes Mother    Kidney disease Mother    Depression Mother    Allergic rhinitis Mother    Diabetes Father    Hypertension Father    Heart disease Father    Diabetes Brother    Hypertension Brother    Kidney disease Maternal Aunt    Depression Maternal Grandfather        suicide   Cancer Maternal Grandfather        lymphoma   Colon cancer Neg Hx    Colon polyps Neg Hx    Esophageal cancer Neg Hx    Stomach cancer Neg Hx    Rectal cancer Neg Hx    Social History   Social History Narrative   Live w/husband Futures Trader & 3 kids Bliss, Taylan, Aleyna).    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    Smokes daily. Rare use of alcohol, no drugs.    Drinks some caffeine, uses herbal remedies.   Wears her seatbelt, bicycle helmet, smoke detector in the home.    Exercises routinely.    Feels safe in her relationships.     Allergies as of 04/12/2023       Reactions   Cefdinir Hives   Cefuroxime Axetil    REACTION: red rash   Cephalexin    REACTION: red rash   Sulfamethoxazole-trimethoprim          Medication List        Accurate as of April 12, 2023  8:40 AM. If you have any questions, ask your nurse or doctor.          STOP taking these medications    predniSONE  50 MG tablet Commonly known as: DELTASONE  Stopped by: Charlies Bellini       TAKE these medications    albuterol 108 (90 Base) MCG/ACT inhaler Commonly known as: VENTOLIN HFA albuterol sulfate HFA 90 mcg/actuation aerosol inhaler   Armour Thyroid  60 MG tablet Generic drug: thyroid  Take 60 mg by mouth daily.   azelastine 0.05 % ophthalmic solution Commonly known as: OPTIVAR Place 1 drop into both eyes as needed.   Azelastine HCl 137 MCG/SPRAY Soln Place into both nostrils.   fluticasone 50 MCG/ACT nasal spray Commonly known as: FLONASE Place 1 spray into both nostrils daily.   levocetirizine 5 MG tablet Commonly known as: XYZAL Take 5 mg by mouth at bedtime.   montelukast 10 MG  tablet Commonly known as: SINGULAIR Take 10 mg by mouth at bedtime.   Vitamin D  50 MCG (2000 UT) Caps Take by mouth.        All past medical history, surgical history, allergies, family history, immunizations andmedications were updated in the EMR today and reviewed under the history and medication portions of their EMR.     No results found for this or any previous visit (from the past 2160 hours).    ROS 14 pt review of systems performed and negative (unless mentioned  in an HPI)  Objective: BP 124/80   Pulse 74   Temp 97.8 F (36.6 C)   Ht 5' 7 (1.702 m)   Wt 190 lb 6.4 oz (86.4 kg)   SpO2 97%   BMI 29.82 kg/m  Physical Exam Vitals and nursing note reviewed.  Constitutional:      General: She is not in acute distress.    Appearance: Normal appearance. She is not ill-appearing or toxic-appearing.  HENT:     Head: Normocephalic and atraumatic.     Right Ear: Tympanic membrane, ear canal and external ear normal. There is no impacted cerumen.     Left Ear: Tympanic membrane, ear canal and external ear normal. There is no impacted cerumen.     Nose: No congestion or rhinorrhea.     Mouth/Throat:     Mouth: Mucous membranes are moist.     Pharynx: Oropharynx is clear. No oropharyngeal exudate or posterior oropharyngeal erythema.  Eyes:     General: No scleral icterus.       Right eye: No discharge.        Left eye: No discharge.     Extraocular Movements: Extraocular movements intact.     Conjunctiva/sclera: Conjunctivae normal.     Pupils: Pupils are equal, round, and reactive to light.  Cardiovascular:     Rate and Rhythm: Normal rate and regular rhythm.     Pulses: Normal pulses.     Heart sounds: Normal heart sounds. No murmur heard.    No friction rub. No gallop.  Pulmonary:     Effort: Pulmonary effort is normal. No respiratory distress.     Breath sounds: Normal breath sounds. No stridor. No wheezing, rhonchi or rales.  Chest:     Chest wall: No  tenderness.  Abdominal:     General: Abdomen is flat. Bowel sounds are normal. There is no distension.     Palpations: Abdomen is soft. There is no mass.     Tenderness: There is no abdominal tenderness. There is no right CVA tenderness, left CVA tenderness, guarding or rebound.     Hernia: No hernia is present.  Musculoskeletal:        General: No swelling, tenderness or deformity. Normal range of motion.     Cervical back: Normal range of motion and neck supple. No rigidity or tenderness.     Right lower leg: No edema.     Left lower leg: No edema.  Lymphadenopathy:     Cervical: No cervical adenopathy.  Skin:    General: Skin is warm and dry.     Coloration: Skin is not jaundiced or pale.     Findings: No bruising, erythema, lesion or rash.  Neurological:     General: No focal deficit present.     Mental Status: She is alert and oriented to person, place, and time. Mental status is at baseline.     Cranial Nerves: No cranial nerve deficit.     Sensory: No sensory deficit.     Motor: No weakness.     Coordination: Coordination normal.     Gait: Gait normal.     Deep Tendon Reflexes: Reflexes normal.  Psychiatric:        Mood and Affect: Mood normal.        Behavior: Behavior normal.        Thought Content: Thought content normal.        Judgment: Judgment normal.      No results found.  Assessment/plan: Emily   P Mclean is a 53 y.o. female present for CPE  Routine general medical examination at a health care facility Patient was encouraged to exercise greater than 150 minutes a week. Patient was encouraged to choose a diet filled with fresh fruits and vegetables, and lean meats. AVS provided to patient today for education/recommendation on gender specific health and safety maintenance. Colonoscopy: No family history.  Colonoscopy completed 05/12/2021-Dr. Armbruster 5-year follow-up Mammogram: No family history of breast cancer.  Mammogram completed 03/30/2022 at  gynecology.> scheduled-requested most recent record Cervical cancer screening: last pap: 03/30/2022, completed by: [facility] Dr. Antonio.  Immunizations: tdap UTD 2021, Influenza UTD 2024(encouraged yearly), shingrix  #1 today, pna completed today Infectious disease screening: HIV completed , Hep C completed DEXA: Routine screening at age 33  3 months nurse visit for shingrix  #2  Return in about 1 year (around 04/12/2024) for cpe (20 min).  Orders Placed This Encounter  Procedures   Pneumococcal conjugate vaccine 20-valent   Varicella-zoster vaccine IM   CBC   Comprehensive metabolic panel   Hemoglobin A1c   TSH   Lipid panel   No orders of the defined types were placed in this encounter.  Referral Orders  No referral(s) requested today     Electronically signed by: Charlies Bellini, DO [facility]

## 2023-04-13 ENCOUNTER — Encounter: Payer: Self-pay | Admitting: Family Medicine

## 2023-04-13 DIAGNOSIS — M79604 Pain in right leg: Secondary | ICD-10-CM | POA: Diagnosis not present

## 2023-04-13 DIAGNOSIS — M79662 Pain in left lower leg: Secondary | ICD-10-CM | POA: Diagnosis not present

## 2023-04-13 DIAGNOSIS — I83893 Varicose veins of bilateral lower extremities with other complications: Secondary | ICD-10-CM | POA: Diagnosis not present

## 2023-04-13 DIAGNOSIS — M79661 Pain in right lower leg: Secondary | ICD-10-CM | POA: Diagnosis not present

## 2023-04-14 DIAGNOSIS — I83893 Varicose veins of bilateral lower extremities with other complications: Secondary | ICD-10-CM | POA: Diagnosis not present

## 2023-04-27 DIAGNOSIS — I83891 Varicose veins of right lower extremities with other complications: Secondary | ICD-10-CM | POA: Diagnosis not present

## 2023-05-31 ENCOUNTER — Ambulatory Visit
Admission: RE | Admit: 2023-05-31 | Discharge: 2023-05-31 | Disposition: A | Discharge status: Home / Self Care | Source: Ambulatory Visit | Attending: Family Medicine | Admitting: Family Medicine

## 2023-05-31 ENCOUNTER — Other Ambulatory Visit: Payer: Self-pay

## 2023-05-31 ENCOUNTER — Ambulatory Visit (INDEPENDENT_AMBULATORY_CARE_PROVIDER_SITE_OTHER)

## 2023-05-31 VITALS — BP 145/77 | HR 75 | Temp 97.7°F | Resp 16

## 2023-05-31 DIAGNOSIS — M1712 Unilateral primary osteoarthritis, left knee: Secondary | ICD-10-CM

## 2023-05-31 DIAGNOSIS — M25562 Pain in left knee: Secondary | ICD-10-CM | POA: Diagnosis not present

## 2023-05-31 DIAGNOSIS — G8929 Other chronic pain: Secondary | ICD-10-CM | POA: Diagnosis not present

## 2023-05-31 MED ORDER — IBUPROFEN 600 MG PO TABS: 600.0000 mg | ORAL_TABLET | Freq: Four times a day (QID) | ORAL | 0 refills | Status: DC | PRN

## 2023-05-31 MED ORDER — METHYLPREDNISOLONE 4 MG PO TBPK: ORAL_TABLET | ORAL | 0 refills | Status: DC

## 2023-05-31 NOTE — ED Triage Notes (Signed)
 C/o left knee pain since February which comes and goes. Last night pain was radiating down leg, hurt when bending leg. Feels better when leg is extended. No known injury. Has been taking ibuprofen which helps for a short period of time. Has been elevating leg at night which helps some.

## 2023-05-31 NOTE — Discharge Instructions (Signed)
 Take the Medrol Dosepak as directed.  This is a steroid that will help take the inflammation down in your knee. After the Medrol you can take ibuprofen 3 times a day with food.  Take this as needed for knee pain Try to strengthen your quadricep muscle Limit walking while your knee is painful See sports medicine if your pain persists

## 2023-05-31 NOTE — ED Provider Notes (Signed)
 [facility]    CSN: [id number] Arrival date & time: 05/31/23  1540      History   Chief Complaint Chief Complaint  Patient presents with   Knee Pain    HPI Emily Mclean is a 53 y.o. female.   HPI  Patient is here for knee pain.  She states has been bothering her for about 4 weeks.  She states that hurts with going up and down stairs and certain activities.  Some days hurts more than others.  Some days feels like it is grinding.  She has seen a chiropractor and this has not helped.  She has not seen any swelling.  She has not had any trauma, overuse, or new activity or exercise.  Does not recall injury as a child.  Gets minor relief from ibuprofen.  Past Medical History:  Diagnosis Date   Allergic rhinitis    Allergy    Chronic allergic conjunctivitis    Generalized abdominal pain 11/08/2018   GERD (gastroesophageal reflux disease)    Hallux valgus (acquired), left foot 03/28/2019   Heart murmur    Thyroid disease    Urticaria due to cold and heat     Patient Active Problem List   Diagnosis Date Noted   Allergic rhinitis 02/15/2020   Chronic allergic conjunctivitis 02/15/2020   Urticaria due to cold and heat 02/15/2020   Hypocalcemia 11/02/2019   Hypothyroidism 11/02/2019    Past Surgical History:  Procedure Laterality Date   MANDIBLE SURGERY     NASAL SINUS SURGERY     polyps removed    OB History     Gravida  3   Para  3   Term  3   Preterm      AB      Living  3      SAB      IAB      Ectopic      Multiple      Live Births               Home Medications    Prior to Admission medications   Medication Sig Start Date End Date Taking? Authorizing Provider  ibuprofen (ADVIL) 600 MG tablet Take 1 tablet (600 mg total) by mouth every 6 (six) hours as needed. 05/31/23  Yes Eustace Moore, MD  methylPREDNISolone (MEDROL DOSEPAK) 4 MG TBPK tablet tad 05/31/23  Yes Eustace Moore, MD  albuterol (VENTOLIN HFA) 108  (90 Base) MCG/ACT inhaler albuterol sulfate HFA 90 mcg/actuation aerosol inhaler    [provider]  ARMOUR THYROID 60 MG tablet Take 60 mg by mouth daily. 03/21/21   [provider]  azelastine (OPTIVAR) 0.05 % ophthalmic solution Place 1 drop into both eyes as needed.  01/09/16   [provider]  Azelastine HCl 137 MCG/SPRAY SOLN Place into both nostrils. 04/08/21   [provider]  Cholecalciferol (VITAMIN D) 2000 UNITS CAPS Take by mouth.    [provider]  fluticasone (FLONASE) 50 MCG/ACT nasal spray Place 1 spray into both nostrils daily.  01/09/16   [provider]  levocetirizine (XYZAL) 5 MG tablet Take 5 mg by mouth at bedtime. 01/16/20   [provider]  montelukast (SINGULAIR) 10 MG tablet Take 10 mg by mouth at bedtime.  01/09/16   [provider]    Family History Family History  Problem Relation Age of Onset   Diabetes Mother    Kidney disease Mother    Depression  Mother    Allergic rhinitis Mother    Diabetes Father    Hypertension Father    Heart disease Father    Diabetes Brother    Hypertension Brother    Kidney disease Maternal Aunt    Depression Maternal Grandfather        suicide   Cancer Maternal Grandfather        lymphoma   Colon cancer Neg Hx    Colon polyps Neg Hx    Esophageal cancer Neg Hx    Stomach cancer Neg Hx    Rectal cancer Neg Hx     Social History Social History   Tobacco Use   Smoking status: Every Day    Current packs/day: 0.50    Average packs/day: 0.5 packs/day for 19.0 years (9.5 ttl pk-yrs)    Types: Cigarettes   Smokeless tobacco: Never  Vaping Use   Vaping status: Never Used  Substance Use Topics   Alcohol use: Yes    Comment: occasional   Drug use: No     Allergies   Cefdinir, Cefuroxime axetil, Cephalexin, and Sulfamethoxazole-trimethoprim   Review of Systems Review of Systems See HPI  Physical Exam Triage Vital Signs ED Triage Vitals   Encounter Vitals Group     BP 05/31/23 1548 (!) 145/77     Systolic BP Percentile --      Diastolic BP Percentile --      Pulse Rate 05/31/23 1548 75     Resp 05/31/23 1548 16     Temp 05/31/23 1548 97.7 F (36.5 C)     Temp src --      SpO2 05/31/23 1548 96 %     Weight --      Height --      Head Circumference --      Peak Flow --      Pain Score 05/31/23 1551 6     Pain Loc --      Pain Education --      Exclude from Growth Chart --    No data found.  Updated Vital Signs BP (!) 145/77   Pulse 75   Temp 97.7 F (36.5 C)   Resp 16   SpO2 96%       Physical Exam Constitutional:      General: She is not in acute distress.    Appearance: She is well-developed and normal weight.  HENT:     Head: Normocephalic and atraumatic.  Eyes:     Conjunctiva/sclera: Conjunctivae normal.     Pupils: Pupils are equal, round, and reactive to light.  Cardiovascular:     Rate and Rhythm: Normal rate.  Pulmonary:     Effort: Pulmonary effort is normal. No respiratory distress.  Abdominal:     General: There is no distension.     Palpations: Abdomen is soft.  Musculoskeletal:        General: Tenderness present. No swelling, deformity or signs of injury. Normal range of motion.     Cervical back: Normal range of motion.     Left knee: No swelling, deformity or effusion. No LCL laxity, MCL laxity, ACL laxity or PCL laxity.Normal pulse.     Instability Tests: Anterior drawer test negative.     Comments: Medial greater than lateral joint line tenderness.  Mild patellofemoral crepitus.  Pain with patellofemoral grind testing.  Full range of motion.  No instability.  No effusion  Skin:    General: Skin is warm and dry.  Neurological:  Mental Status: She is alert.     Gait: Gait normal.      UC Treatments / Results  Labs (all labs ordered are listed, but only abnormal results are displayed) Labs Reviewed - No data to display  EKG   Radiology DG Knee AP/LAT W/Sunrise  Left Result Date: 05/31/2023 CLINICAL DATA:  Chronic lateral knee pain, worse over the past 4 weeks. No known injury. EXAM: LEFT KNEE 3 VIEWS COMPARISON:  None Available. FINDINGS: No acute fracture or dislocation. No joint effusion. Mild medial compartment joint space narrowing. Small tricompartmental marginal osteophytes. Soft tissues are unremarkable. IMPRESSION: 1. Mild tricompartmental osteoarthritis. Electronically Signed   By: Obie Dredge M.D.   On: 05/31/2023 17:08    Procedures Procedures (including critical care time)  Medications Ordered in UC Medications - No data to display  Initial Impression / Assessment and Plan / UC Course  I have reviewed the triage vital signs and the nursing notes.  Pertinent labs & imaging results that were available during my care of the patient were reviewed by me and considered in my medical decision making (see chart for details).     X-rays discussed with patient.  I told her that her x-rays really look pretty good with only minimal changes.  She is having anterior knee pain with patellofemoral grind that is causing the pain on the stairs.  Also some joint line tenderness.  Will treat her with some steroids initially followed by anti-inflammatories.  Knee exercises discussed.  May benefit from PT if she fails to improve Final Clinical Impressions(s) / UC Diagnoses   Final diagnoses:  Acute pain of left knee     Discharge Instructions      Take the Medrol Dosepak as directed.  This is a steroid that will help take the inflammation down in your knee. After the Medrol you can take ibuprofen 3 times a day with food.  Take this as needed for knee pain Try to strengthen your quadricep muscle Limit walking while your knee is painful See sports medicine if your pain persists     ED Prescriptions     Medication Sig Dispense Auth. Provider   methylPREDNISolone (MEDROL DOSEPAK) 4 MG TBPK tablet tad 21 tablet Eustace Moore, MD    ibuprofen (ADVIL) 600 MG tablet Take 1 tablet (600 mg total) by mouth every 6 (six) hours as needed. 30 tablet Eustace Moore, MD      PDMP not reviewed this encounter.   Eustace Moore, MD 05/31/23 [phone]

## 2023-07-08 ENCOUNTER — Ambulatory Visit: Admitting: Sports Medicine

## 2023-07-08 DIAGNOSIS — M1712 Unilateral primary osteoarthritis, left knee: Secondary | ICD-10-CM

## 2023-07-08 MED ORDER — DICLOFENAC SODIUM 1 % EX GEL: 4.0000 g | Freq: Four times a day (QID) | CUTANEOUS | 11 refills | Status: AC

## 2023-07-08 NOTE — Patient Instructions (Signed)
 Emily Mclean

## 2023-07-08 NOTE — Assessment & Plan Note (Signed)
 Very pleasant 53 year old female, she has had several weeks of increasing pain left knee lateral aspect, no trauma. Was seen in urgent care, given some steroids, this helped, she also had x-rays done that showed mild tricompartmental osteoarthritis. No mechanical symptoms, on exam she does have tenderness to palpation at the lateral joint line, the rest of the exam is normal with the exception of a minimally positive McMurray's sign with tenderness laterally but no popping. I do suspect she has pain from her arthritis and likely some degenerative meniscal fraying. We discussed the anatomy and pathophysiology, she will get some knee sleeves, we will hold off on oral treatment and start with topical Voltaren , formal PT, return to see me in 6 weeks, we will consider oral treatment versus injection if not better.

## 2023-07-08 NOTE — Progress Notes (Signed)
    Procedures performed today:    None.  Independent interpretation of notes and tests performed by another provider:   None.  Brief History, Exam, Impression, and Recommendations:    Primary osteoarthritis of left knee Very pleasant 53 year old female, she has had several weeks of increasing pain left knee lateral aspect, no trauma. Was seen in urgent care, given some steroids, this helped, she also had x-rays done that showed mild tricompartmental osteoarthritis. No mechanical symptoms, on exam she does have tenderness to palpation at the lateral joint line, the rest of the exam is normal with the exception of a minimally positive McMurray's sign with tenderness laterally but no popping. I do suspect she has pain from her arthritis and likely some degenerative meniscal fraying. We discussed the anatomy and pathophysiology, she will get some knee sleeves, we will hold off on oral treatment and start with topical Voltaren , formal PT, return to see me in 6 weeks, we will consider oral treatment versus injection if not better.  Chronic process with exacerbation and pharmacologic intervention  ____________________________________________ Joselyn Nicely. Sandy Crumb, M.D., ABFM., CAQSM., AME. Primary Care and Sports Medicine [facility]  Adjunct Professor of [facility] Medicine  [employer]  Restaurant manager, fast food

## 2023-07-12 ENCOUNTER — Ambulatory Visit: Payer: BC Managed Care – PPO

## 2023-07-12 DIAGNOSIS — Z23 Encounter for immunization: Secondary | ICD-10-CM

## 2023-07-12 NOTE — Progress Notes (Signed)
 Pt here for 2nd Shingrix  injection per Dr Marylee Snowball   pt tolerated injection well.

## 2023-07-13 NOTE — Therapy (Unsigned)
 OUTPATIENT PHYSICAL THERAPY LOWER EXTREMITY EVALUATION   Patient Name: Emily Mclean MRN: [id number] DOB:[birth year], 53 y.o., female Today's Date: 07/14/2023  END OF SESSION:  PT End of Session - 07/14/23 2205     Visit Number 1    Number of Visits 16    Date for PT Re-Evaluation 09/08/23    Authorization Type [employer] of [place]    Authorization Time Period per year    Authorization - Visit Number 1    Authorization - Number of Visits 30    PT Start Time 1315    PT Stop Time 1400    PT Time Calculation (min) 45 min    Activity Tolerance Patient tolerated treatment well             Past Medical History:  Diagnosis Date   Allergic rhinitis    Allergy    Chronic allergic conjunctivitis    Generalized abdominal pain 11/08/2018   GERD (gastroesophageal reflux disease)    Hallux valgus (acquired), left foot 03/28/2019   Heart murmur    Thyroid  disease    Urticaria due to cold and heat    Past Surgical History:  Procedure Laterality Date   MANDIBLE SURGERY     NASAL SINUS SURGERY     polyps removed   Patient Active Problem List   Diagnosis Date Noted   Primary osteoarthritis of left knee 07/08/2023   Allergic rhinitis 02/15/2020   Chronic allergic conjunctivitis 02/15/2020   Urticaria due to cold and heat 02/15/2020   Hypocalcemia 11/02/2019   Hypothyroidism 11/02/2019    PCP: Dr Mariel Shope  REFERRING PROVIDER: Dr Annemarie Kil   REFERRING DIAG: OA L knee   THERAPY DIAG:  Acute pain of left knee  Other symptoms and signs involving the musculoskeletal system  Muscle weakness (generalized)  Rationale for Evaluation and Treatment: Rehabilitation  ONSET DATE: 04/09/23  SUBJECTIVE:   SUBJECTIVE STATEMENT: Patient reports that she has been having pain in the L knee since 2/25 with no known injury and no problems in the past. She has pain more with the knee bent. She has less pain in the morning and symptoms worsen toward the end of the day.    PERTINENT HISTORY: Fracture of grown plate L ankle in 5th grade - walked on L LE without diagnosis for ~ 2 weeks. Xrays showed fx - casted for 8 weeks NWB Denies any medical or musculoskeletal problems  PAIN:  Are you having pain? Yes: NPRS scale: 3/10; worst in the past week 9/10 end of day Pain location: pain in the lateral posterior aspect of L knee - radiating into mid calf  Pain description: pressure; solid pain - intermittent mostly constant  Aggravating factors: sitting with knee bent; steps - worse at the end of day  Relieving factors: meds; Voltaren  gel; elevation at night - better in the morning   PRECAUTIONS: None  RED FLAGS: None   WEIGHT BEARING RESTRICTIONS: No  FALLS:  Has patient fallen in last 6 months? 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  DIAGNOSTIC FINDINGS: xray- showed mild tricompartmental osteoarthritis.   PATIENT SURVEYS:  LEFS 19/80; 23.8%  COGNITION: Overall cognitive status: Within functional limits for tasks assessed     SENSATION: WFL  EDEMA:  No noticeable   MUSCLE LENGTH: Hamstrings: Right 75 deg; Left 70 deg Thomas test: Right 10 deg; Left neutral   POSTURE: rounded shoulders and increased thoracic kyphosis; stands with bilat knees hyperextended   PALPATION: Tenderness L knee - medial joint line; lateral head of the gastrocnemius; less through the distal lateral hamstring;   LOWER EXTREMITY ROM: WFL's -  tight hip rotation L compared to R; bilat knee ROM equal and pain free   Active ROM Right eval Left eval  Hip flexion    Hip extension    Hip abduction    Hip adduction    Hip internal rotation    Hip external rotation    Knee flexion    Knee extension    Ankle dorsiflexion    Ankle plantarflexion    Ankle inversion    Ankle eversion     (Blank rows = not tested)  LOWER EXTREMITY MMT:  MMT Right eval Left eval  Hip flexion 5 4  Hip extension 4+ 4  Hip abduction 5 4  Hip adduction    Hip internal rotation    Hip external rotation    Knee flexion 5 4+  Knee extension 5 5  Ankle dorsiflexion  Decreased compared to R; Tight  Ankle plantarflexion    Ankle inversion  Decreased compared to R; tight   Ankle eversion     (Blank rows = not tested)  FUNCTIONAL TESTS:  5 times sit to stand: 14.43 sec knees adducted  SLS R 10 sec; L 8 sec, more difficult  GAIT: Distance walked: 40 feet Assistive device utilized: None Level of assistance: Complete Independence Comments: WFL's; no notable limp L LE                                                                                                                                 TREATMENT DATE: 07/14/23   Review of evaluation findings and POC Exercise instruction  as noted below   PATIENT EDUCATION:  Education details: POC; HEP  Person educated: Patient Education method: Programmer, multimedia, Demonstration, Tactile cues, Verbal cues, and Handouts Education comprehension: verbalized understanding, returned demonstration, verbal cues required, tactile cues required, and needs further education  HOME EXERCISE PROGRAM: Access Code: [email or web address] URL: [email or web address] Date: 07/14/2023 Prepared by: Vail Basista  Exercises - Hip Flexor Stretch at [facility] of Bed  - 2 x daily - 7 x weekly - 1 sets - 3 reps - 30 sec  hold - Hooklying Hamstring Stretch with Strap  - 2 x daily - 7 x weekly - 1 sets - 3 reps - 30 sec  hold - Supine  ITB Stretch  with Strap  - 2 x daily - 7 x weekly - 1 sets - 3 reps - 30 sec  hold - Gastroc Stretch on Wall  - 2 x daily - 7 x weekly - 1 sets - 3 reps - 30 sec  hold - Soleus Stretch on Wall  - 2 x daily - 7 x weekly - 1 sets - 3 reps - 30 sec  hold - Seated Hamstring Stretch  - 2 x daily - 7 x weekly - 1 sets - 3 reps - 30 sec  hold  ASSESSMENT:  CLINICAL IMPRESSION: Patient is a 53 y.o. female who was seen today for physical therapy evaluation and treatment for OA L knee. She reports L knee with no known injury. Symptoms have been present over the past 2-3 months with no known injury. She does report that she had a fracture of L ankle in 5th grade with delayed diagnosis and subsequent casting x 8 weeks NWB. Ankle fracture resolved with no further trouble. Of note, patient has decreased L ankle and hip mobility; poor tracking of L patella, decreased strength L LE; pain with sitting at her desk and walking, especially stairs. Patient stands with bilat knees hyperextended. She has pain with functional activities especially when sitting at her desk for extended periods of time. Patient will benefit from PT to address deficits identified.   OBJECTIVE IMPAIRMENTS: decreased activity tolerance, decreased balance, decreased strength, impaired flexibility, improper body mechanics, and pain.   ACTIVITY LIMITATIONS: bending, sitting, standing, squatting, and stairs  PARTICIPATION LIMITATIONS: driving, community activity, and occupation  PERSONAL FACTORS: Behavior pattern, Fitness, Past/current experiences, and Profession are also affecting patient's functional outcome.   REHAB POTENTIAL: Good  CLINICAL DECISION MAKING: Evolving/moderate complexity  EVALUATION COMPLEXITY: Moderate   GOALS: Goals reviewed with patient? Yes  SHORT TERM GOALS: Target date: 08/11/2023   Independent in initial HEP  Baseline: Goal status: INITIAL  2.  Patient to report sitting at her desk for 2 hours without  experencing pain =/> 1/10 Baseline:  Goal status: INITIAL  3.  Patient reports and demonstrates appropriate posture and alignment with re- to: office ergonomics with good position of LE's; avoid sitting with ankle/knee crossed; avoid standing with knees hyperextended  Baseline:  Goal status: INITIAL   LONG TERM GOALS: Target date: 09/08/2023   Decrease pain L knee by 80-100% allowing patient to return to all normal functional activities  Baseline:  Goal status: INITIAL  2.  5/5 strength bilat hips;knees; ankles  Baseline:  Goal status: INITIAL  3.  Improve single leg stance time on L LE to 10 or more seconds with no upper limb support  Baseline:  Goal status: INITIAL  4.  Patient reports ability to sit at her desk for typical work day with minima to no knee pain Baseline: "almost constant" pulling pain Goal status: INITIAL  5.  Independent in HEP including aquatic therapy as indicated  Baseline:  Goal status: INITIAL  6.  Improve LEFS Left knee by 10-15 points  Baseline:  Goal status: INITIAL   PLAN:  PT FREQUENCY: 2x/week  PT DURATION: 8 weeks  PLANNED INTERVENTIONS: 97164- PT Re-evaluation, 97110-Therapeutic exercises, 97530- Therapeutic activity, 97112- Neuromuscular re-education, 97535- Self Care, 60454- Manual therapy, [phone]- Gait training, Stair training, Taping, Dry Needling, and Joint mobilization  PLAN FOR NEXT SESSION: review and progress with exercises; manaul work and modalities as indicated    [employer], PT 07/14/2023, 10:30 PM

## 2023-07-14 ENCOUNTER — Ambulatory Visit: Attending: Sports Medicine | Admitting: Rehabilitative and Restorative Service Providers"

## 2023-07-14 ENCOUNTER — Encounter: Payer: Self-pay | Admitting: Rehabilitative and Restorative Service Providers"

## 2023-07-14 ENCOUNTER — Other Ambulatory Visit: Payer: Self-pay

## 2023-07-14 DIAGNOSIS — R29898 Other symptoms and signs involving the musculoskeletal system: Secondary | ICD-10-CM | POA: Diagnosis present

## 2023-07-14 DIAGNOSIS — M25562 Pain in left knee: Secondary | ICD-10-CM | POA: Diagnosis not present

## 2023-07-14 DIAGNOSIS — M6281 Muscle weakness (generalized): Secondary | ICD-10-CM

## 2023-07-14 DIAGNOSIS — M1712 Unilateral primary osteoarthritis, left knee: Secondary | ICD-10-CM | POA: Insufficient documentation

## 2023-07-18 NOTE — Therapy (Signed)
 OUTPATIENT PHYSICAL THERAPY LOWER EXTREMITYTREATMENT   Patient Name: Emily Mclean MRN: [id number] DOB:08-31-1970, 53 y.o., female Today's Date: 07/19/2023  END OF SESSION:  PT End of Session - 07/19/23 0841     Visit Number 2    Number of Visits 16    Date for PT Re-Evaluation 09/08/23    Authorization Type [employer] of [place]    Authorization Time Period per year    Authorization - Visit Number 2    PT Start Time [id number]    PT Stop Time 0927    PT Time Calculation (min) 46 min    Activity Tolerance Patient tolerated treatment well    Behavior During Therapy [facility] for tasks assessed/performed              Past Medical History:  Diagnosis Date   Allergic rhinitis    Allergy    Chronic allergic conjunctivitis    Generalized abdominal pain 11/08/2018   GERD (gastroesophageal reflux disease)    Hallux valgus (acquired), left foot 03/28/2019   Heart murmur    Thyroid  disease    Urticaria due to cold and heat    Past Surgical History:  Procedure Laterality Date   MANDIBLE SURGERY     NASAL SINUS SURGERY     polyps removed   Patient Active Problem List   Diagnosis Date Noted   Primary osteoarthritis of left knee 07/08/2023   Allergic rhinitis 02/15/2020   Chronic allergic conjunctivitis 02/15/2020   Urticaria due to cold and heat 02/15/2020   Hypocalcemia 11/02/2019   Hypothyroidism 11/02/2019    PCP: Dr Mariel Shope  REFERRING PROVIDER: Dr Annemarie Kil   REFERRING DIAG: OA L knee   THERAPY DIAG:  Acute pain of left knee  Other symptoms and signs involving the musculoskeletal system  Muscle weakness (generalized)  Rationale for Evaluation and Treatment: Rehabilitation  ONSET DATE: 04/09/23  SUBJECTIVE:   SUBJECTIVE STATEMENT: Went to [place] on Sunday and it started hurting going down the steps  PERTINENT HISTORY: Fracture of grown plate L ankle in 5th grade - walked on L LE without diagnosis for ~ 2 weeks. Xrays showed fx - casted for 8 weeks  NWB Denies any medical or musculoskeletal problems  PAIN:  Are you having pain? Yes: NPRS scale: 7/10 Pain location: pain in the lateral posterior aspect of L knee - radiating into mid calf  Pain description: pressure; solid pain - intermittent mostly constant  Aggravating factors: sitting with knee bent; steps - worse at the end of day  Relieving factors: meds; Voltaren  gel; elevation at night - better in the morning   PRECAUTIONS: None  RED FLAGS: None   WEIGHT BEARING RESTRICTIONS: No  FALLS:  Has patient fallen in last 6 months? 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  DIAGNOSTIC FINDINGS: xray- showed mild tricompartmental osteoarthritis.   PATIENT SURVEYS:  LEFS 19/80; 23.8%  COGNITION: Overall cognitive status: Within functional limits for tasks assessed  SENSATION: WFL  EDEMA:  No noticeable   MUSCLE LENGTH: Hamstrings: Right 75 deg; Left 70 deg Thomas test: Right 10 deg; Left neutral   POSTURE: rounded shoulders and increased thoracic kyphosis; stands with bilat knees hyperextended   PALPATION: Tenderness L knee - medial joint line; lateral head of the gastrocnemius; less through the distal lateral hamstring;   LOWER EXTREMITY ROM: WFL's - tight hip rotation L compared to R; bilat knee ROM equal and pain free   Active ROM Right eval Left eval  Hip flexion    Hip extension    Hip abduction    Hip adduction    Hip internal  rotation    Hip external rotation    Knee flexion    Knee extension    Ankle dorsiflexion    Ankle plantarflexion    Ankle inversion    Ankle eversion     (Blank rows = not tested)  LOWER EXTREMITY MMT:  MMT Right eval Left eval  Hip flexion 5 4  Hip extension 4+ 4  Hip abduction 5 4  Hip adduction    Hip internal rotation    Hip external rotation    Knee flexion 5 4+  Knee extension 5 5  Ankle dorsiflexion  Decreased compared to R; Tight  Ankle plantarflexion    Ankle inversion  Decreased compared to R; tight   Ankle eversion     (Blank rows = not tested)  FUNCTIONAL TESTS:  5 times sit to stand: 14.43 sec knees adducted  SLS R 10 sec; L 8 sec, more difficult  GAIT: Distance walked: 40 feet Assistive device utilized: None Level of assistance: Complete Independence Comments: WFL's; no notable limp L LE                                                                                                                                 TREATMENT DATE:  [facility] Adult PT Treatment:                                                DATE: 07/19/23 Therapeutic Exercise: Hip flexor stretch of EOB painful in low back so moved to standing with foot on stairs 2x30 sec HS stretch with strap x 1 min L ITB stretch with strap x 1 min L Gastroc stretch 2x30 sec L Soleus stretch2 x 30 sec L Seated fig 4 - reviewed - pt already doing at home Neuromuscular re-ed: Quad set with towel under knee 5 sec hold 2 x 10 SAQ 5 sec hold 2 x 10 SLR 2 x 10 SLR with ER 2 x 10 HS set with feet on orange swiss ball 10 sec hold x 10 S/L clams green band x 20 B Standing hip ABD and ext at 45 deg green band 2 x 10 ea  B Hip hike on 4 inch step Step down 4 inch 2x10 and 6 inch 1x 10 cues for alignment Step up 6 inch step x 20 L Therapeutic Activity: Squat tap to mat table 2x10 - some popping reported in knee   07/14/23   Review of evaluation findings and POC Exercise instruction  as noted below    PATIENT EDUCATION:  Education details:  HEP update [phone] Person educated: Patient Education method: Explanation, Demonstration, Tactile cues, Verbal cues, and Handouts Education comprehension: verbalized understanding, returned demonstration, verbal cues required, tactile cues required, and needs further education  HOME EXERCISE PROGRAM: Access Code: [email or web address] URL: [email or web address] Date: 07/19/2023 Prepared by: Concha Deed  Exercises - Hooklying Hamstring Stretch with Strap  - 2 x daily - 7 x weekly - 1 sets - 3 reps - 30 sec  hold - Supine ITB Stretch with Strap  - 2 x daily - 7 x weekly - 1 sets - 3 reps - 30 sec  hold - Gastroc Stretch on Wall  - 2 x daily - 7 x weekly - 1 sets - 3 reps - 30 sec  hold - Soleus Stretch on Wall  - 2 x daily - 7 x weekly - 1 sets - 3 reps - 30 sec  hold - Seated Hamstring Stretch  - 2 x daily - 7 x weekly - 1 sets - 3 reps - 30 sec  hold - Hip Flexor Stretch on Step  - 2 x daily - 7 x weekly - 1 sets - 3 reps - 30-60 sec hold - Clam with Resistance  - 1 x daily - 3 x weekly - 2-3 sets - 10 reps - Standing Hip Abduction with Resistance at Ankles and Counter Support  - 1 x daily - 3 x weekly - 2 sets - 10 reps - Standing Hip Extension with Resistance at THIGHS and Counter Support  - 1 x daily - 3 x weekly - 2 sets - 10 reps  ASSESSMENT:  CLINICAL IMPRESSION: Patient tolerated all exercises without c/o pain in L knee. She did use voltaren  gel prior to PT. She does demonstrate some hip weakness with step downs resulting in knee valgus. Focused on hip and knee strengthening today.  EVAL: Patient is a 53 y.o. female who was seen today for physical therapy evaluation and treatment for OA L knee. She reports L knee with no known injury. Symptoms have been present over the past 2-3 months with no known injury. She does report that she had a fracture of L ankle in 5th grade with delayed diagnosis and subsequent casting x 8 weeks NWB. Ankle  fracture resolved with no further trouble. Of note, patient has decreased L ankle and hip mobility; poor tracking of L patella, decreased strength L LE; pain with sitting at her desk and walking, especially stairs. Patient stands with bilat knees hyperextended. She has pain with functional activities especially when sitting at her desk for extended periods of time. Patient will benefit from PT to address deficits identified.   OBJECTIVE IMPAIRMENTS: decreased activity tolerance, decreased balance, decreased strength, impaired flexibility, improper body mechanics, and pain.   ACTIVITY LIMITATIONS: bending, sitting, standing, squatting, and stairs  PARTICIPATION LIMITATIONS: driving, community activity, and occupation  PERSONAL FACTORS: Behavior pattern, Fitness, Past/current experiences, and Profession are also affecting patient's functional outcome.   REHAB POTENTIAL: Good  CLINICAL DECISION MAKING: Evolving/moderate complexity  EVALUATION COMPLEXITY: Moderate   GOALS: Goals reviewed with patient? Yes  SHORT TERM GOALS: Target date: 08/11/2023  Independent in initial HEP  Baseline: Goal status: INITIAL  2.  Patient to report sitting at her desk for 2 hours without experencing pain =/> 1/10 Baseline:  Goal status: INITIAL  3.  Patient reports and demonstrates appropriate posture and alignment with re- to: office ergonomics with good position of LE's; avoid sitting with ankle/knee crossed; avoid standing with knees hyperextended  Baseline:  Goal status: INITIAL   LONG TERM GOALS: Target date: 09/08/2023   Decrease pain L knee by 80-100% allowing patient to return to all normal functional activities  Baseline:  Goal status: INITIAL  2.  5/5 strength bilat hips;knees; ankles  Baseline:  Goal status: INITIAL  3.  Improve single leg stance time on L LE to 10 or more seconds with no upper limb support  Baseline:  Goal status: INITIAL  4.  Patient reports ability to sit at her  desk for typical work day with minima to no knee pain Baseline: "almost constant" pulling pain Goal status: INITIAL  5.  Independent in HEP including aquatic therapy as indicated  Baseline:  Goal status: INITIAL  6.  Improve LEFS Left knee by 10-15 points  Baseline:  Goal status: INITIAL   PLAN:  PT FREQUENCY: 2x/week  PT DURATION: 8 weeks  PLANNED INTERVENTIONS: 97164- PT Re-evaluation, 97110-Therapeutic exercises, 97530- Therapeutic activity, 97112- Neuromuscular re-education, 97535- Self Care, 16109- Manual therapy, [phone]- Gait training, Stair training, Taping, Dry Needling, and Joint mobilization  PLAN FOR NEXT SESSION: review and progress with exercises; manaul work and modalities as indicated    [employer], PT  07/19/2023, 9:33 AM

## 2023-07-19 ENCOUNTER — Ambulatory Visit: Admitting: Physical Therapy

## 2023-07-19 ENCOUNTER — Encounter: Payer: Self-pay | Admitting: Physical Therapy

## 2023-07-19 DIAGNOSIS — R29898 Other symptoms and signs involving the musculoskeletal system: Secondary | ICD-10-CM

## 2023-07-19 DIAGNOSIS — M6281 Muscle weakness (generalized): Secondary | ICD-10-CM

## 2023-07-19 DIAGNOSIS — M1712 Unilateral primary osteoarthritis, left knee: Secondary | ICD-10-CM | POA: Diagnosis not present

## 2023-07-19 DIAGNOSIS — M25562 Pain in left knee: Secondary | ICD-10-CM | POA: Diagnosis not present

## 2023-07-20 NOTE — Therapy (Signed)
 OUTPATIENT PHYSICAL THERAPY LOWER EXTREMITYTREATMENT   Patient Name: Emily  TERISHA Mclean MRN: [id number] DOB:03/07/1971, 53 y.o., female Today's Date: 07/21/2023  END OF SESSION:  PT End of Session - 07/21/23 0758     Visit Number 3    Number of Visits 16    Date for PT Re-Evaluation 09/08/23    Authorization Time Period per year    Authorization - Visit Number 3    Authorization - Number of Visits 30    PT Start Time 0800    PT Stop Time 0845    PT Time Calculation (min) 45 min    Activity Tolerance Patient tolerated treatment well    Behavior During Therapy [facility] for tasks assessed/performed               Past Medical History:  Diagnosis Date   Allergic rhinitis    Allergy    Chronic allergic conjunctivitis    Generalized abdominal pain 11/08/2018   GERD (gastroesophageal reflux disease)    Hallux valgus (acquired), left foot 03/28/2019   Heart murmur    Thyroid  disease    Urticaria due to cold and heat    Past Surgical History:  Procedure Laterality Date   MANDIBLE SURGERY     NASAL SINUS SURGERY     polyps removed   Patient Active Problem List   Diagnosis Date Noted   Primary osteoarthritis of left knee 07/08/2023   Allergic rhinitis 02/15/2020   Chronic allergic conjunctivitis 02/15/2020   Urticaria due to cold and heat 02/15/2020   Hypocalcemia 11/02/2019   Hypothyroidism 11/02/2019    PCP: Dr Mariel Shope  REFERRING PROVIDER: Dr Annemarie Kil   REFERRING DIAG: OA L knee   THERAPY DIAG:  Acute pain of left knee  Other symptoms and signs involving the musculoskeletal system  Muscle weakness (generalized)  Rationale for Evaluation and Treatment: Rehabilitation  ONSET DATE: 04/09/23  SUBJECTIVE:   SUBJECTIVE STATEMENT: I did my exercises last night and then I had some trouble sleeping.   PERTINENT HISTORY: Fracture of growth plate L ankle in 5th grade - walked on L LE without diagnosis for ~ 2 weeks. Xrays showed fx - casted for  8 weeks NWB Denies any medical or musculoskeletal problems  PAIN:  Are you having pain? Yes: NPRS scale: 2/10 Pain location: pain in the lateral posterior aspect of L knee - radiating into mid calf  Pain description: pressure; solid pain - intermittent mostly constant  Aggravating factors: sitting with knee bent; steps - worse at the end of day  Relieving factors: meds; Voltaren  gel; elevation at night - better in the morning   PRECAUTIONS: None  RED FLAGS: None   WEIGHT BEARING RESTRICTIONS: No  FALLS:  Has patient fallen in last 6 months? 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  DIAGNOSTIC FINDINGS: xray- showed mild tricompartmental osteoarthritis.   PATIENT SURVEYS:  LEFS 19/80; 23.8%  COGNITION: Overall cognitive status: Within functional limits  for tasks assessed     SENSATION: WFL  EDEMA:  No noticeable   MUSCLE LENGTH: Hamstrings: Right 75 deg; Left 70 deg Thomas test: Right 10 deg; Left neutral   POSTURE: rounded shoulders and increased thoracic kyphosis; stands with bilat knees hyperextended   PALPATION: Tenderness L knee - medial joint line; lateral head of the gastrocnemius; less through the distal lateral hamstring;   LOWER EXTREMITY ROM: WFL's - tight hip rotation L compared to R; bilat knee ROM equal and pain free   Active ROM Right eval Left eval  Hip flexion    Hip extension    Hip abduction    Hip adduction    Hip  internal rotation    Hip external rotation    Knee flexion    Knee extension    Ankle dorsiflexion    Ankle plantarflexion    Ankle inversion    Ankle eversion     (Blank rows = not tested)  LOWER EXTREMITY MMT:  MMT Right eval Left eval  Hip flexion 5 4  Hip extension 4+ 4  Hip abduction 5 4  Hip adduction    Hip internal rotation    Hip external rotation    Knee flexion 5 4+  Knee extension 5 5  Ankle dorsiflexion  Decreased compared to R; Tight  Ankle plantarflexion    Ankle inversion  Decreased compared to R; tight   Ankle eversion     (Blank rows = not tested)  FUNCTIONAL TESTS:  5 times sit to stand: 14.43 sec knees adducted  SLS R 10 sec; L 8 sec, more difficult  GAIT: Distance walked: 40 feet Assistive device utilized: None Level of assistance: Complete Independence Comments: WFL's; no notable limp L LE                                                                                                                                 TREATMENT DATE:  [facility] Adult PT Treatment:                                                DATE: 07/21/23 Therapeutic Exercise: Prone quad stretch with strap 2 x 1 min Neuromuscular re-ed: Quad set with towel under knee 5 sec hold 2 x 10 mild lateral pain SAQ 5 sec hold 2 x 10 - no pain SLR 2 x 10 -  SLR with ER 2 x 10 Prone HS curl 5# 2x10 S/L clams blue band x 20 B Hip hike on 6 inch step x 20  Therapeutic Activity: Squat tap to mat table 2x10 with blue loop at thighs Step down 4 inch 2x10 and 6 inch 1x 10 cues for alignment 4 " 1 x 10 and 6: inch fwd heel taps 2x10 B - feels in medial post joint line  Manual Therapy: Trigger Point Dry Needling  Initial Treatment: Pt instructed on Dry Needling rational, procedures, and possible side effects. Pt instructed to expect mild to moderate muscle soreness later in the day and/or into the next day.  Pt instructed in methods to reduce muscle soreness. Pt instructed to continue  prescribed HEP. Patient was educated on signs and symptoms of infection and other risk factors and advised to seek medical attention should they occur.  Patient verbalized understanding of these instructions and education.   Patient Verbal Consent Given: Yes Education Handout Provided: Yes Muscles Treated: L med and lateral gastroc, L peroneals Electrical Stimulation Performed: No Treatment Response/Outcome: Utilized skilled palpation to identify bony landmarks and trigger points.  Able to illicit twitch response and muscle elongation.  Soft tissue mobilization to muscles needled  following DN to further promote tissue elongation and decreased pain.         [facility] Adult PT Treatment:                                                DATE: 07/19/23 Therapeutic Exercise: Hip flexor stretch of EOB painful in low back so moved to standing with foot on stairs 2x30 sec HS stretch with strap x 1 min L ITB stretch with strap x 1 min L Gastroc stretch 2x30 sec L Soleus stretch2 x 30 sec L Seated fig 4 - reviewed - pt already doing at home Neuromuscular re-ed: Quad set with towel under knee 5 sec hold 2 x 10 SAQ 5 sec hold 2 x 10 SLR 2 x 10 SLR with ER 2 x 10 HS set with feet on orange swiss ball 10 sec hold x 10 S/L clams green band x 20 B Standing hip ABD and ext at 45 deg green band 2 x 10 ea B Hip hike on 4 inch step Therapeutic Activity: Squat tap to mat chair 2x10 - some popping reported in knee Step down 4 inch 2x10 and 6 inch 1x 10 cues for alignment Step up 6 inch step x 20    07/14/23   Review of evaluation findings and POC Exercise instruction  as noted below   PATIENT EDUCATION:  Education details:  HEP update [phone]  Person educated: Patient Education method: Explanation, Demonstration, Tactile cues, Verbal cues, and Handouts Education comprehension: verbalized understanding, returned demonstration, verbal cues required, tactile cues required, and needs further  education  HOME EXERCISE PROGRAM: Access Code: [email or web address] URL: [email or web address] Date: 07/21/2023 Prepared by: Concha Deed  Exercises - Hooklying Hamstring Stretch with Strap  - 2 x daily - 7 x weekly - 1 sets - 3 reps - 30 sec  hold - Supine ITB Stretch with Strap  - 2 x daily - 7 x weekly - 1 sets - 3 reps - 30 sec  hold - Prone Quadriceps Stretch with Strap (Mirrored)  - 2 x daily - 7 x weekly - 1 sets - 3 reps - 60 sec hold - Gastroc Stretch on Wall  - 2 x daily - 7 x weekly - 1 sets - 3 reps - 30 sec  hold - Soleus Stretch on Wall  - 2 x daily - 7 x weekly - 1 sets - 3 reps - 30 sec  hold - Seated Hamstring Stretch  - 2 x daily - 7 x weekly - 1 sets - 3 reps - 30 sec  hold - Hip Flexor Stretch on Step  - 2 x daily - 7 x weekly - 1 sets - 3 reps - 30-60 sec hold - Clam with Resistance  - 1 x daily - 3 x weekly - 2-3 sets - 10 reps - Standing Hip Abduction with Resistance at Ankles and Counter Support  - 1 x daily - 3 x weekly - 2 sets - 10 reps - Standing Hip Extension with Resistance at THIGHS and Counter Support  - 1 x daily - 3 x weekly - 2 sets - 10 reps - Squat with Chair Touch and Resistance Loop  - 1 x daily - 3 x weekly - 2 sets - 10 reps - Forward Step Down Touch with Heel  - 1 x daily - 3 x weekly - 2 sets - 10 reps  ASSESSMENT:  CLINICAL IMPRESSION: Patient reporting less pain today. She continues to have pain affecting sleep. She reports mild discomfort with quad sets. SLR is challenging but no quad lag. Initial trial of DN to L gastroc resulted in immediate decrease in muscle tension and patient also reported relief in her low back. HEP progressed with prone quad stretch and functional strengthening.   EVAL: Patient is a 53 y.o. female who was seen today for physical therapy evaluation and treatment for OA L knee. She reports L knee with no known injury. Symptoms have been present over the past 2-3 months with no known injury. She does report that she had a  fracture of L ankle in 5th grade with delayed diagnosis and subsequent casting x 8 weeks NWB. Ankle fracture resolved with no further trouble. Of note, patient has decreased L ankle and hip mobility; poor tracking of L patella, decreased strength L LE; pain with sitting at her desk and walking, especially stairs. Patient stands with bilat knees hyperextended. She has pain with functional activities especially when sitting at her desk for extended periods of time. Patient will benefit from PT to address deficits identified.   OBJECTIVE IMPAIRMENTS: decreased activity tolerance, decreased balance, decreased strength, impaired flexibility, improper body mechanics, and pain.   ACTIVITY LIMITATIONS: bending, sitting, standing, squatting, and stairs  PARTICIPATION LIMITATIONS: driving, community activity, and occupation  PERSONAL FACTORS: Behavior pattern, Fitness, Past/current experiences, and Profession are also affecting patient's functional outcome.   REHAB POTENTIAL: Good  CLINICAL DECISION MAKING: Evolving/moderate complexity  EVALUATION COMPLEXITY: Moderate   GOALS: Goals reviewed with patient? Yes  SHORT TERM GOALS: Target date: 08/11/2023   Independent in initial HEP  Baseline: Goal status: INITIAL  2.  Patient to report sitting at her desk for 2 hours without experencing pain =/> 1/10 Baseline:  Goal status: INITIAL  3.  Patient reports and demonstrates appropriate posture and alignment with re- to: office ergonomics with good position of LE's; avoid sitting with ankle/knee crossed; avoid standing with knees hyperextended  Baseline:  Goal status: INITIAL   LONG TERM GOALS: Target date: 09/08/2023   Decrease pain L knee by 80-100% allowing patient to return to all normal functional activities  Baseline:  Goal status: INITIAL  2.  5/5 strength bilat hips;knees; ankles  Baseline:  Goal status: INITIAL  3.  Improve single leg stance time on L LE to 10 or more seconds with  no upper limb support  Baseline:  Goal status: INITIAL  4.  Patient reports ability to sit at her desk for typical work day with minima to no knee pain Baseline: "almost constant" pulling pain Goal status: INITIAL  5.  Independent in HEP including aquatic therapy as indicated  Baseline:  Goal status: INITIAL  6.  Improve LEFS Left knee by 10-15 points  Baseline:  Goal status: INITIAL   PLAN:  PT FREQUENCY: 2x/week  PT DURATION: 8 weeks  PLANNED INTERVENTIONS: 97164- PT Re-evaluation, 97110-Therapeutic exercises, 97530- Therapeutic activity, 97112- Neuromuscular re-education, 97535- Self Care, 78295- Manual therapy, 97116- Gait training, Stair training, Taping, Dry Needling, and Joint mobilization  PLAN FOR NEXT SESSION: assess response to DN, continue hip and knee strength   Jinx Mourning, PT  07/21/2023, 9:54 AM

## 2023-07-21 ENCOUNTER — Encounter: Payer: Self-pay | Admitting: Physical Therapy

## 2023-07-21 ENCOUNTER — Ambulatory Visit: Admitting: Physical Therapy

## 2023-07-21 DIAGNOSIS — M1712 Unilateral primary osteoarthritis, left knee: Secondary | ICD-10-CM | POA: Diagnosis not present

## 2023-07-21 DIAGNOSIS — R29898 Other symptoms and signs involving the musculoskeletal system: Secondary | ICD-10-CM | POA: Diagnosis not present

## 2023-07-21 DIAGNOSIS — M25562 Pain in left knee: Secondary | ICD-10-CM | POA: Diagnosis not present

## 2023-07-21 DIAGNOSIS — M6281 Muscle weakness (generalized): Secondary | ICD-10-CM

## 2023-07-21 NOTE — Patient Instructions (Signed)

## 2023-07-27 NOTE — Therapy (Signed)
 OUTPATIENT PHYSICAL THERAPY LOWER EXTREMITYTREATMENT   Patient Name: Emily Mclean  JEFFIE WIDDOWSON MRN: [id number] DOB:Apr 02, 1970, 53 y.o., female Today's Date: 07/28/2023  END OF SESSION:  PT End of Session - 07/28/23 1316     Visit Number 4    Number of Visits 16    Date for PT Re-Evaluation 09/08/23    Authorization Type [employer] [place]    Authorization Time Period per year    Authorization - Visit Number 4    Authorization - Number of Visits 30    PT Start Time 1316    PT Stop Time 1357    PT Time Calculation (min) 41 min    Activity Tolerance Patient tolerated treatment well    Behavior During Therapy WFL for tasks assessed/performed                Past Medical History:  Diagnosis Date   Allergic rhinitis    Allergy    Chronic allergic conjunctivitis    Generalized abdominal pain 11/08/2018   GERD (gastroesophageal reflux disease)    Hallux valgus (acquired), left foot 03/28/2019   Heart murmur    Thyroid  disease    Urticaria due to cold and heat    Past Surgical History:  Procedure Laterality Date   MANDIBLE SURGERY     NASAL SINUS SURGERY     polyps removed   Patient Active Problem List   Diagnosis Date Noted   Primary osteoarthritis of left knee 07/08/2023   Allergic rhinitis 02/15/2020   Chronic allergic conjunctivitis 02/15/2020   Urticaria due to cold and heat 02/15/2020   Hypocalcemia 11/02/2019   Hypothyroidism 11/02/2019    PCP: Dr Mariel Shope  REFERRING PROVIDER: Dr Annemarie Kil   REFERRING DIAG: OA L knee   THERAPY DIAG:  Acute pain of left knee  Other symptoms and signs involving the musculoskeletal system  Muscle weakness (generalized)  Rationale for Evaluation and Treatment: Rehabilitation  ONSET DATE: 04/09/23  SUBJECTIVE:   SUBJECTIVE STATEMENT: Pain is worse because I was in [place]   PERTINENT HISTORY: Fracture of growth plate L ankle in 5th grade - walked on L LE without diagnosis for ~ 2 weeks. Xrays showed fx - casted  for 8 weeks NWB Denies any medical or musculoskeletal problems  PAIN:  Are you having pain? Yes: NPRS scale: 6/10  Pain location: pain in the lateral aspect of L knee - radiating into mid calf  Pain description: pressure; solid pain - intermittent mostly constant  Aggravating factors: sitting with knee bent; steps - worse at the end of day  Relieving factors: meds; Voltaren  gel; elevation at night - better in the morning   PRECAUTIONS: None  RED FLAGS: None   WEIGHT BEARING RESTRICTIONS: No  FALLS:  Has patient fallen in last 6 months? 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  DIAGNOSTIC FINDINGS: xray- showed mild tricompartmental osteoarthritis.   PATIENT SURVEYS:  LEFS 19/80; 23.8%  COGNITION: Overall cognitive  status: Within functional limits for tasks assessed     SENSATION: WFL  EDEMA:  No noticeable   MUSCLE LENGTH: Hamstrings: Right 75 deg; Left 70 deg Thomas test: Right 10 deg; Left neutral   POSTURE: rounded shoulders and increased thoracic kyphosis; stands with bilat knees hyperextended   PALPATION: Tenderness L knee - medial joint line; lateral head of the gastrocnemius; less through the distal lateral hamstring;   LOWER EXTREMITY ROM: WFL's - tight hip rotation L compared to R; bilat knee ROM equal and pain free   Active ROM Right eval Left eval  Hip flexion    Hip extension    Hip abduction    Hip adduction    Hip internal  rotation    Hip external rotation    Knee flexion    Knee extension    Ankle dorsiflexion    Ankle plantarflexion    Ankle inversion    Ankle eversion     (Blank rows = not tested)  LOWER EXTREMITY MMT:  MMT Right eval Left eval  Hip flexion 5 4  Hip extension 4+ 4  Hip abduction 5 4  Hip adduction    Hip internal rotation    Hip external rotation    Knee flexion 5 4+  Knee extension 5 5  Ankle dorsiflexion  Decreased compared to R; Tight  Ankle plantarflexion    Ankle inversion  Decreased compared to R; tight   Ankle eversion     (Blank rows = not tested)  FUNCTIONAL TESTS:  5 times sit to stand: 14.43 sec knees adducted  SLS R 10 sec; L 8 sec, more difficult  GAIT: Distance walked: 40 feet Assistive device utilized: None Level of assistance: Complete Independence Comments: WFL's; no notable limp L LE   TREATMENT DATE:  [facility] Adult PT Treatment:                                                DATE: 07/28/23 Therapeutic Activity: Prone on elbows and Prone press ups x 10 to assess low back involvement STM using large foam roller for quads and HS  Manual Therapy: UPA mobs to L lumbar Trigger Point Dry Needling  Subsequent Treatment: Instructions provided previously at initial dry needling treatment.   Patient Verbal Consent Given: Yes Education Handout Provided: Previously Provided Muscles Treated: L HS, peroneals, quads (RF, IM, VL) Electrical Stimulation Performed: No Treatment Response/Outcome: Utilized skilled palpation to identify bony landmarks and trigger points.  Able to illicit twitch response and muscle elongation.  Soft tissue mobilization to muscles needled to further promote tissue elongation and decreased pain.                                                                                                                        TREATMENT DATE:  [facility] Adult PT Treatment:  DATE: 07/21/23 Therapeutic  Exercise: Prone quad stretch with strap 2 x 1 min Neuromuscular re-ed: Quad set with towel under knee 5 sec hold 2 x 10 mild lateral pain SAQ 5 sec hold 2 x 10 - no pain SLR 2 x 10 -  SLR with ER 2 x 10 Prone HS curl 5# 2x10 S/L clams blue band x 20 B Hip hike on 6 inch step x 20  Therapeutic Activity: Squat tap to mat table 2x10 with blue loop at thighs Step down 4 inch 2x10 and 6 inch 1x 10 cues for alignment 4 " 1 x 10 and 6: inch fwd heel taps 2x10 B - feels in medial post joint line Manual Therapy: Trigger Point Dry Needling  Initial Treatment: Pt instructed on Dry Needling rational, procedures, and possible side effects. Pt instructed to expect mild to moderate muscle soreness later in the day and/or into the next day.  Pt instructed in methods to reduce muscle soreness. Pt instructed to continue prescribed HEP. Patient was educated on signs and symptoms of infection and other risk factors and advised to seek medical attention should they occur.  Patient verbalized understanding of these instructions and education.   Patient Verbal Consent Given: Yes Education Handout Provided: Yes Muscles Treated: L med and lateral gastroc, L peroneals Electrical Stimulation Performed: No Treatment Response/Outcome: Utilized skilled palpation to identify bony landmarks and trigger points.  Able to illicit twitch response and muscle elongation.  Soft tissue mobilization to muscles needled  following DN to further promote tissue elongation and decreased pain.      [facility] Adult PT Treatment:                                                DATE: 07/19/23 Therapeutic Exercise: Hip flexor stretch of EOB painful in low back so moved to standing with foot on stairs 2x30 sec HS stretch with strap x 1 min L ITB stretch with strap x 1 min L Gastroc stretch 2x30 sec L Soleus stretch2 x 30 sec L Seated fig 4 - reviewed - pt already doing at home Neuromuscular re-ed: Quad set with towel under knee 5 sec  hold 2 x 10 SAQ 5 sec hold 2 x 10 SLR 2 x 10 SLR with ER 2 x 10 HS set with feet on orange swiss ball 10 sec hold x 10 S/L clams green band x 20 B Standing hip ABD and ext at 45 deg green band 2 x 10 ea B Hip hike on 4 inch step Therapeutic Activity: Squat tap to mat chair 2x10 - some popping reported in knee Step down 4 inch 2x10 and 6 inch 1x 10 cues for alignment Step up 6 inch step x 20    07/14/23   Review of evaluation findings and POC Exercise instruction  as noted below   PATIENT EDUCATION:  Education details:  HEP update [phone]  Person educated: Patient Education method: Explanation, Demonstration, Tactile cues, Verbal cues, and Handouts Education comprehension: verbalized understanding, returned demonstration, verbal cues required, tactile cues required, and needs further education  HOME EXERCISE PROGRAM: Access Code: [email or web address] URL: [email or web address] Date: 07/21/2023 Prepared by: Concha Deed  Exercises - Hooklying Hamstring Stretch with Strap  - 2 x daily - 7 x weekly - 1 sets - 3 reps - 30 sec  hold - Supine ITB Stretch with Strap  -  2 x daily - 7 x weekly - 1 sets - 3 reps - 30 sec  hold - Prone Quadriceps Stretch with Strap (Mirrored)  - 2 x daily - 7 x weekly - 1 sets - 3 reps - 60 sec hold - Gastroc Stretch on Wall  - 2 x daily - 7 x weekly - 1 sets - 3 reps - 30 sec  hold - Soleus Stretch on Wall  - 2 x daily - 7 x weekly - 1 sets - 3 reps - 30 sec  hold - Seated Hamstring Stretch  - 2 x daily - 7 x weekly - 1 sets - 3 reps - 30 sec  hold - Hip Flexor Stretch on Step  - 2 x daily - 7 x weekly - 1 sets - 3 reps - 30-60 sec hold - Clam with Resistance  - 1 x daily - 3 x weekly - 2-3 sets - 10 reps - Standing Hip Abduction with Resistance at Ankles and Counter Support  - 1 x daily - 3 x weekly - 2 sets - 10 reps - Standing Hip Extension with Resistance at THIGHS and Counter Support  - 1 x daily - 3 x weekly - 2 sets - 10 reps - Squat with Chair Touch  and Resistance Loop  - 1 x daily - 3 x weekly - 2 sets - 10 reps - Forward Step Down Touch with Heel  - 1 x daily - 3 x weekly - 2 sets - 10 reps  ASSESSMENT:  CLINICAL IMPRESSION: Patient reports increased pain to 6/10 in proximal lateral lower leg since traveling to [place] earlier this week. She applied Voltaren  before PT, so down to 2/10 upon arrival. Assessed low back for involvement, but no change to sx. Patient with very tight HS and Quads with trigger points present in proximal HS and mid and lateral quads. Excellent response to DN with immediate decrease in tissue tension and no TTP of peroneals at end of session.   EVAL: Patient is a 53 y.o. female who was seen today for physical therapy evaluation and treatment for OA L knee. She reports L knee with no known injury. Symptoms have been present over the past 2-3 months with no known injury. She does report that she had a fracture of L ankle in 5th grade with delayed diagnosis and subsequent casting x 8 weeks NWB. Ankle fracture resolved with no further trouble. Of note, patient has decreased L ankle and hip mobility; poor tracking of L patella, decreased strength L LE; pain with sitting at her desk and walking, especially stairs. Patient stands with bilat knees hyperextended. She has pain with functional activities especially when sitting at her desk for extended periods of time. Patient will benefit from PT to address deficits identified.   OBJECTIVE IMPAIRMENTS: decreased activity tolerance, decreased balance, decreased strength, impaired flexibility, improper body mechanics, and pain.   ACTIVITY LIMITATIONS: bending, sitting, standing, squatting, and stairs  PARTICIPATION LIMITATIONS: driving, community activity, and occupation  PERSONAL FACTORS: Behavior pattern, Fitness, Past/current experiences, and Profession are also affecting patient's functional outcome.   REHAB POTENTIAL: Good  CLINICAL DECISION MAKING: Evolving/moderate  complexity  EVALUATION COMPLEXITY: Moderate   GOALS: Goals reviewed with patient? Yes  SHORT TERM GOALS: Target date: 08/11/2023   Independent in initial HEP  Baseline: Goal status: INITIAL  2.  Patient to report sitting at her desk for 2 hours without experencing pain =/> 1/10 Baseline:  Goal status: INITIAL  3.  Patient reports  and demonstrates appropriate posture and alignment with re- to: office ergonomics with good position of LE's; avoid sitting with ankle/knee crossed; avoid standing with knees hyperextended  Baseline:  Goal status: INITIAL   LONG TERM GOALS: Target date: 09/08/2023   Decrease pain L knee by 80-100% allowing patient to return to all normal functional activities  Baseline:  Goal status: INITIAL  2.  5/5 strength bilat hips;knees; ankles  Baseline:  Goal status: INITIAL  3.  Improve single leg stance time on L LE to 10 or more seconds with no upper limb support  Baseline:  Goal status: INITIAL  4.  Patient reports ability to sit at her desk for typical work day with minima to no knee pain Baseline: "almost constant" pulling pain Goal status: INITIAL  5.  Independent in HEP including aquatic therapy as indicated  Baseline:  Goal status: INITIAL  6.  Improve LEFS Left knee by 10-15 points  Baseline:  Goal status: INITIAL   PLAN:  PT FREQUENCY: 2x/week  PT DURATION: 8 weeks  PLANNED INTERVENTIONS: 97164- PT Re-evaluation, 97110-Therapeutic exercises, 97530- Therapeutic activity, 97112- Neuromuscular re-education, 97535- Self Care, 16109- Manual therapy, 97116- Gait training, Stair training, Taping, Dry Needling, and Joint mobilization  PLAN FOR NEXT SESSION: assess response to DN, continue hip and knee strength   Jinx Mourning, PT  07/28/2023, 2:08 PM

## 2023-07-28 ENCOUNTER — Encounter: Payer: Self-pay | Admitting: Physical Therapy

## 2023-07-28 ENCOUNTER — Ambulatory Visit: Admitting: Physical Therapy

## 2023-07-28 DIAGNOSIS — R29898 Other symptoms and signs involving the musculoskeletal system: Secondary | ICD-10-CM | POA: Diagnosis not present

## 2023-07-28 DIAGNOSIS — M6281 Muscle weakness (generalized): Secondary | ICD-10-CM | POA: Diagnosis not present

## 2023-07-28 DIAGNOSIS — M25562 Pain in left knee: Secondary | ICD-10-CM

## 2023-07-28 DIAGNOSIS — M1712 Unilateral primary osteoarthritis, left knee: Secondary | ICD-10-CM | POA: Diagnosis not present

## 2023-08-02 ENCOUNTER — Ambulatory Visit: Admitting: Rehabilitative and Restorative Service Providers"

## 2023-08-04 ENCOUNTER — Encounter: Payer: Self-pay | Admitting: Rehabilitative and Restorative Service Providers"

## 2023-08-04 ENCOUNTER — Ambulatory Visit: Admitting: Rehabilitative and Restorative Service Providers"

## 2023-08-04 DIAGNOSIS — M1712 Unilateral primary osteoarthritis, left knee: Secondary | ICD-10-CM | POA: Diagnosis not present

## 2023-08-04 DIAGNOSIS — M25562 Pain in left knee: Secondary | ICD-10-CM

## 2023-08-04 DIAGNOSIS — R29898 Other symptoms and signs involving the musculoskeletal system: Secondary | ICD-10-CM

## 2023-08-04 DIAGNOSIS — M6281 Muscle weakness (generalized): Secondary | ICD-10-CM | POA: Diagnosis not present

## 2023-08-04 NOTE — Therapy (Signed)
 OUTPATIENT PHYSICAL THERAPY LOWER EXTREMITYTREATMENT   Patient Name: Emily Mclean  ASHIKA APUZZO MRN: [id number] DOB:July 01, 1970, 53 y.o., female Today's Date: 08/04/2023  END OF SESSION:  PT End of Session - 08/04/23 1619     Visit Number 5    Number of Visits 16    Date for PT Re-Evaluation 09/08/23    Authorization Type [employer] [place]    Authorization Time Period per year    Authorization - Visit Number 5    PT Start Time 1615    PT Stop Time 1700    PT Time Calculation (min) 45 min    Behavior During Therapy WFL for tasks assessed/performed                Past Medical History:  Diagnosis Date   Allergic rhinitis    Allergy    Chronic allergic conjunctivitis    Generalized abdominal pain 11/08/2018   GERD (gastroesophageal reflux disease)    Hallux valgus (acquired), left foot 03/28/2019   Heart murmur    Thyroid  disease    Urticaria due to cold and heat    Past Surgical History:  Procedure Laterality Date   MANDIBLE SURGERY     NASAL SINUS SURGERY     polyps removed   Patient Active Problem List   Diagnosis Date Noted   Primary osteoarthritis of left knee 07/08/2023   Allergic rhinitis 02/15/2020   Chronic allergic conjunctivitis 02/15/2020   Urticaria due to cold and heat 02/15/2020   Hypocalcemia 11/02/2019   Hypothyroidism 11/02/2019    PCP: Dr Mariel Shope  REFERRING PROVIDER: Dr Annemarie Kil   REFERRING DIAG: OA L knee   THERAPY DIAG:  Acute pain of left knee  Other symptoms and signs involving the musculoskeletal system  Muscle weakness (generalized)  Rationale for Evaluation and Treatment: Rehabilitation  ONSET DATE: 04/09/23  SUBJECTIVE:   SUBJECTIVE STATEMENT: Pain is is so much better since last treatment. She had some soreness following dry needling for a couple of days but she has not had pain. She has not used Voltaren  gel in the past 5 days. Working on the exercises. Difficulty with the foam roller along the outside of her  thigh. It's painful.  PERTINENT HISTORY: Fracture of growth plate L ankle in 5th grade - walked on L LE without diagnosis for ~ 2 weeks. Xrays showed fx - casted for 8 weeks NWB Denies any medical or musculoskeletal problems  PAIN:  Are you having pain? Yes: NPRS scale: 0/10  Pain location: pain in the lateral aspect of L knee - radiating into mid calf  Pain description: pressure; solid pain - intermittent mostly constant  Aggravating factors: sitting with knee bent; steps - worse at the end of day  Relieving factors: meds; Voltaren  gel; elevation at night - better in the morning   PRECAUTIONS: None  RED FLAGS: None   WEIGHT BEARING RESTRICTIONS: No  FALLS:  Has patient fallen in last 6 months? 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  DIAGNOSTIC FINDINGS: xray- showed mild tricompartmental osteoarthritis.   PATIENT SURVEYS:  LEFS 19/80; 23.8%  SENSATION: WFL  EDEMA:  No noticeable   MUSCLE LENGTH: Hamstrings: Right 75 deg; Left 70 deg Thomas test: Right 10 deg; Left neutral   POSTURE: rounded shoulders and increased thoracic kyphosis; stands with bilat knees hyperextended   PALPATION: Tenderness L knee - medial joint line; lateral head of the gastrocnemius; less through the distal lateral hamstring;   LOWER EXTREMITY ROM: WFL's - tight hip rotation L compared to R; bilat knee ROM equal and pain free   Active ROM Right eval Left eval  Hip flexion    Hip  extension    Hip abduction    Hip adduction    Hip internal rotation    Hip external rotation    Knee flexion    Knee extension    Ankle dorsiflexion    Ankle plantarflexion    Ankle inversion    Ankle eversion     (Blank rows = not tested)  LOWER EXTREMITY MMT:  MMT Right eval Left eval  Hip flexion 5 4  Hip extension 4+ 4  Hip abduction 5 4  Hip adduction    Hip internal rotation    Hip external rotation    Knee flexion 5 4+  Knee extension 5 5  Ankle dorsiflexion  Decreased compared to R; Tight  Ankle plantarflexion    Ankle inversion  Decreased compared to R; tight   Ankle eversion     (Blank rows = not tested)  FUNCTIONAL TESTS:  5 times sit to stand: 14.43 sec knees adducted  SLS R 10 sec; L 8 sec, more difficult  GAIT: Distance walked: 40 feet Assistive device utilized: None Level of assistance: Complete Independence Comments: WFL's; no notable limp L LE   [facility] Adult PT Treatment:                                                DATE: 08/04/23 Therapeutic Exercise: Nustep L 5 x 5 min  Supine hamstring stretch with addition of ankle eversion stretch w/ strap 30 sec x 3  Manual therapy:   STM/IASTM L lateral leg through peroneals Passive stretch into ankle inversion 10-15 sec hold x 3 PT assist   Neuromuscular re-ed: Toe yoga  Foot arching   Therapeutic Activity: Ankle eversion yellow TB 3 sec x 10  Isometric ankle eversion holding yellow TB on L and moving R ankle into eversion 3 sec x 10  Foam rolling along the lateral thigh using 5 inch green noodle - much better tolerated than larger white foam roller  Massage stick lateral thigh, quads - not effective on lateral leg    TREATMENT DATE:  [facility] Adult PT Treatment:                                                DATE: 07/28/23 Therapeutic Activity: Prone on elbows and Prone press ups x 10 to assess low back involvement STM using large foam roller for quads and HS  Manual Therapy: UPA mobs to L  lumbar Trigger Point Dry Needling  Subsequent Treatment: Instructions provided previously at initial dry needling treatment.   Patient  Verbal Consent Given: Yes Education Handout Provided: Previously Provided Muscles Treated: L HS, peroneals, quads (RF, IM, VL) Electrical Stimulation Performed: No Treatment Response/Outcome: Utilized skilled palpation to identify bony landmarks and trigger points.  Able to illicit twitch response and muscle elongation.  Soft tissue mobilization to muscles needled to further promote tissue elongation and decreased pain.                                                                                                                        TREATMENT DATE:  [facility] Adult PT Treatment:                                                DATE: 07/21/23 Therapeutic Exercise: Prone quad stretch with strap 2 x 1 min Neuromuscular re-ed: Quad set with towel under knee 5 sec hold 2 x 10 mild lateral pain SAQ 5 sec hold 2 x 10 - no pain SLR 2 x 10 -  SLR with ER 2 x 10 Prone HS curl 5# 2x10 S/L clams blue band x 20 B Hip hike on 6 inch step x 20  Therapeutic Activity: Squat tap to mat table 2x10 with blue loop at thighs Step down 4 inch 2x10 and 6 inch 1x 10 cues for alignment 4 " 1 x 10 and 6: inch fwd heel taps 2x10 B - feels in medial post joint line Manual Therapy: Trigger Point Dry Needling  Initial Treatment: Pt instructed on Dry Needling rational, procedures, and possible side effects. Pt instructed to expect mild to moderate muscle soreness later in the day and/or into the next day.  Pt instructed in methods to reduce muscle soreness. Pt instructed to continue prescribed HEP. Patient was educated on signs and symptoms of infection and other risk factors and advised to seek medical attention should they occur.  Patient verbalized understanding of these instructions and education.   Patient Verbal Consent Given: Yes Education Handout Provided: Yes Muscles  Treated: L med and lateral gastroc, L peroneals Electrical Stimulation Performed: No Treatment Response/Outcome: Utilized skilled palpation to identify bony landmarks and trigger points.  Able to illicit twitch response and muscle elongation.  Soft tissue mobilization to muscles needled  following DN to further promote tissue elongation and decreased pain.      [facility] Adult PT Treatment:                                                DATE: 07/19/23 Therapeutic Exercise: Hip flexor stretch of EOB painful in low back so moved to standing with foot on stairs 2x30 sec HS stretch with strap x 1 min L ITB stretch with strap x 1 min L Gastroc stretch 2x30 sec L Soleus stretch2  x 30 sec L Seated fig 4 - reviewed - pt already doing at home Neuromuscular re-ed: Quad set with towel under knee 5 sec hold 2 x 10 SAQ 5 sec hold 2 x 10 SLR 2 x 10 SLR with ER 2 x 10 HS set with feet on orange swiss ball 10 sec hold x 10 S/L clams green band x 20 B Standing hip ABD and ext at 45 deg green band 2 x 10 ea B Hip hike on 4 inch step Therapeutic Activity: Squat tap to mat chair 2x10 - some popping reported in knee Step down 4 inch 2x10 and 6 inch 1x 10 cues for alignment Step up 6 inch step x 20    07/14/23   Review of evaluation findings and POC Exercise instruction  as noted below   PATIENT EDUCATION:  Education details:  HEP update [phone]  Person educated: Patient Education method: Explanation, Demonstration, Tactile cues, Verbal cues, and Handouts Education comprehension: verbalized understanding, returned demonstration, verbal cues required, tactile cues required, and needs further education  HOME EXERCISE PROGRAM: Access Code: [email or web address] URL: [email or web address] Date: 08/04/2023 Prepared by: Sherry Rogus  Exercises - Hooklying Hamstring Stretch with Strap  - 2 x daily - 7 x weekly - 1 sets - 3 reps - 30 sec  hold - Supine ITB Stretch with Strap  - 2 x daily - 7 x weekly - 1  sets - 3 reps - 30 sec  hold - Prone Quadriceps Stretch with Strap (Mirrored)  - 2 x daily - 7 x weekly - 1 sets - 3 reps - 60 sec hold - Gastroc Stretch on Wall  - 2 x daily - 7 x weekly - 1 sets - 3 reps - 30 sec  hold - Soleus Stretch on Wall  - 2 x daily - 7 x weekly - 1 sets - 3 reps - 30 sec  hold - Seated Hamstring Stretch  - 2 x daily - 7 x weekly - 1 sets - 3 reps - 30 sec  hold - Hip Flexor Stretch on Step  - 2 x daily - 7 x weekly - 1 sets - 3 reps - 30-60 sec hold - Clam with Resistance  - 1 x daily - 3 x weekly - 2-3 sets - 10 reps - Standing Hip Abduction with Resistance at Ankles and Counter Support  - 1 x daily - 3 x weekly - 2 sets - 10 reps - Standing Hip Extension with Resistance at THIGHS and Counter Support  - 1 x daily - 3 x weekly - 2 sets - 10 reps - Squat with Chair Touch and Resistance Loop  - 1 x daily - 3 x weekly - 2 sets - 10 reps - Forward Step Down Touch with Heel  - 1 x daily - 3 x weekly - 2 sets - 10 reps - Ankle Inversion Stretch with Caregiver  - 1 x daily - 7 x weekly - 1 sets - 3 reps - 10-15 sec  hold - Long Sitting Isometric Ankle Eversion in Dorsiflexion with Ball at Wall  - 1 x daily - 7 x weekly - 1-2 sets - 10 reps - 3 sec  hold - Ankle Eversion with Resistance  - 1 x daily - 7 x weekly - 1-2 sets - 10 reps - 3 sec  hold - Toe Yoga - Alternating Great Toe and Lesser Toe Extension  - 2 x daily - 7 x weekly - 1 sets - 10 reps -  3 sec  hold - Seated Arch Lifts  - 1 x daily - 7 x weekly - 1-2 sets - 10 reps - 3 sec  hold  ASSESSMENT:  CLINICAL IMPRESSION: Patient reports that she has had no pain since the needling. Had some soreness but no pain in the past 5 days. She is working on exercises with no pain or problems. Address tightness in peroneals with STM/IASTM and stretching with some noted release of muscle banding in the peroneals. Added light resistive ankle eversion strengthening exercises and active exercises for the foot and toes. Excellent  response to DN.   EVAL: Patient is a 53 y.o. female who was seen today for physical therapy evaluation and treatment for OA L knee. She reports L knee with no known injury. Symptoms have been present over the past 2-3 months with no known injury. She does report that she had a fracture of L ankle in 5th grade with delayed diagnosis and subsequent casting x 8 weeks NWB. Ankle fracture resolved with no further trouble. Of note, patient has decreased L ankle and hip mobility; poor tracking of L patella, decreased strength L LE; pain with sitting at her desk and walking, especially stairs. Patient stands with bilat knees hyperextended. She has pain with functional activities especially when sitting at her desk for extended periods of time. Patient will benefit from PT to address deficits identified.   OBJECTIVE IMPAIRMENTS: decreased activity tolerance, decreased balance, decreased strength, impaired flexibility, improper body mechanics, and pain.   GOALS: Goals reviewed with patient? Yes  SHORT TERM GOALS: Target date: 08/11/2023   Independent in initial HEP  Baseline: Goal status: INITIAL  2.  Patient to report sitting at her desk for 2 hours without experencing pain =/> 1/10 Baseline:  Goal status: INITIAL  3.  Patient reports and demonstrates appropriate posture and alignment with re- to: office ergonomics with good position of LE's; avoid sitting with ankle/knee crossed; avoid standing with knees hyperextended  Baseline:  Goal status: INITIAL   LONG TERM GOALS: Target date: 09/08/2023   Decrease pain L knee by 80-100% allowing patient to return to all normal functional activities  Baseline:  Goal status: INITIAL  2.  5/5 strength bilat hips;knees; ankles  Baseline:  Goal status: INITIAL  3.  Improve single leg stance time on L LE to 10 or more seconds with no upper limb support  Baseline:  Goal status: INITIAL  4.  Patient reports ability to sit at her desk for typical work day  with minima to no knee pain Baseline: "almost constant" pulling pain Goal status: INITIAL  5.  Independent in HEP including aquatic therapy as indicated  Baseline:  Goal status: INITIAL  6.  Improve LEFS Left knee by 10-15 points  Baseline:  Goal status: INITIAL   PLAN:  PT FREQUENCY: 2x/week  PT DURATION: 8 weeks  PLANNED INTERVENTIONS: 97164- PT Re-evaluation, 97110-Therapeutic exercises, 97530- Therapeutic activity, 97112- Neuromuscular re-education, 97535- Self Care, 16109- Manual therapy, 97116- Gait training, Stair training, Taping, Dry Needling, and Joint mobilization  PLAN FOR NEXT SESSION: continue DN as needed, continue hip and knee strength; add ankle strengthening and balance activities focus on L LE    Tammie Ellsworth P. Geraldo Klippel PT, MPH 08/04/23 4:22 PM

## 2023-08-09 ENCOUNTER — Encounter: Admitting: Rehabilitative and Restorative Service Providers"

## 2023-08-10 NOTE — Therapy (Signed)
 OUTPATIENT PHYSICAL THERAPY LOWER EXTREMITYTREATMENT   Patient Name: Emily Mclean MRN: [id number] DOB:01-24-1971, 53 y.o., female Today's Date: 08/11/2023  END OF SESSION:  PT End of Session - 08/11/23 1019     Visit Number 6    Number of Visits 16    Date for PT Re-Evaluation 09/08/23    Authorization Type [employer] [place]    Authorization Time Period per year    Authorization - Visit Number 6    Authorization - Number of Visits 30    PT Start Time 1020    PT Stop Time 1103    PT Time Calculation (min) 43 min    Activity Tolerance Patient tolerated treatment well    Behavior During Therapy WFL for tasks assessed/performed                 Past Medical History:  Diagnosis Date   Allergic rhinitis    Allergy    Chronic allergic conjunctivitis    Generalized abdominal pain 11/08/2018   GERD (gastroesophageal reflux disease)    Hallux valgus (acquired), left foot 03/28/2019   Heart murmur    Thyroid  disease    Urticaria due to cold and heat    Past Surgical History:  Procedure Laterality Date   MANDIBLE SURGERY     NASAL SINUS SURGERY     polyps removed   Patient Active Problem List   Diagnosis Date Noted   Primary osteoarthritis of left knee 07/08/2023   Allergic rhinitis 02/15/2020   Chronic allergic conjunctivitis 02/15/2020   Urticaria due to cold and heat 02/15/2020   Hypocalcemia 11/02/2019   Hypothyroidism 11/02/2019    PCP: Dr Mariel Shope  REFERRING PROVIDER: Dr Annemarie Kil   REFERRING DIAG: OA L knee   THERAPY DIAG:  Acute pain of left knee  Other symptoms and signs involving the musculoskeletal system  Muscle weakness (generalized)  Rationale for Evaluation and Treatment: Rehabilitation  ONSET DATE: 04/09/23  SUBJECTIVE:   SUBJECTIVE STATEMENT: Two days ago the pain started back in mid quad and lateral knee. I have used Votaren last night.  I have been under stress as well because my daughter had jaw surgery.    PERTINENT HISTORY: Fracture of growth plate L ankle in 5th grade - walked on L LE without diagnosis for ~ 2 weeks. Xrays showed fx - casted for 8 weeks NWB Denies any medical or musculoskeletal problems  PAIN:  Are you having pain? Yes: NPRS scale:4 /10  Pain location: pain in the lateral aspect of L knee - radiating into mid calf  Pain description: pressure; solid pain - intermittent mostly constant  Aggravating factors: sitting with knee bent; steps - worse at the end of day  Relieving factors: meds; Voltaren  gel; elevation at night - better in the morning   PRECAUTIONS: None  RED FLAGS: None   WEIGHT BEARING RESTRICTIONS: No  FALLS:  Has patient fallen in last 6 months? 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  DIAGNOSTIC FINDINGS: xray- showed mild tricompartmental osteoarthritis.   PATIENT SURVEYS:  LEFS 19/80; 23.8%  SENSATION: WFL  EDEMA:  No noticeable   MUSCLE LENGTH: Hamstrings: Right 75 deg; Left 70 deg Thomas test: Right 10 deg; Left neutral   POSTURE: rounded shoulders and increased thoracic kyphosis; stands with bilat knees hyperextended   PALPATION: Tenderness L knee - medial joint line; lateral head of the gastrocnemius; less through the distal lateral hamstring;   LOWER EXTREMITY ROM: WFL's - tight hip rotation L compared to R; bilat knee ROM equal and pain free   Active ROM Right eval Left eval  Hip flexion    Hip extension    Hip abduction     Hip adduction    Hip internal rotation    Hip external rotation    Knee flexion    Knee extension    Ankle dorsiflexion    Ankle plantarflexion    Ankle inversion    Ankle eversion     (Blank rows = not tested)  LOWER EXTREMITY MMT:  MMT Right eval Left eval Left 08/11/23  Hip flexion 5 4 4+  Hip extension 4+ 4 4+  Hip abduction 5 4 5   Hip adduction   4+  Hip internal rotation     Hip external rotation     Knee flexion 5 4+ 5  Knee extension 5 5 5   Ankle dorsiflexion  Decreased compared to R; Tight   Ankle plantarflexion     Ankle inversion  Decreased compared to R; tight    Ankle eversion      (Blank rows = not tested)  FUNCTIONAL TESTS:  5 times sit to stand: 14.43 sec knees adducted  SLS R 10 sec; L 8 sec, more difficult  GAIT: Distance walked: 40 feet Assistive device utilized: None Level of assistance: Complete Independence Comments: WFL's; no notable limp L LE   [facility] Adult PT Treatment:                                                DATE: 6/525 Therapeutic Exercise: Nustep L 5 x 2 min  - did not tolerate due to knee pain Prone press ups 2x10 SLR x 10 SLR with ER x 10  Manual therapy:  PA and UPA mobs to B lumbar  Trigger Point Dry Needling  Subsequent Treatment: Instructions provided previously at initial dry needling treatment.   Patient Verbal Consent Given: Yes Education Handout Provided: Previously Provided Muscles Treated: L lateral gastroc, peroneals, VL, VM, RF and IM Electrical Stimulation Performed: No Treatment Response/Outcome: Utilized skilled palpation to identify bony landmarks and trigger points.  Able to illicit twitch response and muscle elongation.  Soft tissue mobilization to muscles needled to further promote tissue elongation and decreased pain.       [facility] Adult PT Treatment:                                                DATE: 08/04/23 Therapeutic Exercise: Nustep L 5 x 5 min  Supine hamstring stretch with addition of ankle  eversion stretch w/ strap 30 sec x 3  Manual therapy:   STM/IASTM L lateral leg through peroneals Passive stretch into ankle inversion  10-15 sec hold x 3 PT assist   Neuromuscular re-ed: Toe yoga  Foot arching   Therapeutic Activity: Ankle eversion yellow TB 3 sec x 10  Isometric ankle eversion holding yellow TB on L and moving R ankle into eversion 3 sec x 10  Foam rolling along the lateral thigh using 5 inch green noodle - much better tolerated than larger white foam roller  Massage stick lateral thigh, quads - not effective on lateral leg    TREATMENT DATE:  [facility] Adult PT Treatment:                                                DATE: 07/28/23 Therapeutic Activity: Prone on elbows and Prone press ups x 10 to assess low back involvement STM using large foam roller for quads and HS  Manual Therapy: UPA mobs to L lumbar Trigger Point Dry Needling  Subsequent Treatment: Instructions provided previously at initial dry needling treatment.   Patient Verbal Consent Given: Yes Education Handout Provided: Previously Provided Muscles Treated: L HS, peroneals, quads (RF, IM, VL) Electrical Stimulation Performed: No Treatment Response/Outcome: Utilized skilled palpation to identify bony landmarks and trigger points.  Able to illicit twitch response and muscle elongation.  Soft tissue mobilization to muscles needled to further promote tissue elongation and decreased pain.                                                                                                                        TREATMENT DATE:  [facility] Adult PT Treatment:                                                DATE: 07/21/23 Therapeutic Exercise: Prone quad stretch with strap 2 x 1 min Neuromuscular re-ed: Quad set with towel under knee 5 sec hold 2 x 10 mild lateral pain SAQ 5 sec hold 2 x 10 - no pain SLR 2 x 10 -  SLR with ER 2 x 10 Prone HS curl 5# 2x10 S/L clams blue band x 20 B Hip hike on 6 inch step x  20  Therapeutic Activity: Squat tap to mat table 2x10 with blue loop at thighs Step down 4 inch 2x10 and 6 inch 1x 10 cues for alignment 4 " 1 x 10 and 6: inch fwd heel taps 2x10 B - feels in medial post joint line Manual Therapy: Trigger Point Dry Needling  Initial Treatment: Pt instructed on Dry Needling rational, procedures, and possible side effects. Pt instructed to expect mild to moderate muscle soreness later in the day and/or into the next day.  Pt instructed in methods to reduce muscle soreness. Pt instructed to continue prescribed HEP. Patient was educated on signs and  symptoms of infection and other risk factors and advised to seek medical attention should they occur.  Patient verbalized understanding of these instructions and education.   Patient Verbal Consent Given: Yes Education Handout Provided: Yes Muscles Treated: L med and lateral gastroc, L peroneals Electrical Stimulation Performed: No Treatment Response/Outcome: Utilized skilled palpation to identify bony landmarks and trigger points.  Able to illicit twitch response and muscle elongation.  Soft tissue mobilization to muscles needled  following DN to further promote tissue elongation and decreased pain.      [facility] Adult PT Treatment:                                                DATE: 07/19/23 Therapeutic Exercise: Hip flexor stretch of EOB painful in low back so moved to standing with foot on stairs 2x30 sec HS stretch with strap x 1 min L ITB stretch with strap x 1 min L Gastroc stretch 2x30 sec L Soleus stretch2 x 30 sec L Seated fig 4 - reviewed - pt already doing at home Neuromuscular re-ed: Quad set with towel under knee 5 sec hold 2 x 10 SAQ 5 sec hold 2 x 10 SLR 2 x 10 SLR with ER 2 x 10 HS set with feet on orange swiss ball 10 sec hold x 10 S/L clams green band x 20 B Standing hip ABD and ext at 45 deg green band 2 x 10 ea B Hip hike on 4 inch step Therapeutic Activity: Squat tap to mat chair  2x10 - some popping reported in knee Step down 4 inch 2x10 and 6 inch 1x 10 cues for alignment Step up 6 inch step x 20    07/14/23   Review of evaluation findings and POC Exercise instruction  as noted below   PATIENT EDUCATION:  Education details:  HEP update [phone]  Person educated: Patient Education method: Explanation, Demonstration, Tactile cues, Verbal cues, and Handouts Education comprehension: verbalized understanding, returned demonstration, verbal cues required, tactile cues required, and needs further education  HOME EXERCISE PROGRAM: Access Code: [email or web address] URL: [email or web address] Date: 08/04/2023 Prepared by: Celyn Holt  Exercises - Hooklying Hamstring Stretch with Strap  - 2 x daily - 7 x weekly - 1 sets - 3 reps - 30 sec  hold - Supine ITB Stretch with Strap  - 2 x daily - 7 x weekly - 1 sets - 3 reps - 30 sec  hold - Prone Quadriceps Stretch with Strap (Mirrored)  - 2 x daily - 7 x weekly - 1 sets - 3 reps - 60 sec hold - Gastroc Stretch on Wall  - 2 x daily - 7 x weekly - 1 sets - 3 reps - 30 sec  hold - Soleus Stretch on Wall  - 2 x daily - 7 x weekly - 1 sets - 3 reps - 30 sec  hold - Seated Hamstring Stretch  - 2 x daily - 7 x weekly - 1 sets - 3 reps - 30 sec  hold - Hip Flexor Stretch on Step  - 2 x daily - 7 x weekly - 1 sets - 3 reps - 30-60 sec hold - Clam with Resistance  - 1 x daily - 3 x weekly - 2-3 sets - 10 reps - Standing Hip Abduction with Resistance at Ankles and Counter Support  - 1 x daily -  3 x weekly - 2 sets - 10 reps - Standing Hip Extension with Resistance at THIGHS and Counter Support  - 1 x daily - 3 x weekly - 2 sets - 10 reps - Squat with Chair Touch and Resistance Loop  - 1 x daily - 3 x weekly - 2 sets - 10 reps - Forward Step Down Touch with Heel  - 1 x daily - 3 x weekly - 2 sets - 10 reps - Ankle Inversion Stretch with Caregiver  - 1 x daily - 7 x weekly - 1 sets - 3 reps - 10-15 sec  hold - Long Sitting Isometric  Ankle Eversion in Dorsiflexion with Ball at Wall  - 1 x daily - 7 x weekly - 1-2 sets - 10 reps - 3 sec  hold - Ankle Eversion with Resistance  - 1 x daily - 7 x weekly - 1-2 sets - 10 reps - 3 sec  hold - Toe Yoga - Alternating Great Toe and Lesser Toe Extension  - 2 x daily - 7 x weekly - 1 sets - 10 reps - 3 sec  hold - Seated Arch Lifts  - 1 x daily - 7 x weekly - 1-2 sets - 10 reps - 3 sec  hold  ASSESSMENT:  CLINICAL IMPRESSION: Patient reports return of left knee pain 2 days ago of unknown origin although she has been under a lot of stress. She also notices when her knee starts hurting her back hurts as well. She has tightness and some discomfort at left L3/4 and tightness in R lumbar. She reports relief with mobilizations. It is unclear at this time if the knee pain is coming from her back. Prone press ups were added to her HEP to see if pain is affected at all. She had excellent responses to DN in all muscles needled and reported no pain at the end of session. Her L hip and knee strength has improved since evaluation and she has no pain with resisted knee motion. Plan to add core strengthening next visit and also go over body mechanics. She continues to demonstrate potential for improvement and would benefit from continued skilled therapy to address impairments.     EVAL: Patient is a 53 y.o. female who was seen today for physical therapy evaluation and treatment for OA L knee. She reports L knee with no known injury. Symptoms have been present over the past 2-3 months with no known injury. She does report that she had a fracture of L ankle in 5th grade with delayed diagnosis and subsequent casting x 8 weeks NWB. Ankle fracture resolved with no further trouble. Of note, patient has decreased L ankle and hip mobility; poor tracking of L patella, decreased strength L LE; pain with sitting at her desk and walking, especially stairs. Patient stands with bilat knees hyperextended. She has pain with  functional activities especially when sitting at her desk for extended periods of time. Patient will benefit from PT to address deficits identified.   OBJECTIVE IMPAIRMENTS: decreased activity tolerance, decreased balance, decreased strength, impaired flexibility, improper body mechanics, and pain.   GOALS: Goals reviewed with patient? Yes  SHORT TERM GOALS: Target date: 08/11/2023   Independent in initial HEP  Baseline: Goal status: MET  2.  Patient to report sitting at her desk for 2 hours without experencing pain =/> 1/10 Baseline:  Goal status: IN PROGRESSS  3.  Patient reports and demonstrates appropriate posture and alignment with re- to: office ergonomics  with good position of LE's; avoid sitting with ankle/knee crossed; avoid standing with knees hyperextended  Baseline:  Goal status: INITIAL   LONG TERM GOALS: Target date: 09/08/2023   Decrease pain L knee by 80-100% allowing patient to return to all normal functional activities  Baseline:  Goal status: IN PROGRESS 6/5  2.  5/5 strength bilat hips;knees; ankles  Baseline:  Goal status: IN PROGRESS 6/5  3.  Improve single leg stance time on L LE to 10 or more seconds with no upper limb support  Baseline:  Goal status: INITIAL  4.  Patient reports ability to sit at her desk for typical work day with minima to no knee pain Baseline: "almost constant" pulling pain Goal status: INITIAL  5.  Independent in HEP including aquatic therapy as indicated  Baseline:  Goal status: INITIAL  6.  Improve LEFS Left knee by 10-15 points  Baseline:  Goal status: INITIAL   PLAN:  PT FREQUENCY: 2x/week  PT DURATION: 8 weeks  PLANNED INTERVENTIONS: 97164- PT Re-evaluation, 97110-Therapeutic exercises, 97530- Therapeutic activity, 97112- Neuromuscular re-education, 97535- Self Care, 16109- Manual therapy, 97116- Gait training, Stair training, Taping, Dry Needling, and Joint mobilization  PLAN FOR NEXT SESSION: continue DN as  needed, add core strengthening next visit and also go over body mechanics. continue hip and knee strength; add ankle strengthening and balance activities focus on L LE    Jinx Mourning, PT  08/11/23 11:22 AM

## 2023-08-11 ENCOUNTER — Encounter: Payer: Self-pay | Admitting: Physical Therapy

## 2023-08-11 ENCOUNTER — Ambulatory Visit: Attending: Sports Medicine | Admitting: Physical Therapy

## 2023-08-11 DIAGNOSIS — M6281 Muscle weakness (generalized): Secondary | ICD-10-CM | POA: Diagnosis present

## 2023-08-11 DIAGNOSIS — M25562 Pain in left knee: Secondary | ICD-10-CM | POA: Diagnosis not present

## 2023-08-11 DIAGNOSIS — R29898 Other symptoms and signs involving the musculoskeletal system: Secondary | ICD-10-CM | POA: Diagnosis present

## 2023-08-26 ENCOUNTER — Ambulatory Visit: Admitting: Sports Medicine

## 2023-08-26 DIAGNOSIS — M1712 Unilateral primary osteoarthritis, left knee: Secondary | ICD-10-CM

## 2023-08-26 MED ORDER — NAPROXEN 500 MG PO TABS: 500.0000 mg | ORAL_TABLET | Freq: Two times a day (BID) | ORAL | 3 refills | Status: AC

## 2023-08-26 NOTE — Progress Notes (Signed)
    Procedures performed today:    None.  Independent interpretation of notes and tests performed by another provider:   None.  Brief History, Exam, Impression, and Recommendations:    Primary osteoarthritis of left knee Very pleasant 53 year old female, x-ray confirmed tricompartmental osteoarthritis, she has done well with physical therapy including dry needling and cupping, she feels as though these have been the most efficacious. Each session lasted about 2 weeks, she is using Voltaren  4 times daily, ibuprofen  is not lasting long enough so we will switch to naproxen 500 twice daily as needed, and she can return to see me back as needed.    ____________________________________________ Joselyn Nicely. Sandy Crumb, M.D., ABFM., CAQSM., AME. Primary Care and Sports Medicine [facility]  Adjunct Professor of [facility] Medicine  [employer]  Restaurant manager, fast food

## 2023-08-26 NOTE — Assessment & Plan Note (Signed)
 Very pleasant 53 year old female, x-ray confirmed tricompartmental osteoarthritis, she has done well with physical therapy including dry needling and cupping, she feels as though these have been the most efficacious. Each session lasted about 2 weeks, she is using Voltaren  4 times daily, ibuprofen  is not lasting long enough so we will switch to naproxen 500 twice daily as needed, and she can return to see me back as needed.

## 2023-09-12 ENCOUNTER — Encounter

## 2023-09-28 ENCOUNTER — Ambulatory Visit: Attending: Sports Medicine

## 2023-09-28 DIAGNOSIS — M6281 Muscle weakness (generalized): Secondary | ICD-10-CM | POA: Diagnosis present

## 2023-09-28 DIAGNOSIS — R29898 Other symptoms and signs involving the musculoskeletal system: Secondary | ICD-10-CM | POA: Insufficient documentation

## 2023-09-28 DIAGNOSIS — M25562 Pain in left knee: Secondary | ICD-10-CM | POA: Insufficient documentation

## 2023-09-28 NOTE — Therapy (Signed)
 OUTPATIENT PHYSICAL THERAPY LOWER EXTREMITY TREATMENT RE-CERTIFICATION    Patient Name: Emily Mclean MRN: [id number] DOB:1970-10-17, 53 y.o., female Today's Date: 09/28/2023  END OF SESSION:  PT End of Session - 09/28/23 0801     Visit Number 7    Number of Visits 11    Date for PT Re-Evaluation 10/29/23    Authorization Type [employer] [place]    Authorization Time Period 7/23/8/21/25    Authorization - Visit Number 1    Authorization - Number of Visits 4    PT Start Time 0801    PT Stop Time 0843    PT Time Calculation (min) 42 min    Activity Tolerance Patient tolerated treatment well    Behavior During Therapy [facility] for tasks assessed/performed               Past Medical History:  Diagnosis Date   Allergic rhinitis    Allergy    Chronic allergic conjunctivitis    Generalized abdominal pain 11/08/2018   GERD (gastroesophageal reflux disease)    Hallux valgus (acquired), left foot 03/28/2019   Heart murmur    Thyroid  disease    Urticaria due to cold and heat    Past Surgical History:  Procedure Laterality Date   MANDIBLE SURGERY     NASAL SINUS SURGERY     polyps removed   Patient Active Problem List   Diagnosis Date Noted   Primary osteoarthritis of left knee 07/08/2023   Allergic rhinitis 02/15/2020   Chronic allergic conjunctivitis 02/15/2020   Urticaria due to cold and heat 02/15/2020   Hypocalcemia 11/02/2019   Hypothyroidism 11/02/2019    PCP: Dr Charlies DELENA Bellini  REFERRING PROVIDER: Dr Debby Petties   REFERRING DIAG: OA L knee   THERAPY DIAG:  Acute pain of left knee  Other symptoms and signs involving the musculoskeletal system  Muscle weakness (generalized)  Rationale for Evaluation and Treatment: Rehabilitation  ONSET DATE: 04/09/23  SUBJECTIVE:   SUBJECTIVE STATEMENT: Patient reports the knee feels pretty good today. The dry needling seems to help alleviate pain for a couple weeks.   PERTINENT HISTORY: Fracture of growth  plate L ankle in 5th grade - walked on L LE without diagnosis for ~ 2 weeks. Xrays showed fx - casted for 8 weeks NWB Denies any medical or musculoskeletal problems  PAIN:  Are you having pain? Yes: NPRS scale:3/10 Pain location: Lt lateral knee Pain description: sharp Aggravating factors: sitting with knee bent; steps - worse at the end of day  Relieving factors: meds; Voltaren  gel; elevation at night - better in the morning   PRECAUTIONS: None  RED FLAGS: None   WEIGHT BEARING RESTRICTIONS: No  FALLS:  Has patient fallen in last 6 months? 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  DIAGNOSTIC FINDINGS: xray- showed mild tricompartmental osteoarthritis.   PATIENT SURVEYS:  LEFS 19/80; 23.8% 09/28/23: 48/80  SENSATION: WFL  EDEMA:  No noticeable   MUSCLE LENGTH:  Hamstrings: Right 75 deg; Left 70 deg Thomas test: Right 10 deg; Left neutral   09/28/23: hamstring 90/90: Lt lacking 53, Rt lacking 30  POSTURE: rounded shoulders and increased thoracic kyphosis; stands with bilat knees hyperextended   PALPATION: Tenderness L knee - medial joint line; lateral head of the gastrocnemius; less through the distal lateral hamstring;   LOWER EXTREMITY ROM: WFL's - tight hip rotation L compared to R; bilat knee ROM equal and pain free   Active ROM Right 09/28/23 Left 09/28/23  Hip flexion    Hip extension    Hip abduction    Hip adduction    Hip internal rotation    Hip  external rotation    Knee flexion WNL WNL  Knee extension WNL WNL  Ankle dorsiflexion 13 8  Ankle plantarflexion    Ankle inversion    Ankle eversion     (Blank rows = not tested)  LOWER EXTREMITY MMT:  MMT Right eval Left eval Left 08/11/23 09/28/23 Left   Hip flexion 5 4 4+ 5  Hip extension 4+ 4 4+ 4+  Hip abduction 5 4 5  4+  Hip adduction   4+   Hip internal rotation      Hip external rotation      Knee flexion 5 4+ 5 4+  Knee extension 5 5 5  4+  Ankle dorsiflexion  Decreased compared to R; Tight    Ankle plantarflexion      Ankle inversion  Decreased compared to R; tight     Ankle eversion       (Blank rows = not tested)  FUNCTIONAL TESTS:  5 times sit to stand: 14.43 sec knees adducted  SLS R 10 sec; L 8 sec, more difficult  09/28/23: SLS: LLE 30 seconds   GAIT: Distance walked: 40 feet Assistive device utilized: None Level of assistance: Complete Independence Comments: WFL's; no notable limp L LE  [facility] Adult PT Treatment:                                                DATE: 09/28/23 Therapeutic Exercise: Reviewed HEP Manual therapy:  Skilled palpation of trigger points Passive calf stretching   Trigger Point Dry Needling  Subsequent Treatment: Instructions provided previously at initial dry needling treatment.   Patient Verbal Consent Given: Yes Education Handout Provided: Previously Provided Muscles Treated: Lt quadriceps (all), Lt lateral hamstring and gastroc/soleus  Electrical Stimulation Performed: No Treatment Response/Outcome: twitch response elicited    Therapeutic Activity: Re-assessment to determine overall progress, educating patient on progress towards goals.   [facility] Adult PT Treatment:                                                DATE: 6/525 Therapeutic Exercise: Nustep L 5 x 2 min  - did not tolerate due to knee pain Prone press ups 2x10 SLR x 10 SLR with ER x 10  Manual therapy:  PA and UPA mobs to B lumbar  Trigger Point Dry  Needling  Subsequent Treatment: Instructions provided previously at initial dry needling treatment.   Patient Verbal Consent Given: Yes Education Handout Provided: Previously Provided Muscles Treated: L lateral gastroc, peroneals, VL, VM, RF and IM Electrical Stimulation Performed: No  Treatment Response/Outcome: Utilized skilled palpation to identify bony landmarks and trigger points.  Able to illicit twitch response and muscle elongation.  Soft tissue mobilization to muscles needled to further promote tissue elongation and decreased pain.       [facility] Adult PT Treatment:                                                DATE: 08/04/23 Therapeutic Exercise: Nustep L 5 x 5 min  Supine hamstring stretch with addition of ankle eversion stretch w/ strap 30 sec x 3  Manual therapy:   STM/IASTM L lateral leg through peroneals Passive stretch into ankle inversion 10-15 sec hold x 3 PT assist   Neuromuscular re-ed: Toe yoga  Foot arching   Therapeutic Activity: Ankle eversion yellow TB 3 sec x 10  Isometric ankle eversion holding yellow TB on L and moving R ankle into eversion 3 sec x 10  Foam rolling along the lateral thigh using 5 inch green noodle - much better tolerated than larger white foam roller  Massage stick lateral thigh, quads - not effective on lateral leg                           PATIENT EDUCATION:  Education details:  HEP review; POC; TPDN Person educated: Patient Education method: Explanation and Demonstration Education comprehension: verbalized understanding  HOME EXERCISE PROGRAM: Access Code: [email or web address] URL: [email or web address] Date: 08/04/2023 Prepared by: Celyn Holt  Exercises - Hooklying Hamstring Stretch with Strap  - 2 x daily - 7 x weekly - 1 sets - 3 reps - 30 sec  hold - Supine ITB Stretch with Strap  - 2 x daily - 7 x weekly - 1 sets - 3 reps - 30 sec  hold - Prone Quadriceps Stretch with Strap (Mirrored)  - 2 x daily - 7 x weekly - 1 sets -  3 reps - 60 sec hold - Gastroc Stretch on Wall  - 2 x daily - 7 x weekly - 1 sets - 3 reps - 30 sec  hold - Soleus Stretch on Wall  - 2 x daily - 7 x weekly - 1 sets - 3 reps - 30 sec  hold - Seated Hamstring Stretch  - 2 x daily - 7 x weekly - 1 sets - 3 reps - 30 sec  hold - Hip Flexor Stretch on Step  - 2 x daily - 7 x weekly - 1 sets - 3 reps - 30-60 sec hold - Clam with Resistance  - 1 x daily - 3 x weekly - 2-3 sets - 10 reps - Standing Hip Abduction with Resistance at Ankles and Counter Support  - 1 x daily - 3 x weekly - 2 sets - 10 reps - Standing Hip Extension with Resistance at THIGHS and Counter Support  - 1 x daily - 3 x weekly - 2 sets - 10 reps - Squat with Chair Touch and Resistance Loop  - 1 x daily - 3 x weekly - 2 sets - 10 reps - Forward Step Down Touch with Heel  - 1 x daily - 3 x weekly - 2 sets - 10 reps - Ankle Inversion Stretch with Caregiver  - 1 x daily - 7 x weekly - 1 sets - 3 reps - 10-15 sec  hold -  Long Sitting Isometric Ankle Eversion in Dorsiflexion with Ball at Wall  - 1 x daily - 7 x weekly - 1-2 sets - 10 reps - 3 sec  hold - Ankle Eversion with Resistance  - 1 x daily - 7 x weekly - 1-2 sets - 10 reps - 3 sec  hold - Toe Yoga - Alternating Great Toe and Lesser Toe Extension  - 2 x daily - 7 x weekly - 1 sets - 10 reps - 3 sec  hold - Seated Arch Lifts  - 1 x daily - 7 x weekly - 1-2 sets - 10 reps - 3 sec  hold  ASSESSMENT:  CLINICAL IMPRESSION: Patient has completed 7 PT sessions since the start of care for Lt knee OA. She demonstrates overall improvements in LE strength and balance and reports the most pain relief with manual techniques/trigger point dry needling. She continues to have mild Lt knee/hip weakness and hamstring/gastroc tightness. She will benefit from continued skilled PT to address the above lingering deficits in order to optimize her function and assist in overall pain reduction.    EVAL: Patient is a 53 y.o. female who was seen today for  physical therapy evaluation and treatment for OA L knee. She reports L knee with no known injury. Symptoms have been present over the past 2-3 months with no known injury. She does report that she had a fracture of L ankle in 5th grade with delayed diagnosis and subsequent casting x 8 weeks NWB. Ankle fracture resolved with no further trouble. Of note, patient has decreased L ankle and hip mobility; poor tracking of L patella, decreased strength L LE; pain with sitting at her desk and walking, especially stairs. Patient stands with bilat knees hyperextended. She has pain with functional activities especially when sitting at her desk for extended periods of time. Patient will benefit from PT to address deficits identified.   OBJECTIVE IMPAIRMENTS: decreased activity tolerance, decreased balance, decreased strength, impaired flexibility, improper body mechanics, and pain.   GOALS: Goals reviewed with patient? Yes  SHORT TERM GOALS: Target date: 08/11/2023   Independent in initial HEP  Baseline: Goal status: MET  2.  Patient to report sitting at her desk for 2 hours without experencing pain =/> 1/10 Baseline:  09/28/23: 1 hour  Goal status: IN PROGRESSS  3.  Patient reports and demonstrates appropriate posture and alignment with re- to: office ergonomics with good position of LE's; avoid sitting with ankle/knee crossed; avoid standing with knees hyperextended  Baseline:  09/28/23: improved awareness Goal status: MET   LONG TERM GOALS: Target date: 10/29/23   Decrease pain L knee by 80-100% allowing patient to return to all normal functional activities  Baseline:  09/28/23: 30% decrease  Goal status: progressing  2.  5/5 strength bilat hips;knees; ankles  Baseline:  Goal status: progressing   3.  Improve single leg stance time on L LE to 10 or more seconds with no upper limb support  Baseline:  Goal status: MET  4.  Patient reports ability to sit at her desk for typical work day with  minima to no knee pain Baseline: almost constant pulling pain 09/28/23: 6 at worst  Goal status: progressing  5.  Independent in HEP including aquatic therapy as indicated  Baseline:  Goal status: ongoing   6.  Improve LEFS Left knee by 10-15 points  Baseline:  Goal status: MET   PLAN:  PT FREQUENCY: 1x/week  PT DURATION: 4 weeks  PLANNED INTERVENTIONS: 02835-  PT Re-evaluation, 97110-Therapeutic exercises, 97530- Therapeutic activity, [id number]- Neuromuscular re-education, 97535- Self Care, [id number]- Manual therapy, [phone]- Gait training, Stair training, Taping, Dry Needling, and Joint mobilization  PLAN FOR NEXT SESSION: continue DN as needed, continue hip and knee strength; add ankle strengthening and balance activities focus on L LE, hamstring stretching, calf stretching    Lucie Meeter, PT, DPT, ATC 09/28/23 12:59 PM

## 2023-10-17 ENCOUNTER — Ambulatory Visit: Attending: Sports Medicine

## 2023-10-17 DIAGNOSIS — M25562 Pain in left knee: Secondary | ICD-10-CM | POA: Insufficient documentation

## 2023-10-17 DIAGNOSIS — R29898 Other symptoms and signs involving the musculoskeletal system: Secondary | ICD-10-CM | POA: Diagnosis not present

## 2023-10-17 DIAGNOSIS — M47816 Spondylosis without myelopathy or radiculopathy, lumbar region: Secondary | ICD-10-CM | POA: Insufficient documentation

## 2023-10-17 DIAGNOSIS — M5459 Other low back pain: Secondary | ICD-10-CM | POA: Diagnosis present

## 2023-10-17 DIAGNOSIS — M6281 Muscle weakness (generalized): Secondary | ICD-10-CM | POA: Diagnosis not present

## 2023-10-17 NOTE — Therapy (Signed)
 OUTPATIENT PHYSICAL THERAPY LOWER EXTREMITY TREATMENT    Patient Name: Emily Mclean MRN: [id number] DOB:1970-11-28, 53 y.o., female Today's Date: 10/17/2023  END OF SESSION:  PT End of Session - 10/17/23 0845     Visit Number 8    Number of Visits 11    Date for PT Re-Evaluation 10/29/23    Authorization Type [employer] [place]    Authorization Time Period 7/23/8/21/25    Authorization - Visit Number 2    Authorization - Number of Visits 4    PT Start Time 0845    PT Stop Time 0928    PT Time Calculation (min) 43 min    Activity Tolerance Patient tolerated treatment well    Behavior During Therapy [facility] for tasks assessed/performed                Past Medical History:  Diagnosis Date   Allergic rhinitis    Allergy    Chronic allergic conjunctivitis    Generalized abdominal pain 11/08/2018   GERD (gastroesophageal reflux disease)    Hallux valgus (acquired), left foot 03/28/2019   Heart murmur    Thyroid  disease    Urticaria due to cold and heat    Past Surgical History:  Procedure Laterality Date   MANDIBLE SURGERY     NASAL SINUS SURGERY     polyps removed   Patient Active Problem List   Diagnosis Date Noted   Primary osteoarthritis of left knee 07/08/2023   Allergic rhinitis 02/15/2020   Chronic allergic conjunctivitis 02/15/2020   Urticaria due to cold and heat 02/15/2020   Hypocalcemia 11/02/2019   Hypothyroidism 11/02/2019    PCP: Dr Charlies DELENA Bellini  REFERRING PROVIDER: Dr Debby Petties   REFERRING DIAG: OA L knee   THERAPY DIAG:  Acute pain of left knee  Other symptoms and signs involving the musculoskeletal system  Muscle weakness (generalized)  Rationale for Evaluation and Treatment: Rehabilitation  ONSET DATE: 04/09/23  SUBJECTIVE:   SUBJECTIVE STATEMENT: Patient was on trip to [place] and did a lot of walking which flared up the knee. She has noticed that when the back hurts the knee hurts, so is wondering if the pain could be  attributed to the back. The pain will start along the lateral knee, but will spread about the entire Lt lateral leg.   PERTINENT HISTORY: Fracture of growth plate L ankle in 5th grade - walked on L LE without diagnosis for ~ 2 weeks. Xrays showed fx - casted for 8 weeks NWB Denies any medical or musculoskeletal problems  PAIN:  Are you having pain? Yes: NPRS scale:4 (knee); 6 low back Pain location: Lt lateral knee; low back Pain description: dull;ache  Aggravating factors: sitting with knee bent; steps - worse at the end of day  Relieving factors: meds; Voltaren  gel; elevation at night - better in the morning   PRECAUTIONS: None  RED FLAGS: None   WEIGHT BEARING RESTRICTIONS: No  FALLS:  Has patient fallen in last 6 months? 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  DIAGNOSTIC FINDINGS: xray- showed mild tricompartmental osteoarthritis.   PATIENT SURVEYS:  LEFS 19/80; 23.8% 09/28/23: 48/80  SENSATION: WFL  EDEMA:  No noticeable   MUSCLE LENGTH: Hamstrings: Right 75 deg; Left 70 deg Thomas test: Right 10 deg; Left neutral   09/28/23: hamstring 90/90: Lt lacking 53, Rt lacking 30  POSTURE: rounded shoulders and increased thoracic kyphosis; stands with bilat knees hyperextended   PALPATION: Tenderness L knee - medial joint line; lateral head of the gastrocnemius; less through the distal lateral hamstring;  LUMBAR ROM:   Active  A/PROM  eval  Flexion 25%, pain  lateral thigh  Extension WNL   Right lateral flexion 25% limited; LBP  Left lateral flexion 25% limited; LBP  Right rotation WNL  Left rotation WNL   (Blank rows = not tested)  LOWER EXTREMITY ROM: WFL's - tight hip rotation L compared to R; bilat knee ROM equal and pain free   Active ROM Right 09/28/23 Left 09/28/23  Hip flexion    Hip extension    Hip abduction    Hip adduction    Hip internal rotation    Hip external rotation    Knee flexion WNL WNL  Knee extension WNL WNL  Ankle dorsiflexion 13 8  Ankle plantarflexion    Ankle inversion    Ankle eversion     (Blank rows = not tested)  LOWER EXTREMITY MMT:  MMT Right eval Left eval Left 08/11/23 09/28/23 Left   Hip flexion 5 4 4+ 5  Hip extension 4+ 4 4+ 4+  Hip abduction 5 4 5  4+  Hip adduction   4+   Hip internal rotation      Hip external rotation      Knee flexion 5 4+ 5 4+  Knee extension 5 5 5  4+  Ankle dorsiflexion  Decreased compared to R; Tight    Ankle plantarflexion      Ankle inversion  Decreased compared to R; tight     Ankle eversion       (Blank rows = not tested)  FUNCTIONAL TESTS:  5 times sit to stand: 14.43 sec knees adducted  SLS R 10 sec; L 8 sec, more difficult  09/28/23: SLS: LLE 30 seconds   SPECIAL TESTS: 10/17/23: + SLR LLE  GAIT: Distance walked: 40 feet Assistive device utilized: None Level of assistance: Complete Independence Comments: WFL's; no notable limp L LE  [facility] Adult PT Treatment:                                                DATE: 10/17/23 Therapeutic Exercise: Lumbar AROM  Prone pressup x 10  Standing lumbar extension x 10  Sciatic nerve glides x 5 Reviewed and updated HEP  Manual Therapy: LLE LAD  Skilled palpation of trigger points STM Lt gluteals, quadriceps, gastroc/soleus  Trigger Point Dry Needling  Subsequent Treatment: Instructions provided previously at initial dry needling treatment.   Patient Verbal Consent Given: Yes Education Handout  Provided: Previously Provided Muscles Treated: Lt gluteals, Lt piriformis, Lt gastroc/soleus, Lt vastus lateralis  Electrical Stimulation Performed: No Treatment Response/Outcome: twitch response elicited      [facility] Adult PT Treatment:  DATE: 09/28/23 Therapeutic Exercise: Reviewed HEP Manual therapy:  Skilled palpation of trigger points Passive calf stretching   Trigger Point Dry Needling  Subsequent Treatment: Instructions provided previously at initial dry needling treatment.   Patient Verbal Consent Given: Yes Education Handout Provided: Previously Provided Muscles Treated: Lt quadriceps (all), Lt lateral hamstring and gastroc/soleus  Electrical Stimulation Performed: No Treatment Response/Outcome: twitch response elicited    Therapeutic Activity: Re-assessment to determine overall progress, educating patient on progress towards goals.   [facility] Adult PT Treatment:                                                DATE: 6/525 Therapeutic Exercise: Nustep L 5 x 2 min  - did not tolerate due to knee pain Prone press ups 2x10 SLR x 10 SLR with ER x 10  Manual therapy:  PA and UPA mobs to B lumbar  Trigger Point Dry Needling  Subsequent Treatment: Instructions provided previously at initial dry needling treatment.   Patient Verbal Consent Given: Yes Education Handout Provided: Previously Provided Muscles Treated: L lateral gastroc, peroneals, VL, VM, RF and IM Electrical Stimulation Performed: No Treatment Response/Outcome: Utilized skilled palpation to identify bony landmarks and trigger points.  Able to illicit twitch response and muscle elongation.  Soft tissue mobilization to muscles needled to further promote tissue elongation and decreased pain.       [facility] Adult PT Treatment:                                                DATE: 08/04/23 Therapeutic Exercise: Nustep L 5 x 5 min  Supine hamstring stretch with addition of  ankle eversion stretch w/ strap 30 sec x 3  Manual therapy:   STM/IASTM L lateral leg through peroneals Passive stretch into ankle inversion 10-15 sec hold x 3 PT assist   Neuromuscular re-ed: Toe yoga  Foot arching   Therapeutic Activity: Ankle eversion yellow TB 3 sec x 10  Isometric ankle eversion holding yellow TB on L and moving R ankle into eversion 3 sec x 10  Foam rolling along the lateral thigh using 5 inch green noodle - much better tolerated than larger white foam roller  Massage stick lateral thigh, quads - not effective on lateral leg                           PATIENT EDUCATION:  Education details:  HEP update; TPDN Person educated: Patient Education method: Medical illustrator, cues, handout  Education comprehension: verbalized understanding, returned demo   HOME EXERCISE PROGRAM: Access Code: [email or web address] URL: [email or web address] Date: 10/17/2023 Prepared by: Lucie Meeter  Exercises - Hooklying Hamstring Stretch with Strap  - 2 x daily - 7 x weekly - 1 sets - 3 reps - 30 sec  hold - Supine ITB Stretch with Strap  - 2 x daily - 7 x weekly - 1 sets - 3 reps - 30 sec  hold - Prone Quadriceps Stretch with Strap (Mirrored)  - 2 x daily - 7 x weekly - 1 sets - 3 reps - 60 sec hold - Gastroc Stretch on Wall  - 2 x daily - 7 x weekly - 1  sets - 3 reps - 30 sec  hold - Soleus Stretch on Wall  - 2 x daily - 7 x weekly - 1 sets - 3 reps - 30 sec  hold - Seated Hamstring Stretch  - 2 x daily - 7 x weekly - 1 sets - 3 reps - 30 sec  hold - Hip Flexor Stretch on Step  - 2 x daily - 7 x weekly - 1 sets - 3 reps - 30-60 sec hold - Clam with Resistance  - 1 x daily - 3 x weekly - 2-3 sets - 10 reps - Standing Hip Abduction with Resistance at Ankles and Counter Support  - 1 x daily - 3 x weekly - 2 sets - 10 reps - Standing Hip Extension with Resistance at THIGHS and Counter Support  - 1 x daily - 3 x weekly - 2 sets - 10 reps - Squat with Chair Touch and  Resistance Loop  - 1 x daily - 3 x weekly - 2 sets - 10 reps - Forward Step Down Touch with Heel  - 1 x daily - 3 x weekly - 2 sets - 10 reps - Ankle Inversion Stretch with Caregiver  - 1 x daily - 7 x weekly - 1 sets - 3 reps - 10-15 sec  hold - Long Sitting Isometric Ankle Eversion in Dorsiflexion with Ball at Wall  - 1 x daily - 7 x weekly - 1-2 sets - 10 reps - 3 sec  hold - Ankle Eversion with Resistance  - 1 x daily - 7 x weekly - 1-2 sets - 10 reps - 3 sec  hold - Toe Yoga - Alternating Great Toe and Lesser Toe Extension  - 2 x daily - 7 x weekly - 1 sets - 10 reps - 3 sec  hold - Seated Arch Lifts  - 1 x daily - 7 x weekly - 1-2 sets - 10 reps - 3 sec  hold - Prone Press Up  - 6 x daily - 7 x weekly - 1 sets - 10 reps - Standing Lumbar Extension  - 6 x daily - 7 x weekly - 1 sets - 10 reps - Supine Sciatic Nerve Glide  - 1 x daily - 7 x weekly - 1 sets - 10 reps  ASSESSMENT:  CLINICAL IMPRESSION: Patient arrives with moderate Lt knee pain and has noticed a correlation to back pain and LLE pain. Assessed lumbar ROM with Lt lateral leg pain elicited with trunk flexion and she also has a positive SLR. We incorporated extension biased movement, which abolished her Lt knee pain in session. Was encouraged to complete frequently throughout the day to determine lasting effects on pain. Continued with TPDN to LLE musculature with patient responding well to this intervention.    EVAL: Patient is a 53 y.o. female who was seen today for physical therapy evaluation and treatment for OA L knee. She reports L knee with no known injury. Symptoms have been present over the past 2-3 months with no known injury. She does report that she had a fracture of L ankle in 5th grade with delayed diagnosis and subsequent casting x 8 weeks NWB. Ankle fracture resolved with no further trouble. Of note, patient has decreased L ankle and hip mobility; poor tracking of L patella, decreased strength L LE; pain with sitting  at her desk and walking, especially stairs. Patient stands with bilat knees hyperextended. She has pain with functional activities especially when sitting at  her desk for extended periods of time. Patient will benefit from PT to address deficits identified.   OBJECTIVE IMPAIRMENTS: decreased activity tolerance, decreased balance, decreased strength, impaired flexibility, improper body mechanics, and pain.   GOALS: Goals reviewed with patient? Yes  SHORT TERM GOALS: Target date: 08/11/2023   Independent in initial HEP  Baseline: Goal status: MET  2.  Patient to report sitting at her desk for 2 hours without experencing pain =/> 1/10 Baseline:  09/28/23: 1 hour  Goal status: IN PROGRESSS  3.  Patient reports and demonstrates appropriate posture and alignment with re- to: office ergonomics with good position of LE's; avoid sitting with ankle/knee crossed; avoid standing with knees hyperextended  Baseline:  09/28/23: improved awareness Goal status: MET   LONG TERM GOALS: Target date: 10/29/23   Decrease pain L knee by 80-100% allowing patient to return to all normal functional activities  Baseline:  09/28/23: 30% decrease  Goal status: progressing  2.  5/5 strength bilat hips;knees; ankles  Baseline:  Goal status: progressing   3.  Improve single leg stance time on L LE to 10 or more seconds with no upper limb support  Baseline:  Goal status: MET  4.  Patient reports ability to sit at her desk for typical work day with minima to no knee pain Baseline: almost constant pulling pain 09/28/23: 6 at worst  Goal status: progressing  5.  Independent in HEP including aquatic therapy as indicated  Baseline:  Goal status: ongoing   6.  Improve LEFS Left knee by 10-15 points  Baseline:  Goal status: MET   PLAN:  PT FREQUENCY: 1x/week  PT DURATION: 4 weeks  PLANNED INTERVENTIONS: 97164- PT Re-evaluation, 97110-Therapeutic exercises, 97530- Therapeutic activity, 97112-  Neuromuscular re-education, 97535- Self Care, [id number]- Manual therapy, 97116- Gait training, Stair training, Taping, Dry Needling, and Joint mobilization  PLAN FOR NEXT SESSION: continue DN as needed, continue hip and knee strength; extension response? Core strengthening.   Deaysia Grigoryan, PT, DPT, ATC 10/17/23 1:04 PM

## 2023-10-24 ENCOUNTER — Ambulatory Visit

## 2023-10-24 ENCOUNTER — Other Ambulatory Visit: Payer: Self-pay | Admitting: Sports Medicine

## 2023-10-24 DIAGNOSIS — M47816 Spondylosis without myelopathy or radiculopathy, lumbar region: Secondary | ICD-10-CM | POA: Diagnosis not present

## 2023-10-24 DIAGNOSIS — M5459 Other low back pain: Secondary | ICD-10-CM | POA: Diagnosis not present

## 2023-10-24 DIAGNOSIS — R29898 Other symptoms and signs involving the musculoskeletal system: Secondary | ICD-10-CM | POA: Diagnosis not present

## 2023-10-24 DIAGNOSIS — M6281 Muscle weakness (generalized): Secondary | ICD-10-CM

## 2023-10-24 DIAGNOSIS — M25562 Pain in left knee: Secondary | ICD-10-CM | POA: Diagnosis not present

## 2023-10-24 HISTORY — DX: Spondylosis without myelopathy or radiculopathy, lumbar region: M47.816

## 2023-10-24 NOTE — Therapy (Signed)
 OUTPATIENT PHYSICAL THERAPY LOWER EXTREMITY TREATMENT    Patient Name: Emily  PERMELIA Mclean MRN: [id number] DOB:[birth year], 53 y.o., female Today's Date: 10/24/2023  END OF SESSION:  PT End of Session - 10/24/23 0848     Visit Number 9    Number of Visits 11    Date for PT Re-Evaluation 10/29/23    Authorization Type [employer] of [place]    Authorization Time Period 7/23/8/21/25    Authorization - Visit Number 3    Authorization - Number of Visits 4    PT Start Time 0848    PT Stop Time 0930    PT Time Calculation (min) 42 min    Activity Tolerance Patient tolerated treatment well    Behavior During Therapy [facility] for tasks assessed/performed                 Past Medical History:  Diagnosis Date   Allergic rhinitis    Allergy    Chronic allergic conjunctivitis    Generalized abdominal pain 11/08/2018   GERD (gastroesophageal reflux disease)    Hallux valgus (acquired), left foot 03/28/2019   Heart murmur    Thyroid  disease    Urticaria due to cold and heat    Past Surgical History:  Procedure Laterality Date   MANDIBLE SURGERY     NASAL SINUS SURGERY     polyps removed   Patient Active Problem List   Diagnosis Date Noted   Primary osteoarthritis of left knee 07/08/2023   Allergic rhinitis 02/15/2020   Chronic allergic conjunctivitis 02/15/2020   Urticaria due to cold and heat 02/15/2020   Hypocalcemia 11/02/2019   Hypothyroidism 11/02/2019    PCP: Dr Charlies DELENA Bellini  REFERRING PROVIDER: Dr Debby Petties   REFERRING DIAG: OA L knee   THERAPY DIAG:  Acute pain of left knee  Other symptoms and signs involving the musculoskeletal system  Muscle weakness (generalized)  Rationale for Evaluation and Treatment: Rehabilitation  ONSET DATE: 04/09/23  SUBJECTIVE:   SUBJECTIVE STATEMENT: Patient reports a little pain in the back right now, but nothing in knee currently. Does still get pain localized to the lateral knee, but will also feel it in the Lt  lateral thigh.   PERTINENT HISTORY: Fracture of growth plate L ankle in 5th grade - walked on L LE without diagnosis for ~ 2 weeks. Xrays showed fx - casted for 8 weeks NWB Denies any medical or musculoskeletal problems  PAIN:  Are you having pain? Yes: NPRS scale:1 currently  Pain location: low back currently, not in Lt knee at the moment.  Pain description: dull;ache  Aggravating factors: sitting with knee bent; steps - worse at the end of day  Relieving factors: meds; Voltaren  gel; elevation at night - better in the morning   PRECAUTIONS: None  RED FLAGS: None   WEIGHT BEARING RESTRICTIONS: No  FALLS:  Has patient fallen in last 6 months? 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  DIAGNOSTIC FINDINGS: xray-  showed mild tricompartmental osteoarthritis.   PATIENT SURVEYS:  LEFS 19/80; 23.8% 09/28/23: 48/80  SENSATION: WFL  EDEMA:  No noticeable   MUSCLE LENGTH: Hamstrings: Right 75 deg; Left 70 deg Thomas test: Right 10 deg; Left neutral   09/28/23: hamstring 90/90: Lt lacking 53, Rt lacking 30  POSTURE: rounded shoulders and increased thoracic kyphosis; stands with bilat knees hyperextended   PALPATION: Tenderness L knee - medial joint line; lateral head of the gastrocnemius; less through the distal lateral hamstring;  LUMBAR ROM:   Active  A/PROM  eval  Flexion 25%, pain lateral thigh  Extension WNL   Right lateral flexion 25% limited; LBP  Left lateral  flexion 25% limited; LBP  Right rotation WNL  Left rotation WNL   (Blank rows = not tested)  LOWER EXTREMITY ROM: WFL's - tight hip rotation L compared to R; bilat knee ROM equal and pain free   Active ROM Right 09/28/23 Left 09/28/23  Hip flexion    Hip extension    Hip abduction    Hip adduction    Hip internal rotation    Hip external rotation    Knee flexion WNL WNL  Knee extension WNL WNL  Ankle dorsiflexion 13 8  Ankle plantarflexion    Ankle inversion    Ankle eversion     (Blank rows = not tested)  LOWER EXTREMITY MMT:  MMT Right eval Left eval Left 08/11/23 09/28/23 Left   Hip flexion 5 4 4+ 5  Hip extension 4+ 4 4+ 4+  Hip abduction 5 4 5  4+  Hip adduction   4+   Hip internal rotation      Hip external rotation      Knee flexion 5 4+ 5 4+  Knee extension 5 5 5  4+  Ankle dorsiflexion  Decreased compared to R; Tight    Ankle plantarflexion      Ankle inversion  Decreased compared to R; tight     Ankle eversion       (Blank rows = not tested)  FUNCTIONAL TESTS:  5 times sit to stand: 14.43 sec knees adducted  SLS R 10 sec; L 8 sec, more difficult  09/28/23: SLS: LLE 30 seconds   SPECIAL TESTS: 10/17/23: + SLR LLE  GAIT: Distance walked: 40 feet Assistive device utilized: None Level of assistance: Complete Independence Comments: WFL's; no notable limp L LE  [facility] Adult PT Treatment:                                                DATE: 10/24/23 Therapeutic Exercise: Reviewed and updated HEP discussing frequency, sets, reps  Manual Therapy: Skilled palpation of trigger points STM Lt gluteals, quadriceps, gastroc/soleus  Trigger Point Dry Needling  Subsequent Treatment: Instructions provided previously at initial dry needling treatment.   Patient Verbal Consent Given: Yes Education Handout Provided: Previously Provided Muscles Treated: Lt quadriceps, gastroc/soleus  Electrical Stimulation Performed: No Treatment Response/Outcome: twitch response  elicited    Neuromuscular Re-education: Supine bent knee fallout x 10  Supine TA march 2 x 10  Quadruped leg extension x 10     [facility] Adult PT Treatment:  DATE: 10/17/23 Therapeutic Exercise: Lumbar AROM  Prone pressup x 10  Standing lumbar extension x 10  Sciatic nerve glides x 5 Reviewed and updated HEP  Manual Therapy: LLE LAD  Skilled palpation of trigger points STM Lt gluteals, quadriceps, gastroc/soleus  Trigger Point Dry Needling  Subsequent Treatment: Instructions provided previously at initial dry needling treatment.   Patient Verbal Consent Given: Yes Education Handout Provided: Previously Provided Muscles Treated: Lt gluteals, Lt piriformis, Lt gastroc/soleus, Lt vastus lateralis  Electrical Stimulation Performed: No Treatment Response/Outcome: twitch response elicited      [facility] Adult PT Treatment:                                                DATE: 09/28/23 Therapeutic Exercise: Reviewed HEP Manual therapy:  Skilled palpation of trigger points Passive calf stretching   Trigger Point Dry Needling  Subsequent Treatment: Instructions provided previously at initial dry needling treatment.   Patient Verbal Consent Given: Yes Education Handout Provided: Previously Provided Muscles Treated: Lt quadriceps (all), Lt lateral hamstring and gastroc/soleus  Electrical Stimulation Performed: No Treatment Response/Outcome: twitch response elicited    Therapeutic Activity: Re-assessment to determine overall progress, educating patient on progress towards goals.   [facility] Adult PT Treatment:                                                DATE: 6/525 Therapeutic Exercise: Nustep L 5 x 2 min  - did not tolerate due to knee pain Prone press ups 2x10 SLR x 10 SLR with ER x 10  Manual therapy:  PA and UPA mobs to B lumbar  Trigger Point Dry Needling  Subsequent Treatment: Instructions provided previously at initial dry  needling treatment.   Patient Verbal Consent Given: Yes Education Handout Provided: Previously Provided Muscles Treated: L lateral gastroc, peroneals, VL, VM, RF and IM Electrical Stimulation Performed: No Treatment Response/Outcome: Utilized skilled palpation to identify bony landmarks and trigger points.  Able to illicit twitch response and muscle elongation.  Soft tissue mobilization to muscles needled to further promote tissue elongation and decreased pain.                              PATIENT EDUCATION:  Education details:  HEP update; TPDN Person educated: Patient Education method: Medical illustrator, cues, handout  Education comprehension: verbalized understanding, returned demo   HOME EXERCISE PROGRAM: Access Code: [email or web address] URL: [email or web address] Date: 10/24/2023 Prepared by: Lucie Meeter  Exercises - Hooklying Hamstring Stretch with Strap  - 2 x daily - 7 x weekly - 1 sets - 3 reps - 30 sec  hold - Supine ITB Stretch with Strap  - 2 x daily - 7 x weekly - 1 sets - 3 reps - 30 sec  hold - Prone Quadriceps Stretch with Strap (Mirrored)  - 2 x daily - 7 x weekly - 1 sets - 3 reps - 60 sec hold - Gastroc Stretch on Wall  - 2 x daily - 7 x weekly - 1 sets - 3 reps - 30 sec  hold - Soleus Stretch on Wall  - 2 x daily - 7 x weekly - 1 sets - 3  reps - 30 sec  hold - Seated Hamstring Stretch  - 2 x daily - 7 x weekly - 1 sets - 3 reps - 30 sec  hold - Hip Flexor Stretch on Step  - 2 x daily - 7 x weekly - 1 sets - 3 reps - 30-60 sec hold - Clam with Resistance  - 1 x daily - 3 x weekly - 2-3 sets - 10 reps - Standing Hip Abduction with Resistance at Ankles and Counter Support  - 1 x daily - 3 x weekly - 2 sets - 10 reps - Standing Hip Extension with Resistance at THIGHS and Counter Support  - 1 x daily - 3 x weekly - 2 sets - 10 reps - Squat with Chair Touch and Resistance Loop  - 1 x daily - 3 x weekly - 2 sets - 10 reps - Prone Press Up  - 6 x daily  - 7 x weekly - 1 sets - 10 reps - Standing Lumbar Extension  - 6 x daily - 7 x weekly - 1 sets - 10 reps - Supine Sciatic Nerve Glide  - 1 x daily - 7 x weekly - 1 sets - 10 reps - Bent Knee Fallouts  - 1 x daily - 7 x weekly - 2 sets - 10 reps - Supine March with Posterior Pelvic Tilt  - 1 x daily - 7 x weekly - 2 sets - 10 reps  ASSESSMENT:  CLINICAL IMPRESSION: Patient arrives without reports of knee pain, but mild low back pain is present. Further TPDN performed with excellent twitch response elicited in Lt quad and gastroc. Incorporated core stabilization and progressed gluteal strengthening with patient having difficulty maintaining lumbopelvic stability with quadruped strengthening. No reports of knee pain throughout session. She has plans to f/u with referring provider regarding her chronic low back pain as it might relate to her Lt knee pain.    EVAL: Patient is a 53 y.o. female who was seen today for physical therapy evaluation and treatment for OA L knee. She reports L knee with no known injury. Symptoms have been present over the past 2-3 months with no known injury. She does report that she had a fracture of L ankle in 5th grade with delayed diagnosis and subsequent casting x 8 weeks NWB. Ankle fracture resolved with no further trouble. Of note, patient has decreased L ankle and hip mobility; poor tracking of L patella, decreased strength L LE; pain with sitting at her desk and walking, especially stairs. Patient stands with bilat knees hyperextended. She has pain with functional activities especially when sitting at her desk for extended periods of time. Patient will benefit from PT to address deficits identified.   OBJECTIVE IMPAIRMENTS: decreased activity tolerance, decreased balance, decreased strength, impaired flexibility, improper body mechanics, and pain.   GOALS: Goals reviewed with patient? Yes  SHORT TERM GOALS: Target date: 08/11/2023   Independent in initial HEP   Baseline: Goal status: MET  2.  Patient to report sitting at her desk for 2 hours without experencing pain =/> 1/10 Baseline:  09/28/23: 1 hour  Goal status: IN PROGRESSS  3.  Patient reports and demonstrates appropriate posture and alignment with re- to: office ergonomics with good position of LE's; avoid sitting with ankle/knee crossed; avoid standing with knees hyperextended  Baseline:  09/28/23: improved awareness Goal status: MET   LONG TERM GOALS: Target date: 10/29/23   Decrease pain L knee by 80-100% allowing patient to return to all  normal functional activities  Baseline:  09/28/23: 30% decrease  Goal status: progressing  2.  5/5 strength bilat hips;knees; ankles  Baseline:  Goal status: progressing   3.  Improve single leg stance time on L LE to 10 or more seconds with no upper limb support  Baseline:  Goal status: MET  4.  Patient reports ability to sit at her desk for typical work day with minima to no knee pain Baseline: almost constant pulling pain 09/28/23: 6 at worst  Goal status: progressing  5.  Independent in HEP including aquatic therapy as indicated  Baseline:  Goal status: ongoing   6.  Improve LEFS Left knee by 10-15 points  Baseline:  Goal status: MET   PLAN:  PT FREQUENCY: 1x/week  PT DURATION: 4 weeks  PLANNED INTERVENTIONS: 97164- PT Re-evaluation, 97110-Therapeutic exercises, 97530- Therapeutic activity, 97112- Neuromuscular re-education, 97535- Self Care, [id number]- Manual therapy, 97116- Gait training, Stair training, Taping, Dry Needling, and Joint mobilization  PLAN FOR NEXT SESSION: continue DN as needed, continue hip and knee strength; Core strengthening.   Udell Mazzocco, PT, DPT, ATC 10/24/23 9:31 AM

## 2023-10-27 ENCOUNTER — Ambulatory Visit

## 2023-10-27 DIAGNOSIS — M25562 Pain in left knee: Secondary | ICD-10-CM

## 2023-10-27 DIAGNOSIS — M5459 Other low back pain: Secondary | ICD-10-CM

## 2023-10-27 DIAGNOSIS — M6281 Muscle weakness (generalized): Secondary | ICD-10-CM | POA: Diagnosis not present

## 2023-10-27 DIAGNOSIS — M47816 Spondylosis without myelopathy or radiculopathy, lumbar region: Secondary | ICD-10-CM | POA: Diagnosis not present

## 2023-10-27 DIAGNOSIS — R29898 Other symptoms and signs involving the musculoskeletal system: Secondary | ICD-10-CM

## 2023-10-27 NOTE — Therapy (Signed)
 OUTPATIENT PHYSICAL THERAPY TREATMENT RE-EVALUATION    Patient Name: Emily Mclean  JACOBA CHERNEY MRN: [id number] DOB:06-25-1970, 53 y.o., female Today's Date: 10/27/2023  END OF SESSION:  PT End of Session - 10/27/23 0933     Visit Number 10    Number of Visits 14    Date for PT Re-Evaluation 11/26/23    Authorization Type [employer]    Authorization Time Period 7/23/8/21/25    Authorization - Visit Number 4    Authorization - Number of Visits 4    PT Start Time 0932    PT Stop Time 1015    PT Time Calculation (min) 43 min    Activity Tolerance Patient tolerated treatment well    Behavior During Therapy [facility] for tasks assessed/performed                  Past Medical History:  Diagnosis Date   Allergic rhinitis    Allergy    Chronic allergic conjunctivitis    Generalized abdominal pain 11/08/2018   GERD (gastroesophageal reflux disease)    Hallux valgus (acquired), left foot 03/28/2019   Heart murmur    Thyroid  disease    Urticaria due to cold and heat    Past Surgical History:  Procedure Laterality Date   MANDIBLE SURGERY     NASAL SINUS SURGERY     polyps removed   Patient Active Problem List   Diagnosis Date Noted   Lumbar spondylosis 10/24/2023   Primary osteoarthritis of left knee 07/08/2023   Allergic rhinitis 02/15/2020   Chronic allergic conjunctivitis 02/15/2020   Urticaria due to cold and heat 02/15/2020   Hypocalcemia 11/02/2019   Hypothyroidism 11/02/2019    PCP: Dr Charlies DELENA Bellini  REFERRING PROVIDER: Dr Debby Petties   REFERRING DIAG: OA L knee  M47.816 (ICD-10-CM) - Lumbar spondylosis  THERAPY DIAG:  Other low back pain  Acute pain of left knee  Other symptoms and signs involving the musculoskeletal system  Muscle weakness (generalized)  Rationale for Evaluation and Treatment: Rehabilitation  ONSET DATE: 04/09/23  SUBJECTIVE:   SUBJECTIVE STATEMENT: Patient reports the knee is feeling good today without pain. Has a little  bit of pain in the low back described as pressure. Patient reports chronic back pain that has been off/on for the past 19 years. It used to just be flared up when traveling and sleeping in a different bed. Now the pain has become more consistent over the past 3 years. Then she began having the pain in the lateral Lt knee that would radiate up the thigh in January. No numbness/tingling. No red flag symptoms. Has f/u with referring provider tomorrow.   PERTINENT HISTORY: Fracture of growth plate L ankle in 5th grade - walked on L LE without diagnosis for ~ 2 weeks. Xrays showed fx - casted for 8 weeks NWB Denies any medical or musculoskeletal problems  PAIN:  Are you having pain? Yes: NPRS scale:1 currently; at worst 8 Pain location: low back  Pain description: sore,knot Aggravating factors: prolonged sitting; sit to stand transfer  Relieving factors: heating pad   PRECAUTIONS: None  RED FLAGS: None   WEIGHT BEARING RESTRICTIONS: No  FALLS:  Has patient fallen in last 6 months? 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  DIAGNOSTIC FINDINGS: xray- showed mild tricompartmental osteoarthritis.   PATIENT SURVEYS:  LEFS 19/80; 23.8% 09/28/23: 48/80  SENSATION: WFL  EDEMA:  No noticeable   MUSCLE LENGTH: Hamstrings: Right 75 deg; Left 70 deg Thomas test: Right 10 deg; Left neutral   09/28/23: hamstring 90/90: Lt lacking 53, Rt lacking 30  POSTURE: rounded shoulders and increased  thoracic kyphosis; stands with bilat knees hyperextended; excessive lumbar lordosis   PALPATION: Tenderness L knee - medial joint line; lateral head of the gastrocnemius; less through the distal lateral hamstring;   10/27/23: pain and hypermobility PAIVM lumbar spine; tautness lumbar paraspinals, gluteals  LUMBAR ROM:   Active  A/PROM  eval 10/27/23  Flexion 25%, pain lateral thigh 25% limited pain into Lt buttock  Extension WNL  25% limited LBP  Right lateral flexion 25% limited; LBP 25% limited; LBP  Left lateral flexion 25% limited; LBP 25% limited; LBP  Right rotation WNL WNL  Left rotation WNL WNL   (Blank rows = not tested)  LOWER EXTREMITY ROM: WFL's - tight hip rotation L compared to R; bilat knee ROM equal and pain free   Active ROM Right 09/28/23 Left 09/28/23  Hip flexion    Hip extension    Hip abduction    Hip adduction    Hip internal rotation    Hip external rotation    Knee flexion WNL WNL  Knee extension WNL WNL  Ankle dorsiflexion 13 8  Ankle plantarflexion    Ankle inversion    Ankle eversion     (Blank rows = not tested)  LOWER EXTREMITY MMT:  MMT Right eval Left eval Left 08/11/23 09/28/23 Left  10/27/23  Hip flexion 5 4 4+ 5   Hip extension 4+ 4 4+ 4+ 5 bilateral   Hip abduction 5 4 5  4+ Rt: 4; Lt: 4+  Hip adduction   4+    Hip internal rotation       Hip external rotation       Knee flexion 5 4+ 5 4+ Lt: 4+; Rt: 5   Knee extension 5 5 5  4+ Lt:5; Rt: 5  Ankle dorsiflexion  Decreased compared to R; Tight   5 bilateral   Ankle plantarflexion     SL calf raise   Ankle inversion  Decreased compared to R; tight      Ankle eversion     5 bilateral    (Blank rows = not tested)  FUNCTIONAL TESTS:  5 times sit to stand: 14.43 sec knees adducted  SLS R 10 sec; L 8 sec, more difficult  09/28/23: SLS: LLE 30 seconds   SPECIAL TESTS: 10/17/23: + SLR LLE  10/27/23: (-) SLR   GAIT: Distance walked: 40 feet Assistive device utilized: None Level of  assistance: Complete Independence Comments: WFL's; no notable limp L LE  [facility] Adult PT Treatment:                                                DATE: 10/27/23 Therapeutic Exercise: Reviewed HEP Manual Therapy: Skilled palpation of trigger points Trigger Point  Dry Needling  Subsequent Treatment: Instructions provided previously at initial dry needling treatment.   Patient Verbal Consent Given: Yes Education Handout Provided: Previously Provided Muscles Treated: bilateral gluteals and piriformis  Electrical Stimulation Performed: No Treatment Response/Outcome: twitch response elicited    Self Care: Anatomy of current condition and rationale for interventions Posture education    [facility] Adult PT Treatment:                                                DATE: 10/24/23 Therapeutic Exercise: Reviewed and updated HEP discussing frequency, sets, reps  Manual Therapy: Skilled palpation of trigger points STM Lt gluteals, quadriceps, gastroc/soleus  Trigger Point Dry Needling  Subsequent Treatment: Instructions provided previously at initial dry needling treatment.   Patient Verbal Consent Given: Yes Education Handout Provided: Previously Provided Muscles Treated: Lt quadriceps, gastroc/soleus  Electrical Stimulation Performed: No Treatment Response/Outcome: twitch response elicited    Neuromuscular Re-education: Supine bent knee fallout x 10  Supine TA march 2 x 10  Quadruped leg extension x 10     [facility] Adult PT Treatment:                                                DATE: 10/17/23 Therapeutic Exercise: Lumbar AROM  Prone pressup x 10  Standing lumbar extension x 10  Sciatic nerve glides x 5 Reviewed and updated HEP  Manual Therapy: LLE LAD  Skilled palpation of trigger points STM Lt gluteals, quadriceps, gastroc/soleus  Trigger Point Dry Needling  Subsequent Treatment: Instructions provided previously at initial dry needling treatment.   Patient Verbal Consent  Given: Yes Education Handout Provided: Previously Provided Muscles Treated: Lt gluteals, Lt piriformis, Lt gastroc/soleus, Lt vastus lateralis  Electrical Stimulation Performed: No Treatment Response/Outcome: twitch response elicited                             PATIENT EDUCATION:  Education details:  HEP review; TPDN Person educated: Patient Education method: Medical illustrator, cues, handout  Education comprehension: verbalized understanding, returned demo   HOME EXERCISE PROGRAM: Access Code: [email or web address] URL: [email or web address] Date: 10/24/2023 Prepared by: Lucie Meeter  Exercises - Hooklying Hamstring Stretch with Strap  - 2 x daily - 7 x weekly - 1 sets - 3 reps - 30 sec  hold - Supine ITB Stretch with Strap  - 2 x daily - 7 x weekly - 1 sets - 3 reps - 30 sec  hold - Prone Quadriceps Stretch with Strap (Mirrored)  - 2 x daily - 7 x weekly - 1 sets - 3 reps - 60 sec hold - Gastroc Stretch on Wall  - 2 x daily - 7 x weekly - 1 sets - 3 reps - 30 sec  hold - Soleus Stretch on Wall  - 2 x daily - 7 x weekly - 1 sets - 3 reps - 30 sec  hold - Seated Hamstring Stretch  - 2 x daily - 7 x weekly - 1 sets - 3 reps - 30 sec  hold - Hip Flexor Stretch on Step  - 2 x daily - 7 x weekly - 1 sets - 3 reps - 30-60 sec hold -  Clam with Resistance  - 1 x daily - 3 x weekly - 2-3 sets - 10 reps - Standing Hip Abduction with Resistance at Ankles and Counter Support  - 1 x daily - 3 x weekly - 2 sets - 10 reps - Standing Hip Extension with Resistance at THIGHS and Counter Support  - 1 x daily - 3 x weekly - 2 sets - 10 reps - Squat with Chair Touch and Resistance Loop  - 1 x daily - 3 x weekly - 2 sets - 10 reps - Prone Press Up  - 6 x daily - 7 x weekly - 1 sets - 10 reps - Standing Lumbar Extension  - 6 x daily - 7 x weekly - 1 sets - 10 reps - Supine Sciatic Nerve Glide  - 1 x daily - 7 x weekly - 1 sets - 10 reps - Bent Knee Fallouts  - 1 x daily - 7 x weekly - 2  sets - 10 reps - Supine March with Posterior Pelvic Tilt  - 1 x daily - 7 x weekly - 2 sets - 10 reps  ASSESSMENT:  CLINICAL IMPRESSION: Patient with new referral to evaluate and treat for lumbar spondylosis. She demonstrates limited and painful trunk flexion, lateral flexion, and extension AROM, hip,core and Lt hamstring weakness, pain and hypermobility with PAIVM of the lumbar spine, tautness about lumbar paraspinals and gluteals. She will benefit from continued skilled PT 1 x week for 4 weeks to address the above stated deficits and continue with appropriate treatment for Lt knee OA.    EVAL: Patient is a 53 y.o. female who was seen today for physical therapy evaluation and treatment for OA L knee. She reports L knee with no known injury. Symptoms have been present over the past 2-3 months with no known injury. She does report that she had a fracture of L ankle in 5th grade with delayed diagnosis and subsequent casting x 8 weeks NWB. Ankle fracture resolved with no further trouble. Of note, patient has decreased L ankle and hip mobility; poor tracking of L patella, decreased strength L LE; pain with sitting at her desk and walking, especially stairs. Patient stands with bilat knees hyperextended. She has pain with functional activities especially when sitting at her desk for extended periods of time. Patient will benefit from PT to address deficits identified.   OBJECTIVE IMPAIRMENTS: decreased activity tolerance, decreased balance, decreased strength, impaired flexibility, improper body mechanics, and pain.   GOALS: Goals reviewed with patient? Yes  SHORT TERM GOALS: Target date: 08/11/2023   Independent in initial HEP  Baseline: Goal status: MET  2.  Patient to report sitting at her desk for 2 hours without experencing pain =/> 1/10 Baseline:  09/28/23: 1 hour  10/27/23: reports 2 hours sitting without knee pain.  Goal status: MET  3.  Patient reports and demonstrates appropriate  posture and alignment with re- to: office ergonomics with good position of LE's; avoid sitting with ankle/knee crossed; avoid standing with knees hyperextended  Baseline:  09/28/23: improved awareness Goal status: MET   LONG TERM GOALS: Target date: 11/26/23   Decrease pain L knee by 80-100% allowing patient to return to all normal functional activities  Baseline:  09/28/23: 30% decrease  10/27/23: 80% decrease  Goal status: progressing  2.  5/5 strength bilat hips;knees; ankles  Baseline:  Goal status: partially met    3.  Improve single leg stance time on L LE to 10 or more seconds with  no upper limb support  Baseline:  Goal status: MET  4.  Patient reports ability to sit at her desk for typical work day with minima to no knee pain Baseline: almost constant pulling pain 09/28/23: 6 at worst  10/27/23: able to complete work day without knee pain  Goal status: MET  5.  Independent in HEP including aquatic therapy as indicated  Baseline:  Goal status: ongoing   6.  Improve LEFS Left knee by 10-15 points  Baseline:  Goal status: MET    7. Patient will report at least a 50% improvement in low back pain with seated activity to improve her tolerance to work activity.  Baseline: 8 at worst  Goal status: NEW  8. Patient will demonstrate proper core engagement with transfers and functional activity to reduce stress on her back.   Baseline: no knowledge, increased pain with sit to stand  Goal status: NEW  PLAN:  PT FREQUENCY: 1x/week  PT DURATION: 4 weeks  PLANNED INTERVENTIONS: [id number]- PT Re-evaluation, 97110-Therapeutic exercises, 97530- Therapeutic activity, [id number]- Neuromuscular re-education, 97535- Self Care, [id number]- Manual therapy, [id number]- Gait training, [id number]- Electrical stimulation (unattended), [id number]- Electrical stimulation (manual), 20560 (1-2 muscles), 20561 (3+ muscles)- Dry Needling, Stair training, Taping, Joint mobilization, Cryotherapy, and Moist heat  PLAN FOR  NEXT SESSION: continue DN as needed, continue hip and knee strength; Core stabilization.   Eryca Bolte, PT, DPT, ATC 10/27/23 12:49 PM

## 2023-10-28 ENCOUNTER — Ambulatory Visit: Admitting: Sports Medicine

## 2023-10-28 DIAGNOSIS — M47816 Spondylosis without myelopathy or radiculopathy, lumbar region: Secondary | ICD-10-CM | POA: Diagnosis not present

## 2023-10-28 DIAGNOSIS — M1712 Unilateral primary osteoarthritis, left knee: Secondary | ICD-10-CM

## 2023-10-28 NOTE — Assessment & Plan Note (Signed)
 Deanna started to have some increasing axial low back pain, bilateral worse with sitting, flexion, Valsalva with radiation to the buttock, somewhat down to the knee on the left but not past. She has done really well with physical therapy. She will continue this longitudinally, and return as needed. Of note I would like some baseline x-rays today.

## 2023-10-28 NOTE — Assessment & Plan Note (Signed)
Resolved with physical therapy. 

## 2023-10-28 NOTE — Progress Notes (Signed)
    Procedures performed today:    None.  Independent interpretation of notes and tests performed by another provider:   None.  Brief History, Exam, Impression, and Recommendations:    Lumbar spondylosis Emily Mclean started to have some increasing axial low back pain, bilateral worse with sitting, flexion, Valsalva with radiation to the buttock, somewhat down to the knee on the left but not past. She has done really well with physical therapy. She will continue this longitudinally, and return as needed. Of note I would like some baseline x-rays today.  Primary osteoarthritis of left knee Resolved with physical therapy.   ____________________________________________ Emily Mclean PARAS. Curtis, M.D., ABFM., CAQSM., AME. Primary Care and Sports Medicine [facility]  Adjunct Professor of [facility]  [employer]  Restaurant manager, fast food

## 2023-10-31 ENCOUNTER — Encounter: Payer: Self-pay | Admitting: Sports Medicine

## 2023-11-03 ENCOUNTER — Ambulatory Visit

## 2023-11-03 DIAGNOSIS — M47816 Spondylosis without myelopathy or radiculopathy, lumbar region: Secondary | ICD-10-CM | POA: Diagnosis not present

## 2023-11-03 DIAGNOSIS — M6281 Muscle weakness (generalized): Secondary | ICD-10-CM

## 2023-11-03 DIAGNOSIS — M25562 Pain in left knee: Secondary | ICD-10-CM

## 2023-11-03 DIAGNOSIS — M5459 Other low back pain: Secondary | ICD-10-CM

## 2023-11-03 DIAGNOSIS — R29898 Other symptoms and signs involving the musculoskeletal system: Secondary | ICD-10-CM | POA: Diagnosis not present

## 2023-11-03 NOTE — Therapy (Signed)
 OUTPATIENT PHYSICAL THERAPY TREATMENT     Patient Name: Emily Mclean MRN: [id number] DOB:November 18, 1970, 53 y.o., female Today's Date: 11/03/2023  END OF SESSION:  PT End of Session - 11/03/23 1316     Visit Number 11    Number of Visits 14    Date for PT Re-Evaluation 11/26/23    Authorization Type [employer] of [place]    Authorization Time Period 8/26-9/24    Authorization - Visit Number 1    Authorization - Number of Visits 3    PT Start Time 1316    PT Stop Time 1359    PT Time Calculation (min) 43 min    Activity Tolerance Patient tolerated treatment well    Behavior During Therapy WFL for tasks assessed/performed                  Past Medical History:  Diagnosis Date   Allergic rhinitis    Allergy    Chronic allergic conjunctivitis    Generalized abdominal pain 11/08/2018   GERD (gastroesophageal reflux disease)    Hallux valgus (acquired), left foot 03/28/2019   Heart murmur    Thyroid  disease    Urticaria due to cold and heat    Past Surgical History:  Procedure Laterality Date   MANDIBLE SURGERY     NASAL SINUS SURGERY     polyps removed   Patient Active Problem List   Diagnosis Date Noted   Lumbar spondylosis 10/24/2023   Primary osteoarthritis of left knee 07/08/2023   Allergic rhinitis 02/15/2020   Chronic allergic conjunctivitis 02/15/2020   Urticaria due to cold and heat 02/15/2020   Hypocalcemia 11/02/2019   Hypothyroidism 11/02/2019    PCP: Dr Charlies DELENA Bellini  REFERRING PROVIDER: Dr Debby Petties   REFERRING DIAG: OA L knee  M47.816 (ICD-10-CM) - Lumbar spondylosis  THERAPY DIAG:  Acute pain of left knee  Other symptoms and signs involving the musculoskeletal system  Muscle weakness (generalized)  Other low back pain  Rationale for Evaluation and Treatment: Rehabilitation  ONSET DATE: 04/09/23  SUBJECTIVE:   SUBJECTIVE STATEMENT: Patient reports she has started to lateral thigh pain bilaterally when her back hurts bad  over the past few days. She feels pretty good today having just had a massage.   PERTINENT HISTORY: Fracture of growth plate L ankle in 5th grade - walked on L LE without diagnosis for ~ 2 weeks. Xrays showed fx - casted for 8 weeks NWB Denies any medical or musculoskeletal problems  PAIN:  Are you having pain? Yes: NPRS scale:1 currently Pain location: low back  Pain description: sore,knot Aggravating factors: prolonged sitting; sit to stand transfer  Relieving factors: heating pad   PRECAUTIONS: None  RED FLAGS: None   WEIGHT BEARING RESTRICTIONS: No  FALLS:  Has patient fallen in last 6 months? 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  DIAGNOSTIC FINDINGS: xray- showed mild tricompartmental osteoarthritis.   PATIENT SURVEYS:  LEFS 19/80; 23.8% 09/28/23: 48/80  SENSATION: WFL  EDEMA:  No noticeable   MUSCLE LENGTH: Hamstrings: Right 75 deg; Left 70 deg Thomas test: Right 10 deg; Left neutral   09/28/23: hamstring 90/90: Lt lacking 53, Rt lacking 30  POSTURE: rounded shoulders and increased thoracic kyphosis; stands with bilat knees hyperextended; excessive lumbar lordosis   PALPATION: Tenderness L knee - medial joint line; lateral head of the gastrocnemius; less through the distal lateral hamstring;   10/27/23: pain and hypermobility PAIVM lumbar spine; tautness lumbar paraspinals, gluteals  LUMBAR ROM:   Active  A/PROM  eval 10/27/23  Flexion 25%, pain lateral  thigh 25% limited pain into Lt buttock  Extension WNL  25% limited LBP  Right lateral flexion 25% limited; LBP 25% limited; LBP  Left lateral flexion 25% limited; LBP 25% limited; LBP  Right rotation WNL WNL  Left rotation WNL WNL   (Blank rows = not tested)  LOWER EXTREMITY ROM: WFL's - tight hip rotation L compared to R; bilat knee ROM equal and pain free   Active ROM Right 09/28/23 Left 09/28/23  Hip flexion    Hip extension    Hip abduction    Hip adduction    Hip internal rotation    Hip external rotation    Knee flexion WNL WNL  Knee extension WNL WNL  Ankle dorsiflexion 13 8  Ankle plantarflexion    Ankle inversion    Ankle eversion     (Blank rows = not tested)  LOWER EXTREMITY MMT:  MMT Right eval Left eval Left 08/11/23 09/28/23 Left  10/27/23  Hip flexion 5 4 4+ 5   Hip extension 4+ 4 4+ 4+ 5 bilateral   Hip abduction 5 4 5  4+ Rt: 4; Lt: 4+  Hip adduction   4+    Hip internal rotation       Hip external rotation       Knee flexion 5 4+ 5 4+ Lt: 4+; Rt: 5   Knee extension 5 5 5  4+ Lt:5; Rt: 5  Ankle dorsiflexion  Decreased compared to R; Tight   5 bilateral   Ankle plantarflexion     SL calf raise   Ankle inversion  Decreased compared to R; tight      Ankle eversion     5 bilateral    (Blank rows = not tested)  FUNCTIONAL TESTS:  5 times sit to stand: 14.43 sec knees adducted  SLS R 10 sec; L 8 sec, more difficult  09/28/23: SLS: LLE 30 seconds   SPECIAL TESTS: 10/17/23: + SLR LLE  10/27/23: (-) SLR   GAIT: Distance walked: 40 feet Assistive device utilized: None Level of assistance: Complete Independence Comments: WFL's; no notable limp L LE  [facility] Adult PT Treatment:                                                DATE: 11/03/23 Therapeutic Exercise: Reviewed and updated HEP Trigger Point Dry Needling  Subsequent Treatment: Instructions provided previously at initial dry needling treatment.   Patient Verbal Consent Given: Yes Education Handout  Provided: Previously Provided Muscles Treated: bilateral gluteals, piriformis, vastus lateralis, rectus femoris, vastus intermedius  Electrical Stimulation Performed: No Treatment Response/Outcome: twitch response elicited    Manual Therapy: Skilled palpation of trigger points  Neuromuscular re-ed: Supine TA march x 10  90/90 toe tap 2 x 10  Hip bridge  2 x 10  Paloff press 2 x 10; red band   Self Care: Recommended gradual return to walking as part of exercise routine Recommendations on low impact strength training    [facility] Adult PT Treatment:                                                DATE: 10/27/23 Therapeutic Exercise: Reviewed HEP Manual Therapy: Skilled palpation of trigger points Trigger Point Dry Needling  Subsequent Treatment: Instructions provided previously at initial dry needling treatment.   Patient Verbal Consent Given: Yes Education Handout Provided: Previously Provided Muscles Treated: bilateral gluteals and piriformis  Electrical Stimulation Performed: No Treatment Response/Outcome: twitch response elicited    Self Care: Anatomy of current condition and rationale for interventions Posture education    [facility] Adult PT Treatment:                                                DATE: 10/24/23 Therapeutic Exercise: Reviewed and updated HEP discussing frequency, sets, reps  Manual Therapy: Skilled palpation of trigger points STM Lt gluteals, quadriceps, gastroc/soleus  Trigger Point Dry Needling  Subsequent Treatment: Instructions provided previously at initial dry needling treatment.   Patient Verbal Consent Given: Yes Education Handout Provided: Previously Provided Muscles Treated: Lt quadriceps, gastroc/soleus  Electrical Stimulation Performed: No Treatment Response/Outcome: twitch response elicited    Neuromuscular Re-education: Supine bent knee fallout x 10  Supine TA march 2 x 10  Quadruped leg extension x 10                              PATIENT EDUCATION:  Education details:  HEP review; TPDN Person educated: Patient Education method: Medical illustrator, cues, handout  Education comprehension: verbalized understanding, returned demo   HOME EXERCISE PROGRAM: Access Code: [email or web address] URL: [email or web address] Date: 11/03/2023 Prepared by: Lucie Meeter  Exercises - Hooklying Hamstring Stretch with Strap  - 2 x daily - 7 x weekly - 1 sets - 3 reps - 30 sec  hold - Supine ITB Stretch with Strap  - 2 x daily - 7 x weekly - 1 sets - 3 reps - 30 sec  hold - Prone Quadriceps Stretch with Strap (Mirrored)  - 2 x daily - 7 x weekly - 1 sets - 3 reps - 60 sec hold - Gastroc Stretch on Wall  - 2 x daily - 7 x weekly - 1 sets - 3 reps - 30 sec  hold - Soleus Stretch on Wall  - 2 x daily - 7 x weekly - 1 sets - 3 reps - 30 sec  hold - Seated Hamstring Stretch  - 2 x daily - 7 x weekly - 1 sets - 3 reps - 30 sec  hold - Hip Flexor Stretch on Step  - 2 x daily - 7 x weekly - 1 sets - 3 reps - 30-60 sec hold - Clam with Resistance  - 1 x daily - 3 x weekly - 2-3 sets - 10 reps - Standing Hip Abduction with Resistance at Ankles and Counter Support  - 1 x daily - 3 x weekly - 2 sets - 10 reps - Standing Hip  Extension with Resistance at THIGHS and Counter Support  - 1 x daily - 3 x weekly - 2 sets - 10 reps - Squat with Chair Touch and Resistance Loop  - 1 x daily - 3 x weekly - 2 sets - 10 reps - Prone Press Up  - 6 x daily - 7 x weekly - 1 sets - 10 reps - Standing Lumbar Extension  - 6 x daily - 7 x weekly - 1 sets - 10 reps - Supine Sciatic Nerve Glide  - 1 x daily - 7 x weekly - 1 sets - 10 reps - Bent Knee Fallouts  - 1 x daily - 7 x weekly - 2 sets - 10 reps - Standing Anti-Rotation Press with Anchored Resistance  - 1 x daily - 7 x weekly - 2 sets - 10 reps - Supine 90/90 Alternating Heel Touches with Posterior Pelvic Tilt  - 1 x daily - 7 x weekly - 2 sets - 10 reps - Supine Bridge  - 1 x daily - 7 x  weekly - 2 sets - 10 reps  ASSESSMENT:  CLINICAL IMPRESSION: Patient arrives with mild low back pain, but has noticed occasional radiating pain into bilateral lateral thighs over the past few days. Further TPDN performed to posterior hip musculature and quadriceps with twitch response elicited. Continued with core stabilization with patient challenged in maintaining TA activation with 90/90 dynamic exercise. No increase in back pain reported with strength progression.    EVAL: Patient is a 53 y.o. female who was seen today for physical therapy evaluation and treatment for OA L knee. She reports L knee with no known injury. Symptoms have been present over the past 2-3 months with no known injury. She does report that she had a fracture of L ankle in 5th grade with delayed diagnosis and subsequent casting x 8 weeks NWB. Ankle fracture resolved with no further trouble. Of note, patient has decreased L ankle and hip mobility; poor tracking of L patella, decreased strength L LE; pain with sitting at her desk and walking, especially stairs. Patient stands with bilat knees hyperextended. She has pain with functional activities especially when sitting at her desk for extended periods of time. Patient will benefit from PT to address deficits identified.   OBJECTIVE IMPAIRMENTS: decreased activity tolerance, decreased balance, decreased strength, impaired flexibility, improper body mechanics, and pain.   GOALS: Goals reviewed with patient? Yes  SHORT TERM GOALS: Target date: 08/11/2023   Independent in initial HEP  Baseline: Goal status: MET  2.  Patient to report sitting at her desk for 2 hours without experencing pain =/> 1/10 Baseline:  09/28/23: 1 hour  10/27/23: reports 2 hours sitting without knee pain.  Goal status: MET  3.  Patient reports and demonstrates appropriate posture and alignment with re- to: office ergonomics with good position of LE's; avoid sitting with ankle/knee crossed; avoid  standing with knees hyperextended  Baseline:  09/28/23: improved awareness Goal status: MET   LONG TERM GOALS: Target date: 11/26/23   Decrease pain L knee by 80-100% allowing patient to return to all normal functional activities  Baseline:  09/28/23: 30% decrease  10/27/23: 80% decrease  Goal status: progressing  2.  5/5 strength bilat hips;knees; ankles  Baseline:  Goal status: partially met    3.  Improve single leg stance time on L LE to 10 or more seconds with no upper limb support  Baseline:  Goal status: MET  4.  Patient reports ability  to sit at her desk for typical work day with minima to no knee pain Baseline: almost constant pulling pain 09/28/23: 6 at worst  10/27/23: able to complete work day without knee pain  Goal status: MET  5.  Independent in HEP including aquatic therapy as indicated  Baseline:  Goal status: ongoing   6.  Improve LEFS Left knee by 10-15 points  Baseline:  Goal status: MET    7. Patient will report at least a 50% improvement in low back pain with seated activity to improve her tolerance to work activity.  Baseline: 8 at worst  Goal status: NEW  8. Patient will demonstrate proper core engagement with transfers and functional activity to reduce stress on her back.   Baseline: no knowledge, increased pain with sit to stand  Goal status: NEW  PLAN:  PT FREQUENCY: 1x/week  PT DURATION: 4 weeks  PLANNED INTERVENTIONS: [id number]- PT Re-evaluation, 97110-Therapeutic exercises, 97530- Therapeutic activity, [id number]- Neuromuscular re-education, 97535- Self Care, [id number]- Manual therapy, [id number]- Gait training, [phone]- Electrical stimulation (unattended), [id number]- Electrical stimulation (manual), 20560 (1-2 muscles), [id number] (3+ muscles)- Dry Needling, Stair training, Taping, Joint mobilization, Cryotherapy, and Moist heat  PLAN FOR NEXT SESSION: continue DN as needed, continue hip and knee strength; Core stabilization.   Briya Lookabaugh, PT, DPT,  ATC 11/03/23 2:10 PM

## 2023-11-08 ENCOUNTER — Encounter: Payer: Self-pay | Admitting: Sports Medicine

## 2023-11-10 ENCOUNTER — Ambulatory Visit: Attending: Sports Medicine

## 2023-11-10 DIAGNOSIS — M6281 Muscle weakness (generalized): Secondary | ICD-10-CM | POA: Diagnosis not present

## 2023-11-10 DIAGNOSIS — R29898 Other symptoms and signs involving the musculoskeletal system: Secondary | ICD-10-CM | POA: Insufficient documentation

## 2023-11-10 DIAGNOSIS — M5459 Other low back pain: Secondary | ICD-10-CM | POA: Insufficient documentation

## 2023-11-10 DIAGNOSIS — M25562 Pain in left knee: Secondary | ICD-10-CM | POA: Diagnosis not present

## 2023-11-10 NOTE — Therapy (Addendum)
 OUTPATIENT PHYSICAL THERAPY TREATMENT PHYSICAL THERAPY DISCHARGE SUMMARY  Visits from Start of Care: 12  Current functional level related to goals / functional outcomes: See goals below    Remaining deficits: Status unknown   Education / Equipment: N/A   Patient agrees to discharge. Patient goals were partially met. Patient is being discharged due to not returning since the last visit.     Patient Name: Emily Mclean MRN: [id number] DOB:1970/06/10, 53 y.o., female Today's Date: 11/10/2023  END OF SESSION:  PT End of Session - 11/10/23 1018     Visit Number 12    Number of Visits 14    Date for PT Re-Evaluation 11/26/23    Authorization Type [employer] of [place]    Authorization Time Period 8/26-9/24    Authorization - Visit Number 2    Authorization - Number of Visits 3    PT Start Time 1018    PT Stop Time 1059    PT Time Calculation (min) 41 min    Activity Tolerance Patient tolerated treatment well    Behavior During Therapy WFL for tasks assessed/performed                   Past Medical History:  Diagnosis Date   Allergic rhinitis    Allergy    Chronic allergic conjunctivitis    Generalized abdominal pain 11/08/2018   GERD (gastroesophageal reflux disease)    Hallux valgus (acquired), left foot 03/28/2019   Heart murmur    Thyroid  disease    Urticaria due to cold and heat    Past Surgical History:  Procedure Laterality Date   MANDIBLE SURGERY     NASAL SINUS SURGERY     polyps removed   Patient Active Problem List   Diagnosis Date Noted   Lumbar spondylosis 10/24/2023   Primary osteoarthritis of left knee 07/08/2023   Allergic rhinitis 02/15/2020   Chronic allergic conjunctivitis 02/15/2020   Urticaria due to cold and heat 02/15/2020   Hypocalcemia 11/02/2019   Hypothyroidism 11/02/2019    PCP: Dr Charlies DELENA Bellini  REFERRING PROVIDER: Dr Debby Petties   REFERRING DIAG: OA L knee  M47.816 (ICD-10-CM) - Lumbar spondylosis   THERAPY DIAG:  Acute pain of left knee  Other symptoms and signs involving the musculoskeletal system  Muscle weakness (generalized)  Other low back pain  Rationale for Evaluation and Treatment: Rehabilitation  ONSET DATE: 04/09/23  SUBJECTIVE:   SUBJECTIVE STATEMENT: The back was hurting this morning, but feeling better since chiropractor appointment. The Lt quad is feeling tight and the Lt knee is hurting a little bit.   PERTINENT HISTORY: Fracture of growth plate L ankle in 5th grade - walked on L LE without diagnosis for ~ 2 weeks. Xrays showed fx - casted for 8 weeks NWB Denies any medical or musculoskeletal problems  PAIN:  Are you having pain? Yes: NPRS scale:0.5  Pain location: Lt knee Pain description: ache Aggravating factors: standing, walking  Relieving factors: rest  PRECAUTIONS: None  RED FLAGS: None   WEIGHT BEARING RESTRICTIONS: No  FALLS:  Has patient fallen in last 6 months? 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  DIAGNOSTIC FINDINGS: xray- showed mild tricompartmental osteoarthritis.   PATIENT SURVEYS:  LEFS 19/80; 23.8% 09/28/23: 48/80  SENSATION: WFL  EDEMA:  No noticeable   MUSCLE LENGTH: Hamstrings: Right 75 deg; Left 70 deg Thomas test: Right 10 deg; Left neutral   09/28/23: hamstring 90/90: Lt lacking 53, Rt lacking 30  POSTURE: rounded shoulders and increased thoracic kyphosis; stands with bilat knees hyperextended; excessive  lumbar lordosis   PALPATION: Tenderness L knee - medial joint line; lateral head of the gastrocnemius; less through the distal lateral hamstring;   10/27/23: pain and hypermobility PAIVM lumbar spine; tautness lumbar paraspinals, gluteals  LUMBAR ROM:   Active  A/PROM  eval 10/27/23  Flexion 25%, pain lateral thigh 25% limited pain into Lt buttock  Extension WNL  25% limited LBP  Right lateral flexion 25% limited; LBP 25% limited; LBP  Left lateral flexion 25% limited; LBP 25% limited; LBP  Right rotation WNL WNL  Left rotation WNL WNL   (Blank rows = not tested)  LOWER EXTREMITY ROM: WFL's - tight hip rotation L compared to R; bilat knee ROM equal and pain free   Active ROM Right 09/28/23 Left 09/28/23  Hip flexion    Hip extension    Hip abduction    Hip adduction    Hip internal rotation    Hip external rotation    Knee flexion WNL WNL  Knee extension WNL WNL  Ankle dorsiflexion 13 8  Ankle plantarflexion    Ankle inversion    Ankle eversion     (Blank rows = not tested)  LOWER EXTREMITY MMT:  MMT Right eval Left eval Left 08/11/23 09/28/23 Left  10/27/23  Hip flexion 5 4 4+ 5   Hip extension 4+ 4 4+ 4+ 5 bilateral   Hip abduction 5 4 5  4+ Rt: 4; Lt: 4+  Hip adduction   4+    Hip internal rotation       Hip external rotation       Knee flexion 5 4+ 5 4+ Lt: 4+; Rt: 5   Knee extension 5 5 5  4+ Lt:5; Rt: 5  Ankle dorsiflexion  Decreased compared to R; Tight   5 bilateral   Ankle plantarflexion     SL calf raise   Ankle inversion  Decreased compared to R; tight      Ankle eversion     5 bilateral    (Blank rows = not tested)  FUNCTIONAL TESTS:  5 times sit to stand: 14.43 sec knees adducted  SLS R 10 sec; L 8 sec, more difficult  09/28/23: SLS: LLE 30 seconds   SPECIAL TESTS: 10/17/23: + SLR LLE  10/27/23: (-) SLR   GAIT: Distance walked: 40 feet Assistive device utilized: None Level of assistance: Complete Independence Comments: WFL's; no notable limp L  LE  [facility] Adult PT Treatment:                                                DATE: 11/10/23 Therapeutic Exercise: Seated figure 4 x 20 sec each  Physioball rollout 3 way x 1 minute Seated hip abduction black band 2 x 10  Manual Therapy: Skilled palpation  of trigger points Trigger Point Dry Needling  Subsequent Treatment: Instructions reviewed, if requested by the patient, prior to subsequent dry needling treatment.   Patient Verbal Consent Given: Yes Education Handout Provided: Previously Provided Muscles Treated: Lt vastus lateralis, intermedius, medialis, rectus femoris  Electrical Stimulation Performed: No Treatment Response/Outcome: twitch response elicited    Neuromuscular re-ed: Pelvic tilts on physioball 2 x 10  Pelvic clocks on physioball 2 x 10 CW/CCW March on physioball 2 x 10   Self Care: Recommendation on physioball for intermittent use as office chair  Posture education    [facility] Adult PT Treatment:                                                DATE: 11/03/23 Therapeutic Exercise: Reviewed and updated HEP Trigger Point Dry Needling  Subsequent Treatment: Instructions provided previously at initial dry needling treatment.   Patient Verbal Consent Given: Yes Education Handout Provided: Previously Provided Muscles Treated: bilateral gluteals, piriformis, vastus lateralis, rectus femoris, vastus intermedius  Electrical Stimulation Performed: No Treatment Response/Outcome: twitch response elicited    Manual Therapy: Skilled palpation of trigger points  Neuromuscular re-ed: Supine TA march x 10  90/90 toe tap 2 x 10  Hip bridge 2 x 10  Paloff press 2 x 10; red band   Self Care: Recommended gradual return to walking as part of exercise routine Recommendations on low impact strength training    [facility] Adult PT Treatment:                                                DATE: 10/27/23 Therapeutic Exercise: Reviewed HEP Manual Therapy: Skilled palpation of trigger  points Trigger Point Dry Needling  Subsequent Treatment: Instructions provided previously at initial dry needling treatment.   Patient Verbal Consent Given: Yes Education Handout Provided: Previously Provided Muscles Treated: bilateral gluteals and piriformis  Electrical Stimulation Performed: No Treatment Response/Outcome: twitch response elicited    Self Care: Anatomy of current condition and rationale for interventions Posture education    [facility] Adult PT Treatment:                                                DATE: 10/24/23 Therapeutic Exercise: Reviewed and updated HEP discussing frequency, sets, reps  Manual Therapy: Skilled palpation of trigger points STM Lt gluteals, quadriceps, gastroc/soleus  Trigger Point Dry Needling  Subsequent Treatment: Instructions provided previously at initial dry needling treatment.   Patient Verbal Consent Given: Yes Education Handout Provided: Previously Provided Muscles Treated: Lt quadriceps, gastroc/soleus  Electrical Stimulation Performed: No Treatment Response/Outcome: twitch response elicited    Neuromuscular Re-education: Supine bent knee fallout x 10  Supine TA march 2 x 10  Quadruped leg extension x 10                             PATIENT EDUCATION:  Education details:  HEP update; TPDN Person educated: Patient Education method: Medical Illustrator, cues, handout  Education comprehension: verbalized understanding, returned demo   HOME EXERCISE PROGRAM: Access Code: [email or web address]  URL: [email or web address] Date: 11/10/2023 Prepared by: Lucie Meeter  Exercises - Hooklying Hamstring Stretch with Strap  - 2 x daily - 7 x weekly - 1 sets - 3 reps - 30 sec  hold - Supine ITB Stretch with Strap  - 2 x daily - 7 x weekly - 1 sets - 3 reps - 30 sec  hold - Prone Quadriceps Stretch with Strap (Mirrored)  - 2 x daily - 7 x weekly - 1 sets - 3 reps - 60 sec hold - Gastroc Stretch on Wall  - 2 x daily - 7 x  weekly - 1 sets - 3 reps - 30 sec  hold - Soleus Stretch on Wall  - 2 x daily - 7 x weekly - 1 sets - 3 reps - 30 sec  hold - Seated Hamstring Stretch  - 2 x daily - 7 x weekly - 1 sets - 3 reps - 30 sec  hold - Hip Flexor Stretch on Step  - 2 x daily - 7 x weekly - 1 sets - 3 reps - 30-60 sec hold - Clam with Resistance  - 1 x daily - 3 x weekly - 2-3 sets - 10 reps - Standing Hip Abduction with Resistance at Ankles and Counter Support  - 1 x daily - 3 x weekly - 2 sets - 10 reps - Standing Hip Extension with Resistance at THIGHS and Counter Support  - 1 x daily - 3 x weekly - 2 sets - 10 reps - Squat with Chair Touch and Resistance Loop  - 1 x daily - 3 x weekly - 2 sets - 10 reps - Prone Press Up  - 6 x daily - 7 x weekly - 1 sets - 10 reps - Standing Lumbar Extension  - 6 x daily - 7 x weekly - 1 sets - 10 reps - Supine Sciatic Nerve Glide  - 1 x daily - 7 x weekly - 1 sets - 10 reps - Bent Knee Fallouts  - 1 x daily - 7 x weekly - 2 sets - 10 reps - Standing Anti-Rotation Press with Anchored Resistance  - 1 x daily - 7 x weekly - 2 sets - 10 reps - Supine 90/90 Alternating Heel Touches with Posterior Pelvic Tilt  - 1 x daily - 7 x weekly - 2 sets - 10 reps - Supine Bridge  - 1 x daily - 7 x weekly - 2 sets - 10 reps - Pelvic Tilt on Swiss Ball  - 1 x daily - 7 x weekly - 2 sets - 10 reps - Pelvic Circles on Swiss Ball  - 1 x daily - 7 x weekly - 2 sets - 10 reps - Swiss Ball March  - 1 x daily - 7 x weekly - 2 sets - 10 reps - Seated 3 Way Exercise [employer] Stretch  - 1 x daily - 7 x weekly - 2 sets - 10 reps - Seated Hip Abduction with Resistance  - 1 x daily - 7 x weekly - 2 sets - 10 reps  ASSESSMENT:  CLINICAL IMPRESSION: TPDN performed to Lt quadriceps, which resolved her Lt knee pain at beginning of session. Introduced core stabilization on physioball with patient challenged in maintaining stability during dynamic tasks. Encouraged patient to obtain physioball for further core  strengthening at home as well as it's use as an alternative chair for desk work with patient verbalizing understanding. No reports of back pain throughout session.  EVAL: Patient is a 53 y.o. female who was seen today for physical therapy evaluation and treatment for OA L knee. She reports L knee with no known injury. Symptoms have been present over the past 2-3 months with no known injury. She does report that she had a fracture of L ankle in 5th grade with delayed diagnosis and subsequent casting x 8 weeks NWB. Ankle fracture resolved with no further trouble. Of note, patient has decreased L ankle and hip mobility; poor tracking of L patella, decreased strength L LE; pain with sitting at her desk and walking, especially stairs. Patient stands with bilat knees hyperextended. She has pain with functional activities especially when sitting at her desk for extended periods of time. Patient will benefit from PT to address deficits identified.   OBJECTIVE IMPAIRMENTS: decreased activity tolerance, decreased balance, decreased strength, impaired flexibility, improper body mechanics, and pain.   GOALS: Goals reviewed with patient? Yes  SHORT TERM GOALS: Target date: 08/11/2023   Independent in initial HEP  Baseline: Goal status: MET  2.  Patient to report sitting at her desk for 2 hours without experencing pain =/> 1/10 Baseline:  09/28/23: 1 hour  10/27/23: reports 2 hours sitting without knee pain.  Goal status: MET  3.  Patient reports and demonstrates appropriate posture and alignment with re- to: office ergonomics with good position of LE's; avoid sitting with ankle/knee crossed; avoid standing with knees hyperextended  Baseline:  09/28/23: improved awareness Goal status: MET   LONG TERM GOALS: Target date: 11/26/23   Decrease pain L knee by 80-100% allowing patient to return to all normal functional activities  Baseline:  09/28/23: 30% decrease  10/27/23: 80% decrease  Goal status:  progressing  2.  5/5 strength bilat hips;knees; ankles  Baseline:  Goal status: partially met    3.  Improve single leg stance time on L LE to 10 or more seconds with no upper limb support  Baseline:  Goal status: MET  4.  Patient reports ability to sit at her desk for typical work day with minima to no knee pain Baseline: almost constant pulling pain 09/28/23: 6 at worst  10/27/23: able to complete work day without knee pain  Goal status: MET  5.  Independent in HEP including aquatic therapy as indicated  Baseline:  Goal status: ongoing   6.  Improve LEFS Left knee by 10-15 points  Baseline:  Goal status: MET    7. Patient will report at least a 50% improvement in low back pain with seated activity to improve her tolerance to work activity.  Baseline: 8 at worst  Goal status: NEW  8. Patient will demonstrate proper core engagement with transfers and functional activity to reduce stress on her back.   Baseline: no knowledge, increased pain with sit to stand  Goal status: NEW  PLAN:  PT FREQUENCY: 1x/week  PT DURATION: 4 weeks  PLANNED INTERVENTIONS: [id number]- PT Re-evaluation, 97110-Therapeutic exercises, 97530- Therapeutic activity, [id number]- Neuromuscular re-education, 97535- Self Care, [id number]- Manual therapy, [id number]- Gait training, [phone]- Electrical stimulation (unattended), [id number]- Electrical stimulation (manual), 20560 (1-2 muscles), [id number] (3+ muscles)- Dry Needling, Stair training, Taping, Joint mobilization, Cryotherapy, and Moist heat  PLAN FOR NEXT SESSION: continue DN as needed, continue hip and knee strength; Core stabilization.   Eliska Hamil, PT, DPT, ATC 11/10/23 11:03 AM  Jaman Aro, PT, DPT, ATC 02/13/24 2:14 PM

## 2023-11-25 DIAGNOSIS — Z23 Encounter for immunization: Secondary | ICD-10-CM | POA: Diagnosis not present

## 2023-11-28 ENCOUNTER — Ambulatory Visit: Payer: Self-pay

## 2023-11-28 NOTE — Therapy (Incomplete)
 OUTPATIENT PHYSICAL THERAPY TREATMENT PHYSICAL THERAPY DISCHARGE SUMMARY  Visits from Start of Care: 13  Current functional level related to goals / functional outcomes: See goals below   Remaining deficits: See impression below    Education / Equipment: See education below    Patient agrees to discharge. Patient goals were {OP Goals:25702::met}. Patient is being discharged due to {OP Discharge Reasons:25703::meeting the stated rehab goals.}     Patient Name: Emily Mclean MRN: [id number] DOB:1970/10/08, 53 y.o., female Today's Date: 11/28/2023  END OF SESSION:             Past Medical History:  Diagnosis Date   Allergic rhinitis    Allergy    Chronic allergic conjunctivitis    Generalized abdominal pain 11/08/2018   GERD (gastroesophageal reflux disease)    Hallux valgus (acquired), left foot 03/28/2019   Heart murmur    Thyroid  disease    Urticaria due to cold and heat    Past Surgical History:  Procedure Laterality Date   MANDIBLE SURGERY     NASAL SINUS SURGERY     polyps removed   Patient Active Problem List   Diagnosis Date Noted   Lumbar spondylosis 10/24/2023   Primary osteoarthritis of left knee 07/08/2023   Allergic rhinitis 02/15/2020   Chronic allergic conjunctivitis 02/15/2020   Urticaria due to cold and heat 02/15/2020   Hypocalcemia 11/02/2019   Hypothyroidism 11/02/2019    PCP: Dr Charlies DELENA Bellini  REFERRING PROVIDER: Dr Debby Petties   REFERRING DIAG: OA L knee  M47.816 (ICD-10-CM) - Lumbar spondylosis  THERAPY DIAG:  No diagnosis found.  Rationale for Evaluation and Treatment: Rehabilitation  ONSET DATE: 04/09/23  SUBJECTIVE:   SUBJECTIVE STATEMENT: The back was hurting this morning, but feeling better since chiropractor appointment. The Lt quad is feeling tight and the Lt knee is hurting a little bit.   PERTINENT HISTORY: Fracture of growth plate L ankle in 5th grade - walked on L LE without diagnosis  for ~ 2 weeks. Xrays showed fx - casted for 8 weeks NWB Denies any medical or musculoskeletal problems  PAIN:  Are you having pain? Yes: NPRS scale:0.5  Pain location: Lt knee Pain description: ache Aggravating factors: standing, walking  Relieving factors: rest  PRECAUTIONS: None  RED FLAGS: None   WEIGHT BEARING RESTRICTIONS: No  FALLS:  Has patient fallen in last 6 months? 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  DIAGNOSTIC FINDINGS: xray- showed mild tricompartmental osteoarthritis.   PATIENT SURVEYS:  LEFS 19/80; 23.8% 09/28/23: 48/80  SENSATION: WFL  EDEMA:  No noticeable   MUSCLE LENGTH: Hamstrings: Right 75 deg; Left 70 deg Thomas test: Right 10 deg; Left neutral   09/28/23: hamstring 90/90: Lt lacking 53, Rt lacking 30  POSTURE: rounded shoulders and increased thoracic kyphosis; stands with bilat knees hyperextended; excessive lumbar lordosis   PALPATION: Tenderness L knee - medial joint line; lateral head of the gastrocnemius; less through the distal lateral hamstring;  10/27/23: pain and hypermobility PAIVM lumbar spine; tautness lumbar paraspinals, gluteals  LUMBAR ROM:   Active  A/PROM  eval 10/27/23  Flexion 25%, pain lateral thigh 25% limited pain into Lt buttock  Extension WNL  25% limited LBP  Right lateral flexion 25% limited; LBP 25% limited; LBP  Left lateral flexion 25% limited; LBP 25% limited; LBP  Right rotation WNL WNL  Left rotation  WNL WNL   (Blank rows = not tested)  LOWER EXTREMITY ROM: WFL's - tight hip rotation L compared to R; bilat knee ROM equal and pain free   Active ROM Right 09/28/23 Left 09/28/23  Hip flexion    Hip extension    Hip abduction    Hip adduction    Hip internal rotation    Hip external rotation    Knee flexion WNL WNL  Knee extension WNL WNL  Ankle dorsiflexion 13 8  Ankle plantarflexion    Ankle inversion    Ankle eversion     (Blank rows = not tested)  LOWER EXTREMITY MMT:  MMT Right eval Left eval Left 08/11/23 09/28/23 Left  10/27/23  Hip flexion 5 4 4+ 5   Hip extension 4+ 4 4+ 4+ 5 bilateral   Hip abduction 5 4 5  4+ Rt: 4; Lt: 4+  Hip adduction   4+    Hip internal rotation       Hip external rotation       Knee flexion 5 4+ 5 4+ Lt: 4+; Rt: 5   Knee extension 5 5 5  4+ Lt:5; Rt: 5  Ankle dorsiflexion  Decreased compared to R; Tight   5 bilateral   Ankle plantarflexion     SL calf raise   Ankle inversion  Decreased compared to R; tight      Ankle eversion     5 bilateral    (Blank rows = not tested)  FUNCTIONAL TESTS:  5 times sit to stand: 14.43 sec knees adducted  SLS R 10 sec; L 8 sec, more difficult  09/28/23: SLS: LLE 30 seconds   SPECIAL TESTS: 10/17/23: + SLR LLE  10/27/23: (-) SLR   GAIT: Distance walked: 40 feet Assistive device utilized: None Level of assistance: Complete Independence Comments: WFL's; no notable limp L LE  [facility] Adult PT Treatment:                                                DATE: 11/28/23 Therapeutic Exercise: *** Manual Therapy: *** Neuromuscular re-ed: *** Therapeutic Activity: *** Modalities: *** Self Care: ***   [facility] Adult PT Treatment:                                                DATE: 11/10/23 Therapeutic Exercise: Seated figure 4 x 20 sec each  Physioball rollout 3 way x 1 minute Seated hip abduction black band 2 x 10  Manual Therapy: Skilled palpation of trigger points Trigger Point Dry  Needling  Subsequent Treatment: Instructions reviewed, if requested by the patient, prior to subsequent dry needling treatment.   Patient Verbal Consent Given: Yes Education Handout Provided: Previously Provided Muscles Treated: Lt vastus lateralis, intermedius, medialis, rectus femoris  Electrical Stimulation Performed: No Treatment Response/Outcome: twitch response elicited  Neuromuscular re-ed: Pelvic tilts on physioball 2 x 10  Pelvic clocks on physioball 2 x 10 CW/CCW March on physioball 2 x 10   Self Care: Recommendation on physioball for intermittent use as office chair  Posture education    [facility] Adult PT Treatment:                                                DATE: 11/03/23 Therapeutic Exercise: Reviewed and updated HEP Trigger Point Dry Needling  Subsequent Treatment: Instructions provided previously at initial dry needling treatment.   Patient Verbal Consent Given: Yes Education Handout Provided: Previously Provided Muscles Treated: bilateral gluteals, piriformis, vastus lateralis, rectus femoris, vastus intermedius  Electrical Stimulation Performed: No Treatment Response/Outcome: twitch response elicited    Manual Therapy: Skilled palpation of trigger points  Neuromuscular re-ed: Supine TA march x 10  90/90 toe tap 2 x 10  Hip bridge 2 x 10  Paloff press 2 x 10; red band   Self Care: Recommended gradual return to walking as part of exercise routine Recommendations on low impact strength training    [facility] Adult PT Treatment:                                                DATE: 10/27/23 Therapeutic Exercise: Reviewed HEP Manual Therapy: Skilled palpation of trigger points Trigger Point Dry Needling  Subsequent Treatment: Instructions provided previously at initial dry needling treatment.   Patient Verbal Consent Given: Yes Education Handout Provided: Previously Provided Muscles Treated: bilateral gluteals and piriformis  Electrical Stimulation  Performed: No Treatment Response/Outcome: twitch response elicited    Self Care: Anatomy of current condition and rationale for interventions Posture education    [facility] Adult PT Treatment:                                                DATE: 10/24/23 Therapeutic Exercise: Reviewed and updated HEP discussing frequency, sets, reps  Manual Therapy: Skilled palpation of trigger points STM Lt gluteals, quadriceps, gastroc/soleus  Trigger Point Dry Needling  Subsequent Treatment: Instructions provided previously at initial dry needling treatment.   Patient Verbal Consent Given: Yes Education Handout Provided: Previously Provided Muscles Treated: Lt quadriceps, gastroc/soleus  Electrical Stimulation Performed: No Treatment Response/Outcome: twitch response elicited    Neuromuscular Re-education: Supine bent knee fallout x 10  Supine TA march 2 x 10  Quadruped leg extension x 10                             PATIENT EDUCATION:  Education details:  HEP update; TPDN Person educated: Patient Education method: Medical illustrator, cues, handout  Education comprehension: verbalized understanding, returned demo   HOME EXERCISE PROGRAM: Access Code: [email or web address] URL: [email or web address] Date: 11/10/2023 Prepared by: Lucie Meeter  Exercises - Hooklying Hamstring Stretch with Strap  - 2 x daily - 7 x weekly - 1 sets - 3 reps - 30 sec  hold - Supine ITB Stretch with Strap  - 2 x daily - 7 x weekly - 1 sets - 3 reps -  30 sec  hold - Prone Quadriceps Stretch with Strap (Mirrored)  - 2 x daily - 7 x weekly - 1 sets - 3 reps - 60 sec hold - Gastroc Stretch on Wall  - 2 x daily - 7 x weekly - 1 sets - 3 reps - 30 sec  hold - Soleus Stretch on Wall  - 2 x daily - 7 x weekly - 1 sets - 3 reps - 30 sec  hold - Seated Hamstring Stretch  - 2 x daily - 7 x weekly - 1 sets - 3 reps - 30 sec  hold - Hip Flexor Stretch on Step  - 2 x daily - 7 x weekly - 1 sets - 3 reps -  30-60 sec hold - Clam with Resistance  - 1 x daily - 3 x weekly - 2-3 sets - 10 reps - Standing Hip Abduction with Resistance at Ankles and Counter Support  - 1 x daily - 3 x weekly - 2 sets - 10 reps - Standing Hip Extension with Resistance at THIGHS and Counter Support  - 1 x daily - 3 x weekly - 2 sets - 10 reps - Squat with Chair Touch and Resistance Loop  - 1 x daily - 3 x weekly - 2 sets - 10 reps - Prone Press Up  - 6 x daily - 7 x weekly - 1 sets - 10 reps - Standing Lumbar Extension  - 6 x daily - 7 x weekly - 1 sets - 10 reps - Supine Sciatic Nerve Glide  - 1 x daily - 7 x weekly - 1 sets - 10 reps - Bent Knee Fallouts  - 1 x daily - 7 x weekly - 2 sets - 10 reps - Standing Anti-Rotation Press with Anchored Resistance  - 1 x daily - 7 x weekly - 2 sets - 10 reps - Supine 90/90 Alternating Heel Touches with Posterior Pelvic Tilt  - 1 x daily - 7 x weekly - 2 sets - 10 reps - Supine Bridge  - 1 x daily - 7 x weekly - 2 sets - 10 reps - Pelvic Tilt on Swiss Ball  - 1 x daily - 7 x weekly - 2 sets - 10 reps - Pelvic Circles on Swiss Ball  - 1 x daily - 7 x weekly - 2 sets - 10 reps - Swiss Ball March  - 1 x daily - 7 x weekly - 2 sets - 10 reps - Seated 3 Way Exercise [employer] Stretch  - 1 x daily - 7 x weekly - 2 sets - 10 reps - Seated Hip Abduction with Resistance  - 1 x daily - 7 x weekly - 2 sets - 10 reps  ASSESSMENT:  CLINICAL IMPRESSION: TPDN performed to Lt quadriceps, which resolved her Lt knee pain at beginning of session. Introduced core stabilization on physioball with patient challenged in maintaining stability during dynamic tasks. Encouraged patient to obtain physioball for further core strengthening at home as well as it's use as an alternative chair for desk work with patient verbalizing understanding. No reports of back pain throughout session.    EVAL: Patient is a 53 y.o. female who was seen today for physical therapy evaluation and treatment for OA L knee. She  reports L knee with no known injury. Symptoms have been present over the past 2-3 months with no known injury. She does report that she had a fracture of L ankle in 5th grade  with delayed diagnosis and subsequent casting x 8 weeks NWB. Ankle fracture resolved with no further trouble. Of note, patient has decreased L ankle and hip mobility; poor tracking of L patella, decreased strength L LE; pain with sitting at her desk and walking, especially stairs. Patient stands with bilat knees hyperextended. She has pain with functional activities especially when sitting at her desk for extended periods of time. Patient will benefit from PT to address deficits identified.   OBJECTIVE IMPAIRMENTS: decreased activity tolerance, decreased balance, decreased strength, impaired flexibility, improper body mechanics, and pain.   GOALS: Goals reviewed with patient? Yes  SHORT TERM GOALS: Target date: 08/11/2023   Independent in initial HEP  Baseline: Goal status: MET  2.  Patient to report sitting at her desk for 2 hours without experencing pain =/> 1/10 Baseline:  09/28/23: 1 hour  10/27/23: reports 2 hours sitting without knee pain.  Goal status: MET  3.  Patient reports and demonstrates appropriate posture and alignment with re- to: office ergonomics with good position of LE's; avoid sitting with ankle/knee crossed; avoid standing with knees hyperextended  Baseline:  09/28/23: improved awareness Goal status: MET   LONG TERM GOALS: Target date: 11/26/23   Decrease pain L knee by 80-100% allowing patient to return to all normal functional activities  Baseline:  09/28/23: 30% decrease  10/27/23: 80% decrease  Goal status: progressing  2.  5/5 strength bilat hips;knees; ankles  Baseline:  Goal status: partially met    3.  Improve single leg stance time on L LE to 10 or more seconds with no upper limb support  Baseline:  Goal status: MET  4.  Patient reports ability to sit at her desk for typical work  day with minima to no knee pain Baseline: almost constant pulling pain 09/28/23: 6 at worst  10/27/23: able to complete work day without knee pain  Goal status: MET  5.  Independent in HEP including aquatic therapy as indicated  Baseline:  Goal status: ongoing   6.  Improve LEFS Left knee by 10-15 points  Baseline:  Goal status: MET    7. Patient will report at least a 50% improvement in low back pain with seated activity to improve her tolerance to work activity.  Baseline: 8 at worst  Goal status: NEW  8. Patient will demonstrate proper core engagement with transfers and functional activity to reduce stress on her back.   Baseline: no knowledge, increased pain with sit to stand  Goal status: NEW  PLAN:  PT FREQUENCY: 1x/week  PT DURATION: 4 weeks  PLANNED INTERVENTIONS: 97164- PT Re-evaluation, 97110-Therapeutic exercises, 97530- Therapeutic activity, [id number]- Neuromuscular re-education, 97535- Self Care, [id number]- Manual therapy, [id number]- Gait training, [id number]- Electrical stimulation (unattended), [id number]- Electrical stimulation (manual), 20560 (1-2 muscles), 20561 (3+ muscles)- Dry Needling, Stair training, Taping, Joint mobilization, Cryotherapy, and Moist heat  PLAN FOR NEXT SESSION: continue DN as needed, continue hip and knee strength; Core stabilization.   Alzena Gerber, PT, DPT, ATC 11/28/23 12:46 PM

## 2023-12-01 ENCOUNTER — Ambulatory Visit

## 2023-12-01 NOTE — Therapy (Incomplete)
 OUTPATIENT PHYSICAL THERAPY TREATMENT PHYSICAL THERAPY DISCHARGE SUMMARY  Visits from Start of Care: 13  Current functional level related to goals / functional outcomes: See goals below   Remaining deficits: See impression below    Education / Equipment: See education below    Patient agrees to discharge. Patient goals were {OP Goals:25702::met}. Patient is being discharged due to {OP Discharge Reasons:25703::meeting the stated rehab goals.}     Patient Name: Emily Mclean  Emily Mclean Mclean MRN: [id number] DOB:1970-05-15, 53 y.o., female Today's Date: 12/01/2023  END OF SESSION:             Past Medical History:  Diagnosis Date   Allergic rhinitis    Allergy    Chronic allergic conjunctivitis    Generalized abdominal pain 11/08/2018   GERD (gastroesophageal reflux disease)    Hallux valgus (acquired), left foot 03/28/2019   Heart murmur    Thyroid  disease    Urticaria due to cold and heat    Past Surgical History:  Procedure Laterality Date   MANDIBLE SURGERY     NASAL SINUS SURGERY     polyps removed   Patient Active Problem List   Diagnosis Date Noted   Lumbar spondylosis 10/24/2023   Primary osteoarthritis of left knee 07/08/2023   Allergic rhinitis 02/15/2020   Chronic allergic conjunctivitis 02/15/2020   Urticaria due to cold and heat 02/15/2020   Hypocalcemia 11/02/2019   Hypothyroidism 11/02/2019    PCP: Emily Mclean Mclean  REFERRING PROVIDER: Dr Debby Mclean   REFERRING DIAG: OA L knee  M47.816 (ICD-10-CM) - Lumbar spondylosis  THERAPY DIAG:  No diagnosis found.  Rationale for Evaluation and Treatment: Rehabilitation  ONSET DATE: 04/09/23  SUBJECTIVE:   SUBJECTIVE STATEMENT: The back was hurting this morning, but feeling better since chiropractor appointment. The Lt quad is feeling tight and the Lt knee is hurting a little bit.   PERTINENT HISTORY: Fracture of growth plate L ankle in 5th grade - walked on L LE without diagnosis  for ~ 2 weeks. Xrays showed fx - casted for 8 weeks NWB Denies any medical or musculoskeletal problems  PAIN:  Are you having pain? Yes: NPRS scale:0.5  Pain location: Lt knee Pain description: ache Aggravating factors: standing, walking  Relieving factors: rest  PRECAUTIONS: None  RED FLAGS: None   WEIGHT BEARING RESTRICTIONS: No  FALLS:  Has patient fallen in last 6 months? 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  DIAGNOSTIC FINDINGS: xray- showed mild tricompartmental osteoarthritis.   PATIENT SURVEYS:  LEFS 19/80; 23.8% 09/28/23: 48/80  SENSATION: WFL  EDEMA:  No noticeable   MUSCLE LENGTH: Hamstrings: Right 75 deg; Left 70 deg Thomas test: Right 10 deg; Left neutral   09/28/23: hamstring 90/90: Lt lacking 53, Rt lacking 30  POSTURE: rounded shoulders and increased thoracic kyphosis; stands with bilat knees hyperextended; excessive lumbar lordosis   PALPATION: Tenderness L knee - medial joint line; lateral head of the gastrocnemius; less through the distal lateral hamstring;  10/27/23: pain and hypermobility PAIVM lumbar spine; tautness lumbar paraspinals, gluteals  LUMBAR ROM:   Active  A/PROM  eval 10/27/23  Flexion 25%, pain lateral thigh 25% limited pain into Lt buttock  Extension WNL  25% limited LBP  Right lateral flexion 25% limited; LBP 25% limited; LBP  Left lateral flexion 25% limited; LBP 25% limited; LBP  Right rotation WNL WNL  Left rotation  WNL WNL   (Blank rows = not tested)  LOWER EXTREMITY ROM: WFL's - tight hip rotation L compared to R; bilat knee ROM equal and pain free   Active ROM Right 09/28/23 Left 09/28/23  Hip flexion    Hip extension    Hip abduction    Hip adduction    Hip internal rotation    Hip external rotation    Knee flexion WNL WNL  Knee extension WNL WNL  Ankle dorsiflexion 13 8  Ankle plantarflexion    Ankle inversion    Ankle eversion     (Blank rows = not tested)  LOWER EXTREMITY MMT:  MMT Right eval Left eval Left 08/11/23 09/28/23 Left  10/27/23  Hip flexion 5 4 4+ 5   Hip extension 4+ 4 4+ 4+ 5 bilateral   Hip abduction 5 4 5  4+ Rt: 4; Lt: 4+  Hip adduction   4+    Hip internal rotation       Hip external rotation       Knee flexion 5 4+ 5 4+ Lt: 4+; Rt: 5   Knee extension 5 5 5  4+ Lt:5; Rt: 5  Ankle dorsiflexion  Decreased compared to R; Tight   5 bilateral   Ankle plantarflexion     SL calf raise   Ankle inversion  Decreased compared to R; tight      Ankle eversion     5 bilateral    (Blank rows = not tested)  FUNCTIONAL TESTS:  5 times sit to stand: 14.43 sec knees adducted  SLS R 10 sec; L 8 sec, more difficult  09/28/23: SLS: LLE 30 seconds   SPECIAL TESTS: 10/17/23: + SLR LLE  10/27/23: (-) SLR   GAIT: Distance walked: 40 feet Assistive device utilized: None Level of assistance: Complete Independence Comments: WFL's; no notable limp L LE  [facility] Adult PT Treatment:                                                DATE: 11/28/23 Therapeutic Exercise: *** Manual Therapy: *** Neuromuscular re-ed: *** Therapeutic Activity: *** Modalities: *** Self Care: ***   Emily Mclean Mclean Adult PT Treatment:                                                DATE: 11/10/23 Therapeutic Exercise: Seated figure 4 x 20 sec each  Physioball rollout 3 way x 1 minute Seated hip abduction black band 2 x 10  Manual Therapy: Skilled palpation of trigger points Trigger Point Dry  Needling  Subsequent Treatment: Instructions reviewed, if requested by the patient, prior to subsequent dry needling treatment.   Patient Verbal Consent Given: Yes Education Handout Provided: Previously Provided Muscles Treated: Lt vastus lateralis, intermedius, medialis, rectus femoris  Electrical Stimulation Performed: No Treatment Response/Outcome: twitch response elicited  Neuromuscular re-ed: Pelvic tilts on physioball 2 x 10  Pelvic clocks on physioball 2 x 10 CW/CCW March on physioball 2 x 10   Self Care: Recommendation on physioball for intermittent use as office chair  Posture education    [facility] Adult PT Treatment:                                                DATE: 11/03/23 Therapeutic Exercise: Reviewed and updated HEP Trigger Point Dry Needling  Subsequent Treatment: Instructions provided previously at initial dry needling treatment.   Patient Verbal Consent Given: Yes Education Handout Provided: Previously Provided Muscles Treated: bilateral gluteals, piriformis, vastus lateralis, rectus femoris, vastus intermedius  Electrical Stimulation Performed: No Treatment Response/Outcome: twitch response elicited    Manual Therapy: Skilled palpation of trigger points  Neuromuscular re-ed: Supine TA march x 10  90/90 toe tap 2 x 10  Hip bridge 2 x 10  Paloff press 2 x 10; red band   Self Care: Recommended gradual return to walking as part of exercise routine Recommendations on low impact strength training    [facility] Adult PT Treatment:                                                DATE: 10/27/23 Therapeutic Exercise: Reviewed HEP Manual Therapy: Skilled palpation of trigger points Trigger Point Dry Needling  Subsequent Treatment: Instructions provided previously at initial dry needling treatment.   Patient Verbal Consent Given: Yes Education Handout Provided: Previously Provided Muscles Treated: bilateral gluteals and piriformis  Electrical Stimulation  Performed: No Treatment Response/Outcome: twitch response elicited    Self Care: Anatomy of current condition and rationale for interventions Posture education    [facility] Adult PT Treatment:                                                DATE: 10/24/23 Therapeutic Exercise: Reviewed and updated HEP discussing frequency, sets, reps  Manual Therapy: Skilled palpation of trigger points STM Lt gluteals, quadriceps, gastroc/soleus  Trigger Point Dry Needling  Subsequent Treatment: Instructions provided previously at initial dry needling treatment.   Patient Verbal Consent Given: Yes Education Handout Provided: Previously Provided Muscles Treated: Lt quadriceps, gastroc/soleus  Electrical Stimulation Performed: No Treatment Response/Outcome: twitch response elicited    Neuromuscular Re-education: Supine bent knee fallout x 10  Supine TA march 2 x 10  Quadruped leg extension x 10                             PATIENT EDUCATION:  Education details:  HEP update; TPDN Person educated: Patient Education method: Medical illustrator, cues, handout  Education comprehension: verbalized understanding, returned demo   HOME EXERCISE PROGRAM: Access Code: [email or web address] URL: [email or web address] Date: 11/10/2023 Prepared by: Lucie Meeter  Exercises - Hooklying Hamstring Stretch with Strap  - 2 x daily - 7 x weekly - 1 sets - 3 reps - 30 sec  hold - Supine ITB Stretch with Strap  - 2 x daily - 7 x weekly - 1 sets - 3 reps -  30 sec  hold - Prone Quadriceps Stretch with Strap (Mirrored)  - 2 x daily - 7 x weekly - 1 sets - 3 reps - 60 sec hold - Gastroc Stretch on Wall  - 2 x daily - 7 x weekly - 1 sets - 3 reps - 30 sec  hold - Soleus Stretch on Wall  - 2 x daily - 7 x weekly - 1 sets - 3 reps - 30 sec  hold - Seated Hamstring Stretch  - 2 x daily - 7 x weekly - 1 sets - 3 reps - 30 sec  hold - Hip Flexor Stretch on Step  - 2 x daily - 7 x weekly - 1 sets - 3 reps -  30-60 sec hold - Clam with Resistance  - 1 x daily - 3 x weekly - 2-3 sets - 10 reps - Standing Hip Abduction with Resistance at Ankles and Counter Support  - 1 x daily - 3 x weekly - 2 sets - 10 reps - Standing Hip Extension with Resistance at THIGHS and Counter Support  - 1 x daily - 3 x weekly - 2 sets - 10 reps - Squat with Chair Touch and Resistance Loop  - 1 x daily - 3 x weekly - 2 sets - 10 reps - Prone Press Up  - 6 x daily - 7 x weekly - 1 sets - 10 reps - Standing Lumbar Extension  - 6 x daily - 7 x weekly - 1 sets - 10 reps - Supine Sciatic Nerve Glide  - 1 x daily - 7 x weekly - 1 sets - 10 reps - Bent Knee Fallouts  - 1 x daily - 7 x weekly - 2 sets - 10 reps - Standing Anti-Rotation Press with Anchored Resistance  - 1 x daily - 7 x weekly - 2 sets - 10 reps - Supine 90/90 Alternating Heel Touches with Posterior Pelvic Tilt  - 1 x daily - 7 x weekly - 2 sets - 10 reps - Supine Bridge  - 1 x daily - 7 x weekly - 2 sets - 10 reps - Pelvic Tilt on Swiss Ball  - 1 x daily - 7 x weekly - 2 sets - 10 reps - Pelvic Circles on Swiss Ball  - 1 x daily - 7 x weekly - 2 sets - 10 reps - Swiss Ball March  - 1 x daily - 7 x weekly - 2 sets - 10 reps - Seated 3 Way Exercise [employer] Stretch  - 1 x daily - 7 x weekly - 2 sets - 10 reps - Seated Hip Abduction with Resistance  - 1 x daily - 7 x weekly - 2 sets - 10 reps  ASSESSMENT:  CLINICAL IMPRESSION: TPDN performed to Lt quadriceps, which resolved her Lt knee pain at beginning of session. Introduced core stabilization on physioball with patient challenged in maintaining stability during dynamic tasks. Encouraged patient to obtain physioball for further core strengthening at home as well as it's use as an alternative chair for desk work with patient verbalizing understanding. No reports of back pain throughout session.    EVAL: Patient is a 53 y.o. female who was seen today for physical therapy evaluation and treatment for OA L knee. She  reports L knee with no known injury. Symptoms have been present over the past 2-3 months with no known injury. She does report that she had a fracture of L ankle in 5th grade  with delayed diagnosis and subsequent casting x 8 weeks NWB. Ankle fracture resolved with no further trouble. Of note, patient has decreased L ankle and hip mobility; poor tracking of L patella, decreased strength L LE; pain with sitting at her desk and walking, especially stairs. Patient stands with bilat knees hyperextended. She has pain with functional activities especially when sitting at her desk for extended periods of time. Patient will benefit from PT to address deficits identified.   OBJECTIVE IMPAIRMENTS: decreased activity tolerance, decreased balance, decreased strength, impaired flexibility, improper body mechanics, and pain.   GOALS: Goals reviewed with patient? Yes  SHORT TERM GOALS: Target date: 08/11/2023   Independent in initial HEP  Baseline: Goal status: MET  2.  Patient to report sitting at her desk for 2 hours without experencing pain =/> 1/10 Baseline:  09/28/23: 1 hour  10/27/23: reports 2 hours sitting without knee pain.  Goal status: MET  3.  Patient reports and demonstrates appropriate posture and alignment with re- to: office ergonomics with good position of LE's; avoid sitting with ankle/knee crossed; avoid standing with knees hyperextended  Baseline:  09/28/23: improved awareness Goal status: MET   LONG TERM GOALS: Target date: 11/26/23   Decrease pain L knee by 80-100% allowing patient to return to all normal functional activities  Baseline:  09/28/23: 30% decrease  10/27/23: 80% decrease  Goal status: progressing  2.  5/5 strength bilat hips;knees; ankles  Baseline:  Goal status: partially met    3.  Improve single leg stance time on L LE to 10 or more seconds with no upper limb support  Baseline:  Goal status: MET  4.  Patient reports ability to sit at her desk for typical work  day with minima to no knee pain Baseline: almost constant pulling pain 09/28/23: 6 at worst  10/27/23: able to complete work day without knee pain  Goal status: MET  5.  Independent in HEP including aquatic therapy as indicated  Baseline:  Goal status: ongoing   6.  Improve LEFS Left knee by 10-15 points  Baseline:  Goal status: MET    7. Patient will report at least a 50% improvement in low back pain with seated activity to improve her tolerance to work activity.  Baseline: 8 at worst  Goal status: NEW  8. Patient will demonstrate proper core engagement with transfers and functional activity to reduce stress on her back.   Baseline: no knowledge, increased pain with sit to stand  Goal status: NEW  PLAN:  PT FREQUENCY: 1x/week  PT DURATION: 4 weeks  PLANNED INTERVENTIONS: 97164- PT Re-evaluation, 97110-Therapeutic exercises, 97530- Therapeutic activity, [id number]- Neuromuscular re-education, 97535- Self Care, [id number]- Manual therapy, [id number]- Gait training, [phone]- Electrical stimulation (unattended), [id number]- Electrical stimulation (manual), 20560 (1-2 muscles), 20561 (3+ muscles)- Dry Needling, Stair training, Taping, Joint mobilization, Cryotherapy, and Moist heat  PLAN FOR NEXT SESSION: continue DN as needed, continue hip and knee strength; Core stabilization.   Kristl Morioka, PT, DPT, ATC 12/01/23 7:52 AM

## 2024-01-24 DIAGNOSIS — H1045 Other chronic allergic conjunctivitis: Secondary | ICD-10-CM | POA: Diagnosis not present

## 2024-01-24 DIAGNOSIS — J301 Allergic rhinitis due to pollen: Secondary | ICD-10-CM | POA: Diagnosis not present

## 2024-01-24 DIAGNOSIS — J4520 Mild intermittent asthma, uncomplicated: Secondary | ICD-10-CM | POA: Diagnosis not present

## 2024-01-24 DIAGNOSIS — J3089 Other allergic rhinitis: Secondary | ICD-10-CM | POA: Diagnosis not present

## 2024-04-12 ENCOUNTER — Ambulatory Visit: Payer: BC Managed Care – PPO | Admitting: Family Medicine

## 2024-04-12 ENCOUNTER — Encounter: Payer: Self-pay | Admitting: Family Medicine

## 2024-04-12 ENCOUNTER — Ambulatory Visit: Payer: Self-pay | Admitting: Family Medicine

## 2024-04-12 VITALS — BP 126/72 | HR 90 | Temp 98.0°F | Ht 65.0 in | Wt 189.2 lb

## 2024-04-12 DIAGNOSIS — Z13220 Encounter for screening for lipoid disorders: Secondary | ICD-10-CM

## 2024-04-12 DIAGNOSIS — F17209 Nicotine dependence, unspecified, with unspecified nicotine-induced disorders: Secondary | ICD-10-CM | POA: Insufficient documentation

## 2024-04-12 DIAGNOSIS — Z720 Tobacco use: Secondary | ICD-10-CM | POA: Insufficient documentation

## 2024-04-12 DIAGNOSIS — R7309 Other abnormal glucose: Secondary | ICD-10-CM | POA: Insufficient documentation

## 2024-04-12 DIAGNOSIS — E663 Overweight: Secondary | ICD-10-CM | POA: Insufficient documentation

## 2024-04-12 DIAGNOSIS — Z0000 Encounter for general adult medical examination without abnormal findings: Secondary | ICD-10-CM

## 2024-04-12 DIAGNOSIS — E063 Autoimmune thyroiditis: Secondary | ICD-10-CM

## 2024-04-12 LAB — COMPREHENSIVE METABOLIC PANEL WITH GFR
ALT: 13 U/L (ref 3–35)
AST: 16 U/L (ref 5–37)
Albumin: 4.3 g/dL (ref 3.5–5.2)
Alkaline Phosphatase: 71 U/L (ref 39–117)
BUN: 12 mg/dL (ref 6–23)
CO2: 31 meq/L (ref 19–32)
Calcium: 9.6 mg/dL (ref 8.4–10.5)
Chloride: 105 meq/L (ref 96–112)
Creatinine, Ser: 0.72 mg/dL (ref 0.40–1.20)
GFR: 96.9 mL/min
Glucose, Bld: 94 mg/dL (ref 70–99)
Potassium: 4.8 meq/L (ref 3.5–5.1)
Sodium: 141 meq/L (ref 135–145)
Total Bilirubin: 0.5 mg/dL (ref 0.2–1.2)
Total Protein: 6.8 g/dL (ref 6.0–8.3)

## 2024-04-12 LAB — CBC
HCT: 44 % (ref 36.0–46.0)
Hemoglobin: 15 g/dL (ref 12.0–15.0)
MCHC: 34 g/dL (ref 30.0–36.0)
MCV: 94.1 fl (ref 78.0–100.0)
Platelets: 413 10*3/uL — ABNORMAL HIGH (ref 150.0–400.0)
RBC: 4.68 Mil/uL (ref 3.87–5.11)
RDW: 13.1 % (ref 11.5–15.5)
WBC: 8.5 10*3/uL (ref 4.0–10.5)

## 2024-04-12 LAB — LIPID PANEL
Cholesterol: 181 mg/dL (ref 28–200)
HDL: 51.5 mg/dL
LDL Cholesterol: 107 mg/dL — ABNORMAL HIGH (ref 10–99)
NonHDL: 129.33
Total CHOL/HDL Ratio: 4
Triglycerides: 113 mg/dL (ref 10.0–149.0)
VLDL: 22.6 mg/dL (ref 0.0–40.0)

## 2024-04-12 LAB — HEMOGLOBIN A1C: Hgb A1c MFr Bld: 5.9 % (ref 4.6–6.5)

## 2024-04-12 LAB — TSH: TSH: 1.89 u[IU]/mL (ref 0.35–5.50)

## 2024-04-12 NOTE — Progress Notes (Signed)
 "     Patient ID: Emily Mclean, female  DOB: 24-May-1970, 54 y.o.   MRN: [id number] Patient Care Team    Relationship Specialty Notifications Start End  Emily Mclean LABOR, DO PCP - General Family Medicine  12/10/15   Emily Mclean, Emily Mclean SQUIBB, Emily Mclean Consulting [facility]  11/15/18   Emily Mclean, Emily Mclean Consulting [facility]  03/28/19   Emily Mclean, Emily Mclean    11/02/19   Emily Luz, Emily Mclean Referring [facility]  02/15/20   Emily Lenis, Emily [facility] Consulting [facility]  03/27/21   Emily Mclean   03/27/21    Comment: thyroid     Chief Complaint  Patient presents with   Annual Exam    Pt is fasting,     Subjective: Emily Mclean is a 54 y.o.  Female  present for CPE.   All past medical history, surgical history, allergies, family history, immunizations, and social history were updated in the electronic medical record today. All recent labs, ED visits and hospitalizations within the last year were reviewed. Medication reconciliation completed today  Health maintenance:  Colonoscopy: No family history.  Colonoscopy completed 05/12/2021-Dr. Armbruster 5-year follow-up Mammogram: No family history [facility] breast cancer.  Mammogram completed 04/15/2023 at gynecology.> requested most recent record-again Cervical cancer screening: last pap: 04/12/2023, completed by:  Dr. Lenis Emily Immunizations: tdap UTD 2021, Influenza UTD 2025(encouraged yearly), shingrix  series completed Infectious disease screening: HIV completed , Hep C completed DEXA: Routine screening at age 42 Patient has a Dental home. Hospitalizations/ED visits: reviewed     04/12/2024    8:07 AM 08/26/2023    8:29 AM 07/08/2023   10:51 AM 03/30/2022    8:42 AM 03/27/2021    2:11 PM  Depression screen PHQ 2/9  Decreased Interest 0 0 0 0 0  Down, Depressed, Hopeless 0 0 0 0 1  PHQ - 2 Score 0 0 0 0 1  Altered sleeping 0    1  Tired, decreased energy 0    0  Change  in appetite 0    0  Feeling bad or failure about yourself  0    1  Trouble concentrating 0    0  Moving slowly or fidgety/restless 0    0  Suicidal thoughts 0    0  PHQ-9 Score 0    3   Difficult doing work/chores Not difficult at all         Data saved with a previous flowsheet row definition       11/04/2018    8:05 PM 07/08/2023   10:51 AM 04/12/2024    8:06 AM  Fall Risk  Falls in the past year?  0 0  Was there an injury with Fall?  0  0  Fall Risk Category Calculator  0 0  (RETIRED) Patient Fall Risk Level Low fall risk     Patient at Risk for Falls Due to  No Fall Risks No Fall Risks  Fall risk Follow up  Falls evaluation completed Falls evaluation completed     Data saved with a previous flowsheet row definition     Immunization History  Administered Date(s) Administered   INFLUENZA, HIGH DOSE SEASONAL PF 01/09/2016, 04/30/2016, 01/22/2019, 01/16/2020, 01/19/2021   Influenza Inj Mdck Quad Pf 12/23/2017, 12/24/2017   Influenza Split 12/07/2012   Influenza Whole 03/09/2007, 01/07/2008   Influenza, Mdck, Trivalent,PF 6+ MOS(egg free) 11/25/2023   Influenza, Seasonal, Injecte, Preservative Fre 01/06/2015   Influenza,inj,Quad PF,6+ Mos 12/22/2015,  01/05/2017   Influenza-Unspecified 01/11/2014, 12/05/2015, 12/06/2021, 11/26/2022   MMR 08/03/1991   Moderna Sars-Covid-2 Vaccination 11/26/2022, 11/25/2023   Novel Infuenza-h1n1-09 01/01/2008   PFIZER Comirnaty(Gray Top)Covid-19 Tri-Sucrose Vaccine 01/02/2021   PFIZER(Purple Top)SARS-COV-2 Vaccination 05/24/2019, 06/18/2019, 01/16/2020   PNEUMOCOCCAL CONJUGATE-20 04/12/2023   Pfizer(Comirnaty)Fall Seasonal Vaccine 12 years and older 01/09/2022   Pneumococcal-Unspecified 04/12/2023   Td 10/12/1991, 03/08/2006   Tdap 11/02/2019   Unspecified SARS-COV-2 Vaccination 05/24/2019, 06/18/2019, 01/02/2021, 01/09/2022, 11/26/2022   Varicella 08/18/2006, 09/22/2006   Zoster Recombinant(Shingrix ) 04/12/2023, 07/12/2023   Zoster, Live  04/12/2023    Past Medical History:  Diagnosis Date   Allergic rhinitis    Allergy    Chronic allergic conjunctivitis    Generalized abdominal pain 11/08/2018   GERD (gastroesophageal reflux disease)    Hallux valgus (acquired), left foot 03/28/2019   Heart murmur    Lumbar spondylosis 10/24/2023   Thyroid  disease    Urticaria due to cold and heat    Allergies  Allergen Reactions   Cefdinir Hives   Cefuroxime Axetil     REACTION: red rash   Cephalexin     REACTION: red rash   Sulfamethoxazole-Trimethoprim     Past Surgical History:  Procedure Laterality Date   MANDIBLE SURGERY     NASAL SINUS SURGERY     polyps removed   Family History  Problem Relation Age [facility] Onset   Diabetes Mother    Kidney disease Mother    Depression Mother    Allergic rhinitis Mother    Diabetes Father    Hypertension Father    Heart disease Father    Diabetes Brother    Hypertension Brother    Kidney disease Maternal Aunt    Depression Maternal Grandfather        suicide   Cancer Maternal Grandfather        lymphoma   Colon cancer Neg Hx    Colon polyps Neg Hx    Esophageal cancer Neg Hx    Stomach cancer Neg Hx    Rectal cancer Neg Hx    Social History   Social History Narrative   Live w/husband Futures Trader & 3 kids [place], [place], [place]).    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    Smokes daily. Rare use [facility] alcohol, no drugs.    Drinks some caffeine, uses herbal remedies.   Wears her seatbelt, bicycle helmet, smoke detector in the home.    Exercises routinely.    Feels safe in her relationships.     Allergies as [facility]clean 04/12/2024       Reactions   Cefdinir Hives   Cefuroxime Axetil    REACTION: red rash   Cephalexin    REACTION: red rash   Sulfamethoxazole-trimethoprim          Medication List        Accurate as Mclean April 12, 2024  8:36 AM. If you have any questions, ask your nurse or doctor.          STOP taking these medications    albuterol 108 (90 Base)  MCG/ACT inhaler Commonly known as: VENTOLIN HFA Stopped by: Mclean Bellini, DO   azelastine 0.05 % ophthalmic solution Commonly known as: OPTIVAR Stopped by: Mclean Bellini, DO   Azelastine HCl 137 MCG/SPRAY Soln Stopped by: Mclean Bellini, DO   fluticasone 50 MCG/ACT nasal spray Commonly known as: FLONASE Stopped by: Mclean Bellini, DO       TAKE these medications    [facility] Thyroid  60 MG tablet Generic drug: thyroid  Take  60 mg by mouth daily.   diclofenac  Sodium 1 % Gel Commonly known as: VOLTAREN  Apply 4 g topically 4 (four) times daily. To affected joint.   levocetirizine 5 MG tablet Commonly known as: XYZAL Take 5 mg by mouth at bedtime.   montelukast 10 MG tablet Commonly known as: SINGULAIR Take 10 mg by mouth at bedtime.   naproxen  500 MG tablet Commonly known as: NAPROSYN  Take 1 tablet (500 mg total) by mouth 2 (two) times daily with a meal.   Vitamin D  50 MCG (2000 UT) Caps Take by mouth.        All past medical history, surgical history, allergies, family history, immunizations andmedications were updated in the EMR today and reviewed under the history and medication portions [facility] their EMR.     No results found for this or any previous visit (from the past 2160 hours).    Review [facility] Systems  Constitutional: Negative.   HENT: Negative.    Eyes: Negative.   Respiratory: Negative.    Cardiovascular: Negative.   Gastrointestinal: Negative.   Genitourinary: Negative.   Musculoskeletal: Negative.   Skin: Negative.   Neurological: Negative.   Endo/Heme/Allergies: Negative.   Psychiatric/Behavioral: Negative.    All other systems reviewed and are negative.  14 pt review [facility] systems performed and negative (unless mentioned in an HPI)  Objective: BP 126/72   Pulse 90   Temp 98 F (36.7 C)   Ht 5' 5 (1.651 m)   Wt 189 lb 3.2 oz (85.8 kg)   SpO2 98%   BMI 31.48 kg/m  Physical Exam Vitals and nursing note reviewed.  Constitutional:      General:  She is not in acute distress.    Appearance: Normal appearance. She is not ill-appearing or toxic-appearing.  HENT:     Head: Normocephalic and atraumatic.     Right Ear: Tympanic membrane, ear canal and external ear normal. There is no impacted cerumen.     Left Ear: Tympanic membrane, ear canal and external ear normal. There is no impacted cerumen.     Nose: No congestion or rhinorrhea.     Mouth/Throat:     Mouth: Mucous membranes are moist.     Pharynx: Oropharynx is clear. No oropharyngeal exudate or posterior oropharyngeal erythema.  Eyes:     General: No scleral icterus.       Right eye: No discharge.        Left eye: No discharge.     Extraocular Movements: Extraocular movements intact.     Conjunctiva/sclera: Conjunctivae normal.     Pupils: Pupils are equal, round, and reactive to light.  Cardiovascular:     Rate and Rhythm: Normal rate and regular rhythm.     Pulses: Normal pulses.     Heart sounds: Normal heart sounds. No murmur heard.    No friction rub. No gallop.  Pulmonary:     Effort: Pulmonary effort is normal. No respiratory distress.     Breath sounds: Normal breath sounds. No stridor. No wheezing, rhonchi or rales.  Chest:     Chest wall: No tenderness.  Abdominal:     General: Abdomen is flat. Bowel sounds are normal. There is no distension.     Palpations: Abdomen is soft. There is no mass.     Tenderness: There is no abdominal tenderness. There is no right CVA tenderness, left CVA tenderness, guarding or rebound.     Hernia: No hernia is present.  Musculoskeletal:  General: No swelling, tenderness or deformity. Normal range Mclean motion.     Cervical back: Normal range Mclean motion and neck supple. No rigidity or tenderness.     Right lower leg: No edema.     Left lower leg: No edema.  Lymphadenopathy:     Cervical: No cervical adenopathy.  Skin:    General: Skin is warm and dry.     Coloration: Skin is not jaundiced or pale.     Findings: No  bruising, erythema, lesion or rash.  Neurological:     General: No focal deficit present.     Mental Status: She is alert and oriented to person, place, and time. Mental status is at baseline.     Cranial Nerves: No cranial nerve deficit.     Sensory: No sensory deficit.     Motor: No weakness.     Coordination: Coordination normal.     Gait: Gait normal.     Deep Tendon Reflexes: Reflexes normal.  Psychiatric:        Mood and Affect: Mood normal.        Behavior: Behavior normal.        Thought Content: Thought content normal.        Judgment: Judgment normal.     No results found.  Assessment/plan: Emily Mclean is a 54 y.o. female present for CPE  Routine general medical examination at a health care facility Patient was encouraged to exercise greater than 150 minutes a week. Patient was encouraged to choose a diet filled with fresh fruits and vegetables, and lean meats. AVS provided to patient today for education/recommendation on gender specific health and safety maintenance. Colonoscopy: No family history.  Colonoscopy completed 05/12/2021-Dr. Armbruster 5-year follow-up Mammogram: No family history [facility] breast cancer.  Mammogram completed 04/15/2023 at gynecology.> requested most recent record-again Cervical cancer screening: last pap: 04/12/2023, completed by:  Dr. Alm Cook Immunizations: tdap UTD 2021, Influenza UTD 2025(encouraged yearly), shingrix  series completed Infectious disease screening: HIV completed , Hep C completed DEXA: Routine screening at age 89 Tobacco use disorder: Smoking cessation encouraged Report under 20 pack years, but may be available for lung cancer screening next year with physical. Lipid screening - Lipid panel Hypothyroidism due to Hashimoto thyroiditis Managed by [facility]  - Lipid panel - TSH Elevated hemoglobin A1c - Hemoglobin A1c - Lipid panel E66.3 (BMI 25.0-29.9) - Hemoglobin A1c - Lipid panel  Return in about 1 year (around  04/13/2025) for cpe (20 min).  Orders Placed This Encounter  Procedures   [facility] MAMMOGRAPHY   CBC   Comprehensive metabolic panel with GFR   Hemoglobin A1c   Lipid panel   TSH   [facility] PAP SMEAR   No orders [facility] the defined types were placed in this encounter.  Referral Orders  No referral(s) requested today     Electronically signed by: [facility]clean Bellini, DO [facility] "

## 2024-04-12 NOTE — Patient Instructions (Addendum)

## 2025-04-15 ENCOUNTER — Encounter: Admitting: Family Medicine
